# Patient Record
Sex: Female | Born: 1995 | Race: Black or African American | Hispanic: No | Marital: Single | Smoking: Never smoker
Health system: Southern US, Community
[De-identification: ages and names within clinical notes are randomized; demographics above are authoritative.]

## PROBLEM LIST (undated history)

## (undated) DIAGNOSIS — I10 Essential (primary) hypertension: Secondary | ICD-10-CM

## (undated) DIAGNOSIS — E669 Obesity, unspecified: Secondary | ICD-10-CM

## (undated) DIAGNOSIS — T7840XA Allergy, unspecified, initial encounter: Secondary | ICD-10-CM

## (undated) DIAGNOSIS — J45909 Unspecified asthma, uncomplicated: Secondary | ICD-10-CM

## (undated) DIAGNOSIS — L309 Dermatitis, unspecified: Secondary | ICD-10-CM

## (undated) HISTORY — PX: MOUTH SURGERY: SHX715

---
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
## 2015-12-26 ENCOUNTER — Emergency Department (HOSPITAL_COMMUNITY)
Admission: EM | Admit: 2015-12-26 | Discharge: 2015-12-26 | Disposition: A | Discharge status: Home / Self Care | Payer: Medicaid Other | Attending: Emergency Medicine | Admitting: Emergency Medicine

## 2015-12-26 ENCOUNTER — Encounter (HOSPITAL_COMMUNITY): Payer: Self-pay | Admitting: Nurse Practitioner

## 2015-12-26 ENCOUNTER — Emergency Department (HOSPITAL_BASED_OUTPATIENT_CLINIC_OR_DEPARTMENT_OTHER): Admit: 2015-12-26 | Discharge: 2015-12-26 | Disposition: A | Discharge status: Home / Self Care | Payer: Medicaid Other

## 2015-12-26 DIAGNOSIS — R21 Rash and other nonspecific skin eruption: Secondary | ICD-10-CM

## 2015-12-26 DIAGNOSIS — M7989 Other specified soft tissue disorders: Secondary | ICD-10-CM | POA: Diagnosis not present

## 2015-12-26 DIAGNOSIS — R000 Tachycardia, unspecified: Secondary | ICD-10-CM | POA: Insufficient documentation

## 2015-12-26 DIAGNOSIS — M79609 Pain in unspecified limb: Secondary | ICD-10-CM | POA: Diagnosis not present

## 2015-12-26 HISTORY — DX: Dermatitis, unspecified: L30.9

## 2015-12-26 MED ORDER — TRIAMCINOLONE ACETONIDE 0.1 % EX CREA: 1.0000 "application " | TOPICAL_CREAM | Freq: Two times a day (BID) | CUTANEOUS | Status: DC

## 2015-12-26 NOTE — ED Provider Notes (Signed)
CSN: [id number]     Arrival date & time 12/26/15  1527 History  By signing my name below, I, [facility], attest that this documentation has been prepared under the direction and in the presence of Amber MeresAshley Leigh Kaeding, PA-C. Electronically Signed: Randell PatientMarrissa Arroyo, ED Scribe. 12/26/2015. 6:08 PM.  Chief Complaint  Arroyo presents with  . Skin Problem    The history is provided by the Arroyo. No language interpreter was used.   HPI Comments: Amber Arroyo is a 20 y.o. female who presents to the Emergency Department complaining of constant, waxing and waning, pruritic, scaly rash ongoing for months, worse in the past week on her lower extremities - worse on right compared to left. She describes the rash as dry, scaly, and erythematous. Pt states that she has eczema with recurrent flare-ups in the summer months but has not been able to follow-up with a dermatologist due to her provider moving away. She reports small breaks in her skin; however, denies any purulent discharge. She notes that the rash worsens from pruritic to a burning sensation when she showers and applies Gold-Bond topical cream. She has taken Aleve. She notes similar symptoms in the past that improved with triamcinolone but has been unable to refill this prescription. Per pt, she has switched lotions multiple times over the past few weeks.  Of note, She reports associated painful, bilateral lower leg edema, worse on the right, She frequently stands for long periods of time while at work. Denies recent injuries, long-distance travel, hospitalizations, surgeries, hormone therapy, or tick bites. Denies hx of cancer, DVT, or PE. No OCPs. Denies cough, hemoptysis, SOB, CP, fevers, chills, and night diaphoresis.  Past Medical History  Diagnosis Date  . Obesity   . Allergy     eczema, seasonal allergies  . Eczema    Past Surgical History  Procedure Laterality Date  . Mouth surgery      Wisdom Teeth Extraction  . Incision and drainage  abscess Left 02/04/2013    Procedure: INCISION AND DRAINAGE POSTERIOR NECK ABSCESS;  Surgeon: Amber ShanksKarol Wolicki, MD;  [facility];  Service: ENT;  Laterality: Left;   Family History  Problem Relation Age of Onset  . Hypertension Mother   . Asthma Mother   . Eczema Mother   . Hypertension Father    Social History  Substance Use Topics  . Smoking status: Never Smoker   . Smokeless tobacco: Never Used  . Alcohol Use: No   OB History    No data available     Review of Systems  Constitutional: Negative for fever, chills and diaphoresis.  Respiratory: Negative for cough and shortness of breath.   Cardiovascular: Positive for leg swelling. Negative for chest pain.  Musculoskeletal: Positive for myalgias ( right lower extremity).  Skin: Positive for color change, rash and wound.      Allergies  Review of Arroyo's allergies indicates no known allergies.  Home Medications   Prior to Admission medications   Medication Sig Start Date End Date Taking? Authorizing Provider  acetaminophen (TYLENOL) 325 MG tablet Take 2 tablets (650 mg total) by mouth every 6 (six) hours as needed (mild pain, fever >100.4). 02/06/13   Tiana LoftMelissa Cabellon, MD  clindamycin (CLEOCIN) 300 MG capsule Take 2 capsules (600 mg total) by mouth every 8 (eight) hours. 02/06/13   Tiana LoftMelissa Cabellon, MD  fluticasone (FLONASE) 50 MCG/ACT nasal spray Place 2 sprays into the nose daily. 11/02/12   Sharene SkeansShad Baab, MD  loratadine (CLARITIN) 10 MG tablet Take 10  mg by mouth daily.    Historical Provider, MD  oxyCODONE (OXY IR/ROXICODONE) 5 MG immediate release tablet Take 1 tablet (5 mg total) by mouth every 6 (six) hours as needed. 02/06/13   Tiana LoftMelissa Cabellon, MD  triamcinolone cream (KENALOG) 0.1 % Apply 1 application topically 2 (two) times daily. 12/26/15   Lona KettleAshley Laurel Marshelle Bilger, PA-C   BP 135/79 mmHg  Pulse 105  Temp(Src) 98.6 F (37 C) (Oral)  Resp 20  SpO2 98% Physical Exam  Constitutional: She appears well-developed and  well-nourished. No distress.  HENT:  Head: Normocephalic and atraumatic.  Eyes: Conjunctivae are normal. No scleral icterus.  Neck: Normal range of motion.  Pulmonary/Chest: Effort normal. No respiratory distress.  Musculoskeletal: She exhibits edema and tenderness.  B/l leg swelling, right > left. Posterior right calf tenderness.   Neurological: She is alert.  Skin: Skin is warm and dry. She is not diaphoretic. There is erythema.  Bilateral dry, scaly plaques with lichenification from the feet to the ankles with some breaks in skin. Few plaques on the bilateral hands. No obvious signs of infection. Cap refill less than 2 seconds.  Psychiatric: She has a normal mood and affect. Her behavior is normal.    ED Course  Procedures   DIAGNOSTIC STUDIES: Oxygen Saturation is 99% on RA, normal by my interpretation.    COORDINATION OF CARE: 4:02 PM Will prescribe triamcinolone cream. Will order vascular US of right lower leg to r/o DVT. Discussed treatment plan with pt at bedside and pt agreed to plan.  6:07 PM Reviewed results of right lower leg imaging. Returned to discuss results with pt. Will discharge pt.   MDM   Final diagnoses:  Rash    Pt is afebrile and non-toxic appearing in NAD. She is tachycardic, vital signs otherwise stable. Physical exam remarkable for b/l lower extremity swelling, right > left with pain in posterior calf - lower extremity doppler negative for DVT. Physical exam remarkable for eczematous plaques on feet b/l. No purulent discharge. Neurovascularly intact. Suspect eczematous flare. Discussed symptomatic management to include topical skin barriers, benadryl for itch relief, and Rx triamcinolone cream. Encouraged pt to establish a PCP and provided resources. Return precautions discussed. Pt voiced understanding and is agreeable.    I personally performed the services described in this documentation, which was scribed in my presence. The recorded information has  been reviewed and is accurate.    Lona Kettleshley Laurel Jonan Seufert, PA-C 12/27/15 0234  Richardean Canalavid H Yao, MD 12/27/15 1140

## 2015-12-26 NOTE — Discharge Instructions (Signed)
Read the information below.   Your imaging study of your lower extremity with negative for a blood clot. However, it did show some enlarged lymph nodes. It is important that you have this further evaluated. I encourage you to follow up with a primary care provider. I have provided the contact information for [facility] as well as other resources for local providers. Please call to schedule a follow up appointment.  Be sure to avoid triggers that cause your eczema to flare up. You can take over the counter benadryl for relief of itching.  Keep your skin clean an dry and apply topical  I have prescribed triamcinolone cream. Apply to affected areas twice daily.  Use the prescribed medication as directed.  Please discuss all new medications with your pharmacist.   You may return to the Emergency Department at any time for worsening condition or any new symptoms that concern you. Return to ED if your symptoms worsen, you develop fever, you have purulent drainage, or develop open sores.    [employer] Resource Guide Financial Assistance The United Ways 211 is a great source of information about community services available.  Access by dialing 2-1-1 from anywhere in [place], or by website -  PooledIncome.plwww.nc211.org.   Other Local Resources (Updated 06/2015)  Financial Assistance   Services    Phone Number and Address  [facility]  Low-cost medical care - 1st and 3rd Saturday of every month  Must not qualify for public or private insurance and must have limited income [phone] [address] [place], [place]    [place] [facility] Social Services  Child care  Emergency assistance for housing and Kimberly-Clarkutilities  Food stamps  Medicaid [phone] [address] [place], [place] [address]   [facility]  Low-cost medical care for children, communicable diseases, sexually-transmitted diseases, immunizations, maternity  care, womens health and family planning [phone] [address] [place], [place] [address]  [facility]   Medication assistance for [place] [place] residents  Must meet income requirements [phone] [address] [place], [place].    [facility]  Child care  Emergency assistance for housing and Kimberly-Clarkutilities  Food stamps  Medicaid [phone] [address] [place], [place] [address]  [facility]   Low-cost medical care,   Monday through Friday, 9 am to 6 pm.   Accepts Medicare/Medicaid, and self-pay [phone] [place], [place] [address]  [facility]  Low-cost medical care - Monday through Friday, 8:30 am - 5:30 pm  Accepts Medicaid and self-pay [phone] [address] [place], [place] [address]   [facility]  Primary medical care, including for those with sickle cell disease  Accepts Medicare, Medicaid, insurance and self-pay [phone] [address] [place], [place]  [facility]   Primary medical care  Accepts Medicare, [place], insurance and self-pay [phone] [address] [place], [place] [address]   [facility] of Social Services  Child care  Emergency assistance for housing and Kimberly-Clarkutilities  Food stamps  Medicaid [phone] [address] [place], [place] [address]  [facility] of Health and [employer]Human Services  Child care  Emergency assistance for housing and Kimberly-Clarkutilities  Food stamps  Medicaid [phone] [address] [place], [place] [address]   [facility] Med[facility]  Medication assistance for [facility] residents with no insurance  only  Must have a primary care doctor [phone] [address], [address] [place], [place]  [facility]    Primary medical care  [place] Medicare, [place]icaid, insurance  [phone] [address] [place], [place]  [facility]ist   Medication assistance [phone]  [facility]   Primary medical care  Accepts Medicare, [place]icaid, insurance and self-pay [phone] [address] [place], [place] [address]  [facility]   Primary medical care  Accepts Medicare, [place]icaid, insurance and self-pay [phone] [address] [place], [place] [address]  [facility]  For [place] residents between the ages of 3518 and 5164 who do not have any form of health insurance, Medicare, [place]icaid, or [place] benefits.  Services are provided free of charge to uninsured patients who fall within federal poverty guidelines.    Hours: Tuesdays and Thursdays, 4:15 - 8 pm [phone] [address], [address] [place], [place] [address]  [facility]     Primary medical care  Dental care  Nutritional counseling  Pharmacy  Accepts Medicaid, Medicare, most insurance.  Fees are adjusted based on ability to pay.   [phone] [facility] [address] [place], [place]  [phone] [facility] [address] [place], [place]  [phone] [facility] [place], [place]  [phone] [facility], [address] [place], [place]  [phone] [facility] [address] [place], [place]  [facility]  Womens health and family planning [phone] [address] [place], [place]  [facility] Department of Social Services  Child care  Emergency assistance for housing and Kimberly-Clarkutilities  Food stamps  Medicaid [phone] [address], [place], [place] [address]   [facility]    Ages 2918 and older  Hours: Mondays and Thursdays, 7:00 am - 9:00 am Patients are seen on a first come,  first served basis. [phone], [phone] [place], [place]  [facility] Division of Social Services  Child care  Emergency assistance for housing and Kimberly-Clarkutilities  Food stamps  Medicaid [phone] [address] [place], [place] [address]  [facility] [employer]  Medication assistance  Rental assistance  Food pantry  Medication assistance  Housing assistance  Emergency food distribution  Utility assistance [phone] [address] [place], [place]  [phone]  [address] [place], [place] [address] Hours: Tuesdays and Thursdays from 9am - 12 noon by appointment only  [phone] [address] [place], [place] [address]  [facility] - Lanae Boastlara F. Gunn   Accepts private insurance, [place] IslandMedicare, and [place]icaid.  Payment is based on a sliding scale for those without insurance.  Hours: Mondays, Tuesdays and Thursdays, 8:30 am - 5:30 pm.   [phone] [address] [place], [place]  [facility] - Family Medicine at [facility]    Accepts private insurance, [place]icare, and [place]icaid.  Payment is based on a sliding scale for those without insurance. [phone] [address] [place], [place]  [facility] - Pediatrics at E. Scientist, research (physical sciences)[place]  Accepts private insurance, [employer] EntertainmentMedicare, and [place]icaid.  Payment is based on a sliding scale for those without insurance [phone] [address], [place], [place]  [facility] - Pediatrics at [place]  Accepts private insurance, [place]icare, and [place]icaid.  Payment is based on a sliding scale for those without insurance. [phone] [address] [place], [place]  [facility] - Pediatrics at [facility], [place], and [place].  Payment is based on a sliding scale for those without insurance. [phone], [phone] [place], [place].     [facility]  Maternity care.  Accepts Medicaid and self-pay. [phone] [address] [place], [place]

## 2015-12-26 NOTE — ED Notes (Signed)
Pt c/o 1 week history of skin irritation, swelling, dryness, redness. She has hx eczema but can not go to dermatologist due to insurance issues. She has been applying vaseline with no relief.

## 2015-12-26 NOTE — Progress Notes (Signed)
VASCULAR LAB PRELIMINARY  PRELIMINARY  PRELIMINARY  PRELIMINARY  Right lower extremity venous duplex completed.    Preliminary report:  There is no DVT or SVT noted in the right lower extremity.  There are a significant number of enlarged lymph nodes noted throughout the right groin and proximal thigh.    Carlena Ruybal, RVT 12/26/2015, 5:02 PM

## 2016-01-01 ENCOUNTER — Emergency Department (HOSPITAL_BASED_OUTPATIENT_CLINIC_OR_DEPARTMENT_OTHER): Admit: 2016-01-01 | Discharge: 2016-01-01 | Disposition: A | Discharge status: Home / Self Care | Payer: Medicaid Other

## 2016-01-01 ENCOUNTER — Emergency Department (HOSPITAL_COMMUNITY): Payer: Medicaid Other

## 2016-01-01 ENCOUNTER — Encounter (HOSPITAL_COMMUNITY): Payer: Self-pay

## 2016-01-01 ENCOUNTER — Emergency Department (HOSPITAL_COMMUNITY)
Admission: EM | Admit: 2016-01-01 | Discharge: 2016-01-01 | Disposition: A | Discharge status: Home / Self Care | Payer: Medicaid Other | Attending: Emergency Medicine | Admitting: Emergency Medicine

## 2016-01-01 DIAGNOSIS — M7989 Other specified soft tissue disorders: Secondary | ICD-10-CM

## 2016-01-01 LAB — CBC WITH DIFFERENTIAL/PLATELET
BASOS PCT: 0 %
Basophils Absolute: 0 10*3/uL (ref 0.0–0.1)
Eosinophils Absolute: 0.2 10*3/uL (ref 0.0–0.7)
Eosinophils Relative: 3 %
HEMATOCRIT: 32.1 % — AB (ref 36.0–46.0)
HEMOGLOBIN: 10.1 g/dL — AB (ref 12.0–15.0)
LYMPHS ABS: 1.4 10*3/uL (ref 0.7–4.0)
Lymphocytes Relative: 16 %
MCH: 25.6 pg — AB (ref 26.0–34.0)
MCHC: 31.5 g/dL (ref 30.0–36.0)
MCV: 81.5 fL (ref 78.0–100.0)
MONO ABS: 1.1 10*3/uL — AB (ref 0.1–1.0)
MONOS PCT: 13 %
NEUTROS ABS: 5.8 10*3/uL (ref 1.7–7.7)
Neutrophils Relative %: 68 %
Platelets: 350 10*3/uL (ref 150–400)
RBC: 3.94 MIL/uL (ref 3.87–5.11)
RDW: 15.2 % (ref 11.5–15.5)
WBC: 8.5 10*3/uL (ref 4.0–10.5)

## 2016-01-01 LAB — COMPREHENSIVE METABOLIC PANEL
ALBUMIN: 2.9 g/dL — AB (ref 3.5–5.0)
ALK PHOS: 47 U/L (ref 38–126)
ALT: 26 U/L (ref 14–54)
ANION GAP: 7 (ref 5–15)
AST: 26 U/L (ref 15–41)
BUN: 13 mg/dL (ref 6–20)
CALCIUM: 8.8 mg/dL — AB (ref 8.9–10.3)
CHLORIDE: 105 mmol/L (ref 101–111)
CO2: 26 mmol/L (ref 22–32)
Creatinine, Ser: 0.72 mg/dL (ref 0.44–1.00)
GFR calc Af Amer: >60 mL/min (ref >60)
GFR calc non Af Amer: >60 mL/min (ref >60)
GLUCOSE: 84 mg/dL (ref 65–99)
Potassium: 3.8 mmol/L (ref 3.5–5.1)
SODIUM: 138 mmol/L (ref 135–145)
Total Bilirubin: 0.6 mg/dL (ref 0.3–1.2)
Total Protein: 7.2 g/dL (ref 6.5–8.1)

## 2016-01-01 LAB — URINALYSIS, ROUTINE W REFLEX MICROSCOPIC
Bilirubin Urine: NEGATIVE
GLUCOSE, UA: NEGATIVE mg/dL
HGB URINE DIPSTICK: NEGATIVE
Ketones, ur: NEGATIVE mg/dL
LEUKOCYTES UA: NEGATIVE
Nitrite: NEGATIVE
PH: 6.5 (ref 5.0–8.0)
Protein, ur: NEGATIVE mg/dL
SPECIFIC GRAVITY, URINE: 1.028 (ref 1.005–1.030)

## 2016-01-01 LAB — POC URINE PREG, ED: PREG TEST UR: NEGATIVE

## 2016-01-01 LAB — I-STAT CG4 LACTIC ACID, ED: Lactic Acid, Venous: 1.34 mmol/L (ref 0.5–1.9)

## 2016-01-01 MED ORDER — CEPHALEXIN 250 MG PO CAPS
500.0000 mg | ORAL_CAPSULE | Freq: Once | ORAL | Status: AC
  Administered 2016-01-01: 500 mg via ORAL
  Filled 2016-01-01: qty 2

## 2016-01-01 MED ORDER — NAPROXEN 500 MG PO TABS: 500.0000 mg | ORAL_TABLET | Freq: Two times a day (BID) | ORAL | Status: DC

## 2016-01-01 MED ORDER — CEPHALEXIN 500 MG PO CAPS: 500.0000 mg | ORAL_CAPSULE | Freq: Four times a day (QID) | ORAL | Status: DC

## 2016-01-01 NOTE — Progress Notes (Signed)
*  Preliminary Results* Bilateral lower extremity venous duplex completed. Study was very technically difficult and limited due to poor patient cooperation and patient body habitus. Unable to perform compression maneuvers due to poor patient cooperation. There is no obvious evidence of deep vein thrombosis involving the visualized veins of bilateral lower extremities by color doppler.   Incidental finding: There are multiple heterogenous areas of bilateral groins, largest measuring 4.9cm, suggestive of possible enlarged lymph nodes.  Preliminary results discussed with Harolyn RutherfordShawn Joy, PA-C.  01/01/2016  Gertie FeyMichelle Peggyann Zwiefelhofer, RVT, RDCS, RDMS

## 2016-01-01 NOTE — ED Provider Notes (Signed)
CSN: [id number]     Arrival date & time 01/01/16  1350 History   First MD Initiated Contact with Patient 01/01/16 1528     Chief Complaint  Patient presents with  . Leg Swelling     (Consider location/radiation/quality/duration/timing/severity/associated sxs/prior Treatment) HPI    Amber Arroyo is a 20 y.o. female, with a history of Eczema and obesity, presenting to the ED with Leg swelling, increasing over the last week. Patient states that her right leg began to swell and turn red, worse than the left. Patient has a history of eczema and states that the redness originally started with increased itching on both her legs. Over the last week, patient states her leg stopped itching and her right one became painful. The swelling and redness continues to increase in the right leg. Patient was evaluated on July 6, had a negative duplex ultrasound, diagnosed with eczema flare, and prescribed triamcinolone. Patient was encouraged to seek out care from a PCP at that time, but did not do so. Patient denies neuro deficits, falls/trauma, fever/chills, or any other complaints.     Past Medical History  Diagnosis Date  . Obesity   . Allergy     eczema, seasonal allergies  . Eczema    Past Surgical History  Procedure Laterality Date  . Mouth surgery      Wisdom Teeth Extraction  . Incision and drainage abscess Left 02/04/2013    Procedure: INCISION AND DRAINAGE POSTERIOR NECK ABSCESS;  Surgeon: Flo ShanksKarol Wolicki, MD;  [facility];  Service: ENT;  Laterality: Left;   Family History  Problem Relation Age of Onset  . Hypertension Mother   . Asthma Mother   . Eczema Mother   . Hypertension Father    Social History  Substance Use Topics  . Smoking status: Never Smoker   . Smokeless tobacco: Never Used  . Alcohol Use: No   OB History    No data available     Review of Systems  Constitutional: Negative for fever, chills and diaphoresis.  Respiratory: Negative for shortness of breath.    Cardiovascular: Positive for leg swelling. Negative for chest pain.  Gastrointestinal: Negative for abdominal pain.  Genitourinary: Negative for flank pain and difficulty urinating.  Musculoskeletal: Positive for myalgias (right leg). Negative for back pain and neck pain.  Skin: Negative for color change and pallor.  Neurological: Negative for dizziness, syncope, weakness, light-headedness and headaches.  All other systems reviewed and are negative.     Allergies  Review of patient's allergies indicates no known allergies.  Home Medications   Prior to Admission medications   Medication Sig Start Date End Date Taking? Authorizing Provider  acetaminophen (TYLENOL) 325 MG tablet Take 2 tablets (650 mg total) by mouth every 6 (six) hours as needed (mild pain, fever >100.4). 02/06/13   Tiana LoftMelissa Cabellon, MD  cephALEXin (KEFLEX) 500 MG capsule Take 1 capsule (500 mg total) by mouth 4 (four) times daily. 01/01/16   Shawn C Joy, PA-C  clindamycin (CLEOCIN) 300 MG capsule Take 2 capsules (600 mg total) by mouth every 8 (eight) hours. 02/06/13   Tiana LoftMelissa Cabellon, MD  fluticasone (FLONASE) 50 MCG/ACT nasal spray Place 2 sprays into the nose daily. 11/02/12   Sharene SkeansShad Baab, MD  loratadine (CLARITIN) 10 MG tablet Take 10 mg by mouth daily.    Historical Provider, MD  naproxen (NAPROSYN) 500 MG tablet Take 1 tablet (500 mg total) by mouth 2 (two) times daily. 01/01/16   Anselm PancoastShawn C Joy, PA-C  oxyCODONE (  OXY IR/ROXICODONE) 5 MG immediate release tablet Take 1 tablet (5 mg total) by mouth every 6 (six) hours as needed. 02/06/13   Tiana Loft, MD  triamcinolone cream (KENALOG) 0.1 % Apply 1 application topically 2 (two) times daily. 12/26/15   Lona Kettle, PA-C   BP 141/85 mmHg  Pulse 60  Temp(Src) 97.8 F (36.6 C) (Oral)  Resp 18  Ht 5\' 3"  (1.6 m)  Wt 108.228 kg  BMI 42.28 kg/m2  SpO2 100% Physical Exam  Constitutional: She appears well-developed and well-nourished. No distress.  HENT:  Head:  Normocephalic and atraumatic.  Eyes: Conjunctivae are normal.  Neck: Neck supple.  Cardiovascular: Normal rate, regular rhythm, normal heart sounds and intact distal pulses.   Pulmonary/Chest: Effort normal and breath sounds normal. No respiratory distress.  Abdominal: Soft. There is no tenderness. There is no guarding.  Musculoskeletal: She exhibits edema and tenderness.  Severe, pitting edema in the right foot and lower leg. Erythema and tenderness extends from the right foot into the right thigh. Full active and passive range of motion bilaterally.  Lymphadenopathy:    She has no cervical adenopathy.  Neurological: She is alert. She has normal reflexes.  No sensory deficits. Strength 5/5 in all extremities.   Skin: Skin is warm and dry. She is not diaphoretic.  Psychiatric: She has a normal mood and affect. Her behavior is normal.  Nursing note and vitals reviewed.   ED Course  Procedures (including critical care time) Labs Review Labs Reviewed  COMPREHENSIVE METABOLIC PANEL - Abnormal; Notable for the following:    Calcium 8.8 (*)    Albumin 2.9 (*)    All other components within normal limits  CBC WITH DIFFERENTIAL/PLATELET - Abnormal; Notable for the following:    Hemoglobin 10.1 (*)    HCT 32.1 (*)    MCH 25.6 (*)    Monocytes Absolute 1.1 (*)    All other components within normal limits  URINALYSIS, ROUTINE W REFLEX MICROSCOPIC (NOT AT [facility])  I-STAT CG4 LACTIC ACID, ED  POC URINE PREG, ED    Imaging Review Dg Tibia/fibula Right  01/01/2016  CLINICAL DATA:  Increasing edema, redness and itching in RIGHT lower leg and foot for 2 weeks, history ax stoma EXAM: RIGHT TIBIA AND FIBULA - 2 VIEW COMPARISON:  None FINDINGS: Diffuse soft tissue swelling. Osseous mineralization normal. Knee and ankle joint alignments normal. No acute fracture, dislocation, or bone destruction. IMPRESSION: Diffuse soft tissue swelling. No acute osseous abnormalities. Electronically Signed   By: Ulyses Southward M.D.   On: 01/01/2016 17:09   Dg Foot Complete Right  01/01/2016  CLINICAL DATA:  20 year old female with right foot pain and swelling for 2 weeks. EXAM: RIGHT FOOT COMPLETE - 3+ VIEW COMPARISON:  None. FINDINGS: Diffuse soft tissue swelling is noted. There is no evidence of fracture, subluxation or dislocation. No focal bony lesions are identified. No radiopaque foreign bodies are noted. IMPRESSION: Soft tissue swelling without bony abnormality. Electronically Signed   By: Harmon Pier M.D.   On: 01/01/2016 17:05   I have personally reviewed and evaluated these images and lab results as part of my medical decision-making.   EKG Interpretation None      MDM   Final diagnoses:  Leg swelling    Amber Arroyo presents with leg swelling, right more than the left.  Findings and plan of care discussed with Raeford Razor, MD and then with Dr. Verdie Mosher after EDP shift change.  Pt has large inguinal lymph nodes  bilaterally noted on ultrasound. A trial of antibiotics for possible cellulitis and patient will need close PCP follow-up. Needs case management for assistance in securing a PCP. Appointment was set up for the patient for next week. The importance of keeping this appointment was stressed to the patient. Return precautions discussed. Patient voiced understanding of her instructions, agrees to the plan, and is comfortable with discharge.  Filed Vitals:   01/01/16 1545 01/01/16 1615 01/01/16 1737 01/01/16 1745  BP: 140/80 131/74 133/70 126/78  Pulse: 62 69 79   Temp:      TempSrc:      Resp:   18   Height:      Weight:      SpO2: 100% 100% 100%       Anselm Pancoast, PA-C 01/01/16 1900  Lavera Guise, MD 01/02/16 1145

## 2016-01-01 NOTE — ED Notes (Signed)
Patient here for further evaluation of right leg swelling. Seen on the 6th for same and had doppler and was negative for DVT. Denies trauma. Pain with ambulation

## 2016-01-01 NOTE — Discharge Instructions (Signed)
You have been seen today for leg swelling. There were no blood clots noted on your ultrasound, but abnormally large lymph nodes were found. You will need to follow up with a PCP for further evaluation and management. Keep the appointment set up for you on Tuesday 7/18 at 11:30 with Dr. Venetia NightAmao.  You are also being prescribed an antibiotic to treat possible cellulitis, or an infection in the skin. Please take all of your antibiotics until finished!   You may develop abdominal discomfort or diarrhea from the antibiotic.  You may help offset this with probiotics which you can buy or get in yogurt. Do not eat or take the probiotics until 2 hours after your antibiotic.  Return to ED should symptoms worsen.

## 2016-01-07 ENCOUNTER — Encounter: Payer: Self-pay | Admitting: Family Medicine

## 2016-01-07 ENCOUNTER — Ambulatory Visit: Payer: Medicaid Other | Attending: Family Medicine | Admitting: Family Medicine

## 2016-01-07 VITALS — BP 121/78 | HR 91 | Temp 97.6°F | Resp 16 | Ht 63.0 in | Wt 232.8 lb

## 2016-01-07 DIAGNOSIS — E669 Obesity, unspecified: Secondary | ICD-10-CM | POA: Diagnosis not present

## 2016-01-07 DIAGNOSIS — I890 Lymphedema, not elsewhere classified: Secondary | ICD-10-CM | POA: Insufficient documentation

## 2016-01-07 DIAGNOSIS — L309 Dermatitis, unspecified: Secondary | ICD-10-CM | POA: Insufficient documentation

## 2016-01-07 MED ORDER — TRIAMCINOLONE ACETONIDE 0.1 % EX CREA: 1.0000 "application " | TOPICAL_CREAM | Freq: Two times a day (BID) | CUTANEOUS | Status: DC

## 2016-01-07 MED ORDER — FUROSEMIDE 20 MG PO TABS: 20.0000 mg | ORAL_TABLET | Freq: Every day | ORAL | Status: DC

## 2016-01-07 NOTE — Progress Notes (Signed)
Subjective:  Patient ID: Amber Arroyo, female    DOB: 06-20-1996  Age: 20 y.o. MRN: [id number]  CC: Follow-up   HPI Amber Arroyo is a 20 year old female with a history of obesity, eczema who presented to the ED with right lower extremity swelling and lower extremity Doppler performed on 2 different occasions one week apart were negative for DVT but revealed findings suggestive of lymphadenopathy in the groin. She was placed on Keflex and prescribed analgesics which is still taking.  Complains of pain in the right leg with some edema which has improved. Pain is a 5/10. She also has severe eczema in her lower and upper extremity with severely pruritic lesions; triamcinolone does not help much.  Past Medical History  Diagnosis Date  . Obesity   . Allergy     eczema, seasonal allergies  . Eczema     Past Surgical History  Procedure Laterality Date  . Mouth surgery      Wisdom Teeth Extraction  . Incision and drainage abscess Left 02/04/2013    Procedure: INCISION AND DRAINAGE POSTERIOR NECK ABSCESS;  Surgeon: Flo Shanks, MD;  Location: [facility] OR;  Service: ENT;  Laterality: Left;      Outpatient Prescriptions Prior to Visit  Medication Sig Dispense Refill  . cephALEXin (KEFLEX) 500 MG capsule Take 1 capsule (500 mg total) by mouth 4 (four) times daily. 40 capsule 0  . naproxen (NAPROSYN) 500 MG tablet Take 1 tablet (500 mg total) by mouth 2 (two) times daily. 30 tablet 0  . triamcinolone cream (KENALOG) 0.1 % Apply 1 application topically 2 (two) times daily. 30 g 0  . fluticasone (FLONASE) 50 MCG/ACT nasal spray Place 2 sprays into the nose daily. (Patient not taking: Reported on 01/07/2016) 16 g 2  . loratadine (CLARITIN) 10 MG tablet Take 10 mg by mouth daily. Reported on 01/07/2016    . acetaminophen (TYLENOL) 325 MG tablet Take 2 tablets (650 mg total) by mouth every 6 (six) hours as needed (mild pain, fever >100.4). (Patient not taking: Reported on 01/07/2016)    . clindamycin  (CLEOCIN) 300 MG capsule Take 2 capsules (600 mg total) by mouth every 8 (eight) hours. (Patient not taking: Reported on 01/07/2016) 21 capsule 0  . oxyCODONE (OXY IR/ROXICODONE) 5 MG immediate release tablet Take 1 tablet (5 mg total) by mouth every 6 (six) hours as needed. (Patient not taking: Reported on 01/07/2016) 5 tablet 0   No facility-administered medications prior to visit.    ROS Review of Systems  Constitutional: Negative for activity change and appetite change.  HENT: Negative for sinus pressure and sore throat.   Respiratory: Negative for chest tightness, shortness of breath and wheezing.   Cardiovascular: Negative for chest pain and palpitations.  Gastrointestinal: Negative for abdominal pain, constipation and abdominal distention.  Genitourinary: Negative.   Musculoskeletal:       See hpi  Psychiatric/Behavioral: Negative for behavioral problems and dysphoric mood.    Objective:  BP 121/78 mmHg  Pulse 91  Temp(Src) 97.6 F (36.4 C) (Oral)  Resp 16  Ht  (1.6 m)  Wt 232 lb 12.8 oz (105.597 kg)  BMI 41.25 kg/m2  SpO2 100%  LMP 12/20/2015  BP/Weight 01/07/2016 01/01/2016 12/26/2015  Systolic BP 121 115 135  Diastolic BP 78 72 79  Wt. (Lbs) 232.8 238.6 -  BMI 41.25 42.28 -      Physical Exam  Constitutional: She is oriented to person, place, and time. She appears well-developed and well-nourished.  Cardiovascular:  Normal rate, normal heart sounds and intact distal pulses.   No murmur heard. Pulmonary/Chest: Effort normal and breath sounds normal. She has no wheezes. She has no rales. She exhibits no tenderness.  Abdominal: Soft. Bowel sounds are normal. She exhibits no distension and no mass. There is no tenderness.  Musculoskeletal: She exhibits edema (right leg lymphedema with no erythema). She exhibits no tenderness.  Neurological: She is alert and oriented to person, place, and time.  Skin:  Eczematous lesion in flexor aspects of the ankle, elbows, hands  and dorsum of feet     Assessment & Plan:   1. Eczema Advised against using scented products Skin care discussed We'll reassess for improvement at next visit - triamcinolone cream (KENALOG) 0.1 %; Apply 1 application topically 2 (two) times daily.  Dispense: 453.6 g; Refill: 1  2. Lymphedema Discussed weight loss, modalities to decrease pedal edema including use of compression socks and low-sodium diet. Advised to complete course of Keflex. - furosemide (LASIX) 20 MG tablet; Take 1 tablet (20 mg total) by mouth daily.  Dispense: 5 tablet; Refill: 0  3. Obesity Discussed weight loss, reducing portion sizes and exercise   Meds ordered this encounter  Medications  . triamcinolone cream (KENALOG) 0.1 %    Sig: Apply 1 application topically 2 (two) times daily.    Dispense:  453.6 g    Refill:  1  . furosemide (LASIX) 20 MG tablet    Sig: Take 1 tablet (20 mg total) by mouth daily.    Dispense:  5 tablet    Refill:  0    Follow-up: Return in about 1 month (around 02/07/2016) for Follow-up on eczema.   Jaclyn ShaggyEnobong Amao MD

## 2016-01-07 NOTE — Progress Notes (Signed)
Pt here for hospital F/U. Pt reports pain in right leg rated at a 5. Pt has taken medications today.

## 2016-01-07 NOTE — Patient Instructions (Signed)
Lymphedema  Lymphedema is swelling that is caused by the abnormal collection of lymph under the skin. Lymph is fluid from the tissues in your body that travels in the lymphatic system. This system is part of the immune system and includes lymph nodes and lymph vessels. The lymph vessels collect and carry the excess fluid, fats, proteins, and wastes from the tissues of the body to the bloodstream. This system also works to clean and remove bacteria and waste products from the body.  Lymphedema occurs when the lymphatic system is blocked. When the lymph vessels or lymph nodes are blocked or damaged, lymph does not drain properly, causing an abnormal buildup of lymph. This leads to swelling in the arms or legs. Lymphedema cannot be cured by medicines, but various methods can be used to help reduce the swelling.  CAUSES  There are two types of lymphedema. Primary lymphedema is caused by the absence or abnormality of the lymph vessel at birth. Secondary lymphedema is more common. It occurs when the lymph vessel is damaged or blocked. Common causes of lymph vessel blockage include:  · Skin infection, such as cellulitis.  · Infection by parasites (filariasis).  · Injury.  · Cancer.  · Radiation therapy.  · Formation of scar tissue.  · Surgery.  SYMPTOMS  Symptoms of this condition include:  · Swelling of the arm or leg.  · A heavy or tight feeling in the arm or leg.  · Swelling of the feet, toes, or fingers. Shoes or rings may fit more tightly than before.  · Redness of the skin over the affected area.  · Limited movement of the affected limb.  · Sensitivity to touch or discomfort in the affected limb.  DIAGNOSIS  This condition may be diagnosed with:  · A physical exam.  · Medical history.  · Imaging tests, such as:    Lymphoscintigraphy. In this test, a low dose of a radioactive substance is injected to trace the flow of lymph through the lymph vessels.    MRI.    CT scan.    Duplex ultrasound. This test uses sound waves  to produce images of the vessels and the blood flow on a screen.    Lymphangiography. In this test, a contrast dye is injected into the lymph vessel to help show blockages.  TREATMENT  Treatment for this condition may depend on the cause. Treatment may include:  · Exercise. Certain exercises can help fluid move out of the affected limb.  · Massage. Gentle massage of the affected limb can help move the fluid out of the area.  · Compression. Various methods may be used to apply pressure to the affected limb in order to reduce the swelling.    Wearing compression stockings or sleeves on the affected limb.    Bandaging the affected limb.    Using an external pump that is attached to a sleeve that alternates between applying pressure and releasing pressure.  · Surgery. This is usually only done for severe cases. For example, surgery may be done if you have trouble moving the limb or if the swelling does not get better with other treatments.  If an underlying condition is causing the lymphedema, treatment for that condition is needed. For example, antibiotic medicines may be used to treat an infection.  HOME CARE INSTRUCTIONS  Activities  · Exercise regularly as directed by your health care provider.  · Do not sit with your legs crossed.  · When possible, keep the affected limb   raised (elevated) above the level of your heart.  · Avoid carrying things with an arm that is affected by lymphedema.  · Remember that the affected area is more likely to become injured or infected.  · Take these steps to help prevent infection:    Keep the affected area clean and dry.    Protect your skin from cuts. For example, you should use gloves while cooking or gardening. Do not walk barefoot. If you shave the affected area, use an electric razor.  General Instructions  · Take medicines only as directed by your health care provider.  · Eat a healthy diet that includes a lot of fruits and vegetables.  · Do not wear tight clothes, shoes, or  jewelry.  · Do not use heating pads over the affected area.  · Avoid having blood pressure checked on the affected limb.  · Keep all follow-up visits as directed by your health care provider. This is important.  SEEK MEDICAL CARE IF:  · You continue to have swelling in your limb.  · You have a fever.  · You have a cut that does not heal.  · You have redness or pain in the affected area.  · You have new swelling in your limb that comes on suddenly.  · You develop purplish spots or sores (lesions) on your limb.  SEEK IMMEDIATE MEDICAL CARE IF:  · You have a skin rash.  · You have chills or sweats.  · You have shortness of breath.     This information is not intended to replace advice given to you by your health care provider. Make sure you discuss any questions you have with your health care provider.     Document Released: 04/05/2007 Document Revised: 10/23/2014 Document Reviewed: 05/16/2014  Elsevier Interactive Patient Education ©2016 [employer]

## 2016-02-10 ENCOUNTER — Encounter: Payer: Self-pay | Admitting: Family Medicine

## 2016-02-10 ENCOUNTER — Ambulatory Visit: Payer: Medicaid Other | Attending: Family Medicine | Admitting: Family Medicine

## 2016-02-10 VITALS — BP 124/77 | HR 74 | Temp 97.5°F | Ht 63.0 in | Wt 222.6 lb

## 2016-02-10 DIAGNOSIS — L309 Dermatitis, unspecified: Secondary | ICD-10-CM | POA: Diagnosis not present

## 2016-02-10 DIAGNOSIS — Z79899 Other long term (current) drug therapy: Secondary | ICD-10-CM | POA: Insufficient documentation

## 2016-02-10 DIAGNOSIS — J302 Other seasonal allergic rhinitis: Secondary | ICD-10-CM | POA: Diagnosis not present

## 2016-02-10 DIAGNOSIS — E669 Obesity, unspecified: Secondary | ICD-10-CM | POA: Diagnosis not present

## 2016-02-10 DIAGNOSIS — Z9889 Other specified postprocedural states: Secondary | ICD-10-CM | POA: Insufficient documentation

## 2016-02-10 MED ORDER — FLUTICASONE PROPIONATE 50 MCG/ACT NA SUSP: 2.0000 | Freq: Every day | NASAL | 3 refills | Status: DC

## 2016-02-10 MED ORDER — LORATADINE 10 MG PO TABS: 10.0000 mg | ORAL_TABLET | Freq: Every day | ORAL | 3 refills | Status: DC

## 2016-02-10 NOTE — Progress Notes (Signed)
Subjective:  Patient ID: Amber Arroyo, female    DOB: [birth year]  Age: 20 y.o. MRN: [id number]  CC: Eczema (f/u)   HPI Amber Arroyo is a [age] year old female with a history of eczema, seasonal allergies who comes in today for follow-up of right leg cellulitis and edema has completed a course of Keflex and Lasix resulting improvement in symptoms.  Continues to use triamcinolone cream for eczema. Request refill of Claritin and Flonase which she uses for seasonal allergies. No other complaints today  Past Medical History:  Diagnosis Date  . Allergy    eczema, seasonal allergies  . Eczema   . Obesity     Past Surgical History:  Procedure Laterality Date  . INCISION AND DRAINAGE ABSCESS Left 02/04/2013   Procedure: INCISION AND DRAINAGE POSTERIOR NECK ABSCESS;  Surgeon: Flo ShanksKarol Wolicki, MD;  [facility];  Service: ENT;  Laterality: Left;  . MOUTH SURGERY     Wisdom Teeth Extraction      Outpatient Medications Prior to Visit  Medication Sig Dispense Refill  . triamcinolone cream (KENALOG) 0.1 % Apply 1 application topically 2 (two) times daily. 453.6 g 1  . cephALEXin (KEFLEX) 500 MG capsule Take 1 capsule (500 mg total) by mouth 4 (four) times daily. (Patient not taking: Reported on 02/10/2016) 40 capsule 0  . fluticasone (FLONASE) 50 MCG/ACT nasal spray Place 2 sprays into the nose daily. (Patient not taking: Reported on 01/07/2016) 16 g 2  . furosemide (LASIX) 20 MG tablet Take 1 tablet (20 mg total) by mouth daily. (Patient not taking: Reported on 02/10/2016) 5 tablet 0  . loratadine (CLARITIN) 10 MG tablet Take 10 mg by mouth daily. Reported on 01/07/2016    . naproxen (NAPROSYN) 500 MG tablet Take 1 tablet (500 mg total) by mouth 2 (two) times daily. (Patient not taking: Reported on 02/10/2016) 30 tablet 0   No facility-administered medications prior to visit.     ROS Review of Systems  Constitutional: Negative for activity change and appetite change.  HENT: Negative for sinus  pressure and sore throat.   Respiratory: Negative for chest tightness, shortness of breath and wheezing.   Cardiovascular: Negative for chest pain and palpitations.  Gastrointestinal: Negative for abdominal distention, abdominal pain and constipation.  Genitourinary: Negative.   Musculoskeletal: Negative.   Skin: Positive for rash (see hpi).  Psychiatric/Behavioral: Negative for behavioral problems and dysphoric mood.    Objective:  BP 124/77 (BP Location: Right Arm, Patient Position: Sitting, Cuff Size: Large)   Pulse 74   Temp 97.5 F (36.4 C) (Oral)   Ht 5\' 3"  (1.6 m)   Wt 222 lb 9.6 oz (101 kg)   SpO2 100%   BMI 39.43 kg/m   BP/Weight 02/10/2016 01/07/2016 01/01/2016  Systolic BP 124 121 115  Diastolic BP 77 78 72  Wt. (Lbs) 222.6 232.8 238.6  BMI 39.43 41.25 42.28      Physical Exam  Constitutional: She is oriented to person, place, and time. She appears well-developed and well-nourished.  Cardiovascular: Normal rate, normal heart sounds and intact distal pulses.   No murmur heard. Pulmonary/Chest: Effort normal and breath sounds normal. She has no wheezes. She has no rales. She exhibits no tenderness.  Abdominal: Soft. Bowel sounds are normal. She exhibits no distension and no mass. There is no tenderness.  Musculoskeletal: Normal range of motion.  Neurological: She is alert and oriented to person, place, and time.  Skin:  Eczematous lesions on hands     Assessment & Plan:  1. Eczema Controlled on triamcinolone cream  2. Obesity Advised to increase physical activity and reduce portion sizes  3. Seasonal allergies Refilled Flonase and Claritin   Meds ordered this encounter  Medications  . fluticasone (FLONASE) 50 MCG/ACT nasal spray    Sig: Place 2 sprays into both nostrils daily.    Dispense:  16 g    Refill:  3  . loratadine (CLARITIN) 10 MG tablet    Sig: Take 1 tablet (10 mg total) by mouth daily. Reported on 01/07/2016    Dispense:  30 tablet     Refill:  3    Follow-up: Return in about 3 months (around 05/12/2016) for Follow-up on eczema.   This note has been created with Education officer, environmentalDragon speech recognition software and smart phrase technology. Any transcriptional errors are unintentional.    Jaclyn ShaggyEnobong Amao MD

## 2016-02-10 NOTE — Progress Notes (Signed)
Right leg infection "cleared up" Needs flonase and claritin

## 2016-06-08 ENCOUNTER — Other Ambulatory Visit: Payer: Self-pay | Admitting: Family Medicine

## 2016-06-08 DIAGNOSIS — L309 Dermatitis, unspecified: Secondary | ICD-10-CM

## 2016-08-03 ENCOUNTER — Ambulatory Visit: Payer: Medicaid Other

## 2016-08-14 ENCOUNTER — Encounter: Payer: Self-pay | Admitting: Family Medicine

## 2016-08-14 ENCOUNTER — Ambulatory Visit: Payer: Medicaid Other | Attending: Family Medicine | Admitting: Family Medicine

## 2016-08-14 VITALS — BP 117/76 | HR 68 | Temp 97.9°F | Ht 63.0 in | Wt 244.2 lb

## 2016-08-14 DIAGNOSIS — J45909 Unspecified asthma, uncomplicated: Secondary | ICD-10-CM | POA: Diagnosis not present

## 2016-08-14 DIAGNOSIS — L308 Other specified dermatitis: Secondary | ICD-10-CM

## 2016-08-14 DIAGNOSIS — K529 Noninfective gastroenteritis and colitis, unspecified: Secondary | ICD-10-CM | POA: Insufficient documentation

## 2016-08-14 DIAGNOSIS — L03115 Cellulitis of right lower limb: Secondary | ICD-10-CM

## 2016-08-14 DIAGNOSIS — L309 Dermatitis, unspecified: Secondary | ICD-10-CM | POA: Insufficient documentation

## 2016-08-14 DIAGNOSIS — R109 Unspecified abdominal pain: Secondary | ICD-10-CM | POA: Diagnosis present

## 2016-08-14 DIAGNOSIS — Z79899 Other long term (current) drug therapy: Secondary | ICD-10-CM | POA: Diagnosis not present

## 2016-08-14 MED ORDER — CEPHALEXIN 500 MG PO CAPS: 500.0000 mg | ORAL_CAPSULE | Freq: Two times a day (BID) | ORAL | 0 refills | Status: DC

## 2016-08-14 MED ORDER — PREDNISONE 20 MG PO TABS: 20.0000 mg | ORAL_TABLET | Freq: Two times a day (BID) | ORAL | 0 refills | Status: DC

## 2016-08-14 MED ORDER — PROMETHAZINE HCL 25 MG PO TABS: 25.0000 mg | ORAL_TABLET | Freq: Three times a day (TID) | ORAL | 0 refills | Status: DC | PRN

## 2016-08-14 NOTE — Progress Notes (Signed)
Subjective:  Patient ID: Amber Arroyo, female    DOB: [birth year]  Age: 21 y.o. MRN: [id number]  CC: Abdominal Pain; Emesis (not since yesterday); Leg Pain (right leg); and Asthma   HPI Amber Arroyo is a 21 year old female with a history of eczema, asthma (not currently on any bronchodilators assessment is controlled) who presents today complaining of right leg pain for the last 3 days with associated redness; she notes increased itching in the right leg and subsequently developed redness but has not had any fever.  She has also had abdominal pain and nausea over the last 2 weeks intermittently; she works at a daycare. Denies diarrhea or fever.   Past Medical History:  Diagnosis Date  . Allergy    eczema, seasonal allergies  . Eczema   . Obesity     Past Surgical History:  Procedure Laterality Date  . INCISION AND DRAINAGE ABSCESS Left 02/04/2013   Procedure: INCISION AND DRAINAGE POSTERIOR NECK ABSCESS;  Surgeon: Flo Shanks, MD;  Location: [facility] OR;  Service: ENT;  Laterality: Left;  . MOUTH SURGERY     Wisdom Teeth Extraction    No Known Allergies   Outpatient Medications Prior to Visit  Medication Sig Dispense Refill  . fluticasone (FLONASE) 50 MCG/ACT nasal spray Place 2 sprays into both nostrils daily. 16 g 3  . loratadine (CLARITIN) 10 MG tablet Take 1 tablet (10 mg total) by mouth daily. Reported on 01/07/2016 30 tablet 3  . triamcinolone cream (KENALOG) 0.1 % apply to affected area twice a day 454 g 0   No facility-administered medications prior to visit.     ROS Review of Systems  Constitutional: Negative for activity change, appetite change and fatigue.  HENT: Negative for congestion, sinus pressure and sore throat.   Eyes: Negative for visual disturbance.  Respiratory: Negative for cough, chest tightness, shortness of breath and wheezing.   Cardiovascular: Negative for chest pain and palpitations.  Gastrointestinal: Positive for abdominal pain and nausea. Negative  for abdominal distention and constipation.  Endocrine: Negative for polydipsia.  Genitourinary: Negative for dysuria and frequency.  Musculoskeletal:       See history of present illness  Skin: Positive for rash.  Neurological: Negative for tremors, light-headedness and numbness.  Hematological: Does not bruise/bleed easily.  Psychiatric/Behavioral: Negative for agitation and behavioral problems.    Objective:  BP 117/76 (BP Location: Right Arm, Patient Position: Sitting, Cuff Size: Large)   Pulse 68   Temp 97.9 F (36.6 C) (Oral)   Ht 5\' 3"  (1.6 m)   Wt 244 lb 3.2 oz (110.8 kg)   SpO2 98%   BMI 43.26 kg/m   BP/Weight 08/14/2016 02/10/2016 01/07/2016  Systolic BP 117 124 121  Diastolic BP 76 77 78  Wt. (Lbs) 244.2 222.6 232.8  BMI 43.26 39.43 41.25      Physical Exam  Constitutional: She is oriented to person, place, and time. She appears well-developed and well-nourished.  Cardiovascular: Normal rate, normal heart sounds and intact distal pulses.   No murmur heard. Pulmonary/Chest: Effort normal and breath sounds normal. She has no wheezes. She has no rales. She exhibits no tenderness.  Abdominal: Soft. Bowel sounds are normal. She exhibits no distension and no mass. There is no tenderness.  Musculoskeletal: Normal range of motion. She exhibits edema (1+ bilateral pedal edema; erythematous blotches and lower half of right leg). She exhibits no tenderness.  Neurological: She is alert and oriented to person, place, and time.  Skin: Rash (lichenified hyperpigmented rash on  bilateral ankles and lateral malleolus) noted.     Assessment & Plan:   1. Other eczema Uncontrolled Discussed skin care, avoiding scented products and avoiding hot baths Use moisturizers Continue using triamcinolone cream Will use short burst of oral steroid - predniSONE (DELTASONE) 20 MG tablet; Take 1 tablet (20 mg total) by mouth 2 (two) times daily with a meal.  Dispense: 10 tablet; Refill:  0  2. Cellulitis of right lower extremity - cephALEXin (KEFLEX) 500 MG capsule; Take 1 capsule (500 mg total) by mouth 2 (two) times daily.  Dispense: 20 capsule; Refill: 0  3. Gastroenteritis Resolving - promethazine (PHENERGAN) 25 MG tablet; Take 1 tablet (25 mg total) by mouth every 8 (eight) hours as needed for nausea or vomiting.  Dispense: 20 tablet; Refill: 0   Meds ordered this encounter  Medications  . promethazine (PHENERGAN) 25 MG tablet    Sig: Take 1 tablet (25 mg total) by mouth every 8 (eight) hours as needed for nausea or vomiting.    Dispense:  20 tablet    Refill:  0  . cephALEXin (KEFLEX) 500 MG capsule    Sig: Take 1 capsule (500 mg total) by mouth 2 (two) times daily.    Dispense:  20 capsule    Refill:  0  . predniSONE (DELTASONE) 20 MG tablet    Sig: Take 1 tablet (20 mg total) by mouth 2 (two) times daily with a meal.    Dispense:  10 tablet    Refill:  0    Follow-up: Return in about 2 weeks (around 08/28/2016) for Follow-up of right lower extremity cellulitis.   Jaclyn ShaggyEnobong Amao MD

## 2016-08-28 ENCOUNTER — Ambulatory Visit: Payer: Medicaid Other | Admitting: Family Medicine

## 2016-09-03 ENCOUNTER — Other Ambulatory Visit: Payer: Self-pay | Admitting: Family Medicine

## 2016-09-03 DIAGNOSIS — L309 Dermatitis, unspecified: Secondary | ICD-10-CM

## 2016-11-03 ENCOUNTER — Encounter: Payer: Self-pay | Admitting: Family Medicine

## 2016-11-03 ENCOUNTER — Ambulatory Visit: Payer: Self-pay | Attending: Family Medicine | Admitting: Family Medicine

## 2016-11-03 VITALS — BP 116/62 | HR 78 | Temp 98.3°F | Resp 18 | Ht 63.0 in | Wt 255.0 lb

## 2016-11-03 DIAGNOSIS — Z6841 Body Mass Index (BMI) 40.0 and over, adult: Secondary | ICD-10-CM | POA: Insufficient documentation

## 2016-11-03 DIAGNOSIS — J45909 Unspecified asthma, uncomplicated: Secondary | ICD-10-CM | POA: Insufficient documentation

## 2016-11-03 DIAGNOSIS — R21 Rash and other nonspecific skin eruption: Secondary | ICD-10-CM | POA: Insufficient documentation

## 2016-11-03 DIAGNOSIS — L309 Dermatitis, unspecified: Secondary | ICD-10-CM | POA: Insufficient documentation

## 2016-11-03 DIAGNOSIS — L308 Other specified dermatitis: Secondary | ICD-10-CM

## 2016-11-03 DIAGNOSIS — E669 Obesity, unspecified: Secondary | ICD-10-CM | POA: Insufficient documentation

## 2016-11-03 DIAGNOSIS — Z79899 Other long term (current) drug therapy: Secondary | ICD-10-CM | POA: Insufficient documentation

## 2016-11-03 MED ORDER — TRIAMCINOLONE ACETONIDE 0.1 % EX CREA: TOPICAL_CREAM | CUTANEOUS | 1 refills | Status: DC

## 2016-11-03 MED ORDER — CEPHALEXIN 500 MG PO CAPS: 500.0000 mg | ORAL_CAPSULE | Freq: Two times a day (BID) | ORAL | 0 refills | Status: DC

## 2016-11-03 NOTE — Progress Notes (Signed)
Patient is here for FU cellulitis.  Patient denies pain at this time.  Patient has not taken medication today. Patient has eaten today.  Patient request a refill on Kenalog cream.  Patient complains of red bumps presenting on the right arm. Patient states sights began to itch last night.  Patient has used Kenalog cream on the area with no relief.

## 2016-11-03 NOTE — Progress Notes (Signed)
Subjective:  Patient ID: Amber Arroyo, female    DOB: 1995/12/04  Age: 21 y.o. MRN: [id number]  CC: Cellulitis   HPI Amber Arroyo is a 21 year old female with a history of eczema, asthma (not currently on any bronchodilators as asthma is controlled) who presents today with a right forearm rash which has been present for the last 2-3 weeks. Rash is erythematous and only became pruritic last night. She denies worsening of rash or presence of rash in other body parts. Her eczema is worse on her hands and her not being controlled with triamcinolone cream however eczematous lesions on the lower extremities have improved. She denies allergies to foods, soaps or creams.  Past Medical History:  Diagnosis Date  . Allergy    eczema, seasonal allergies  . Eczema   . Obesity     Past Surgical History:  Procedure Laterality Date  . INCISION AND DRAINAGE ABSCESS Left 02/04/2013   Procedure: INCISION AND DRAINAGE POSTERIOR NECK ABSCESS;  Surgeon: Jodi Marble, MD;  Location: [place];  Service: ENT;  Laterality: Left;  . MOUTH SURGERY     Wisdom Teeth Extraction    No Known Allergies   Outpatient Medications Prior to Visit  Medication Sig Dispense Refill  . fluticasone (FLONASE) 50 MCG/ACT nasal spray Place 2 sprays into both nostrils daily. 16 g 3  . loratadine (CLARITIN) 10 MG tablet Take 1 tablet (10 mg total) by mouth daily. Reported on 01/07/2016 30 tablet 3  . promethazine (PHENERGAN) 25 MG tablet Take 1 tablet (25 mg total) by mouth every 8 (eight) hours as needed for nausea or vomiting. 20 tablet 0  . triamcinolone cream (KENALOG) 0.1 % apply to affected area twice a day 454 g 0  . cephALEXin (KEFLEX) 500 MG capsule Take 1 capsule (500 mg total) by mouth 2 (two) times daily. 20 capsule 0  . predniSONE (DELTASONE) 20 MG tablet Take 1 tablet (20 mg total) by mouth 2 (two) times daily with a meal. 10 tablet 0   No facility-administered medications prior to visit.     ROS Review of Systems    Constitutional: Negative for activity change, appetite change and fatigue.  HENT: Negative for congestion, sinus pressure and sore throat.   Eyes: Negative for visual disturbance.  Respiratory: Negative for cough, chest tightness, shortness of breath and wheezing.   Cardiovascular: Negative for chest pain and palpitations.  Gastrointestinal: Negative for abdominal distention, abdominal pain and constipation.  Endocrine: Negative for polydipsia.  Genitourinary: Negative for dysuria and frequency.  Musculoskeletal: Negative for arthralgias and back pain.  Skin: Positive for rash.  Neurological: Negative for tremors, light-headedness and numbness.  Hematological: Does not bruise/bleed easily.  Psychiatric/Behavioral: Negative for agitation and behavioral problems.    Objective:  BP 116/62 (BP Location: Left Arm, Patient Position: Sitting, Cuff Size: Large)   Pulse 78   Temp 98.3 F (36.8 C) (Oral)   Resp 18   Ht _0  (1.6 m)   Wt 255 lb (115.7 kg)   LMP 10/09/2016   SpO2 99%   BMI 45.17 kg/m   BP/Weight 11/03/2016 08/14/2016 3/53/6144  Systolic BP 315 400 867  Diastolic BP 62 76 77  Wt. (Lbs) 255 244.2 222.6  BMI 45.17 43.26 39.43      Physical Exam Constitutional: She is oriented to person, place, and time. She appears well-developed and well-nourished.  Cardiovascular: Normal rate, normal heart sounds and intact distal pulses.   No murmur heard. Pulmonary/Chest: Effort normal and breath sounds normal.  She has no wheezes. She has no rales. She exhibits no tenderness.  Abdominal: Soft. Bowel sounds are normal. She exhibits no distension and no mass. There is no tenderness.  Musculoskeletal: Normal range of motion.  Neurological: She is alert and oriented to person, place, and time.  Skin: Rash (lichenified hyperpigmented rash on dorsum of both hands) noted.  flexor aspect of right forearm with erythematous patches and single area of arthropod bite  Assessment & Plan:    1. Other eczema Not improving Discussed skin care and the need to avoid scented products - triamcinolone cream (KENALOG) 0.1 %; apply to affected area twice a day  Dispense: 454 g; Refill: 1  2. Rash Unknown etiology Will treat presumptively for cellulitis We'll send off labs Patient to notify the clinic or present to the ED if symptoms persist. - cephALEXin (KEFLEX) 500 MG capsule; Take 1 capsule (500 mg total) by mouth 2 (two) times daily.  Dispense: 20 capsule; Refill: 0 - CMP14+EGFR - CBC with Differential/Platelet   Meds ordered this encounter  Medications  . triamcinolone cream (KENALOG) 0.1 %    Sig: apply to affected area twice a day    Dispense:  454 g    Refill:  1  . cephALEXin (KEFLEX) 500 MG capsule    Sig: Take 1 capsule (500 mg total) by mouth 2 (two) times daily.    Dispense:  20 capsule    Refill:  0    Follow-up: Return in about 2 weeks (around 11/17/2016) for Follow-up on rash.   Arnoldo Morale MD

## 2016-11-03 NOTE — Patient Instructions (Signed)

## 2016-11-04 LAB — CMP14+EGFR
A/G RATIO: 1.4 (ref 1.2–2.2)
ALT: 16 IU/L (ref 0–32)
AST: 15 IU/L (ref 0–40)
Albumin: 4.1 g/dL (ref 3.5–5.5)
Alkaline Phosphatase: 40 IU/L (ref 39–117)
BUN/Creatinine Ratio: 24 — ABNORMAL HIGH (ref 9–23)
BUN: 14 mg/dL (ref 6–20)
Bilirubin Total: <0.2 mg/dL (ref 0.0–1.2)
CALCIUM: 9.4 mg/dL (ref 8.7–10.2)
CHLORIDE: 103 mmol/L (ref 96–106)
CO2: 25 mmol/L (ref 18–29)
Creatinine, Ser: 0.58 mg/dL (ref 0.57–1.00)
GFR calc Af Amer: 152 mL/min/{1.73_m2} (ref >59)
GFR, EST NON AFRICAN AMERICAN: 132 mL/min/{1.73_m2} (ref >59)
Globulin, Total: 2.9 g/dL (ref 1.5–4.5)
Glucose: 80 mg/dL (ref 65–99)
Potassium: 4.1 mmol/L (ref 3.5–5.2)
Sodium: 142 mmol/L (ref 134–144)
Total Protein: 7 g/dL (ref 6.0–8.5)

## 2016-11-04 LAB — CBC WITH DIFFERENTIAL/PLATELET
BASOS ABS: 0 10*3/uL (ref 0.0–0.2)
Basos: 1 %
EOS (ABSOLUTE): 0.4 10*3/uL (ref 0.0–0.4)
Eos: 8 %
Hematocrit: 39.7 % (ref 34.0–46.6)
Hemoglobin: 12.7 g/dL (ref 11.1–15.9)
IMMATURE GRANULOCYTES: 0 %
Immature Grans (Abs): 0 10*3/uL (ref 0.0–0.1)
Lymphocytes Absolute: 1.6 10*3/uL (ref 0.7–3.1)
Lymphs: 27 %
MCH: 26.3 pg — ABNORMAL LOW (ref 26.6–33.0)
MCHC: 32 g/dL (ref 31.5–35.7)
MCV: 82 fL (ref 79–97)
MONOCYTES: 9 %
MONOS ABS: 0.5 10*3/uL (ref 0.1–0.9)
NEUTROS PCT: 55 %
Neutrophils Absolute: 3.2 10*3/uL (ref 1.4–7.0)
Platelets: 264 10*3/uL (ref 150–379)
RBC: 4.83 x10E6/uL (ref 3.77–5.28)
RDW: 15.6 % — AB (ref 12.3–15.4)
WBC: 5.8 10*3/uL (ref 3.4–10.8)

## 2016-11-09 ENCOUNTER — Telehealth: Payer: Self-pay | Admitting: *Deleted

## 2016-11-09 NOTE — Telephone Encounter (Signed)
-----   Message from Jaclyn ShaggyEnobong Amao, MD sent at 11/04/2016 11:54 AM EDT ----- Blood work does not show evidence of infection.

## 2016-11-09 NOTE — Telephone Encounter (Signed)
MA informed patient of no evidence of an infection being noted in the blood work. Medical Assistant left message on patient's home and cell voicemail. Voicemail states to give a call back to [place] [facility] at [phone].

## 2016-11-20 ENCOUNTER — Ambulatory Visit: Payer: Self-pay | Admitting: Family Medicine

## 2016-12-15 ENCOUNTER — Encounter: Payer: Self-pay | Admitting: Family Medicine

## 2016-12-15 ENCOUNTER — Ambulatory Visit: Payer: Self-pay | Attending: Family Medicine | Admitting: Family Medicine

## 2016-12-15 DIAGNOSIS — Z6841 Body Mass Index (BMI) 40.0 and over, adult: Secondary | ICD-10-CM | POA: Insufficient documentation

## 2016-12-15 DIAGNOSIS — E669 Obesity, unspecified: Secondary | ICD-10-CM | POA: Insufficient documentation

## 2016-12-15 DIAGNOSIS — Z79899 Other long term (current) drug therapy: Secondary | ICD-10-CM | POA: Insufficient documentation

## 2016-12-15 DIAGNOSIS — J45909 Unspecified asthma, uncomplicated: Secondary | ICD-10-CM | POA: Insufficient documentation

## 2016-12-15 DIAGNOSIS — K529 Noninfective gastroenteritis and colitis, unspecified: Secondary | ICD-10-CM | POA: Insufficient documentation

## 2016-12-15 MED ORDER — DIPHENOXYLATE-ATROPINE 2.5-0.025 MG PO TABS: 1.0000 | ORAL_TABLET | Freq: Four times a day (QID) | ORAL | 0 refills | Status: DC | PRN

## 2016-12-15 MED ORDER — PROMETHAZINE HCL 25 MG PO TABS: 25.0000 mg | ORAL_TABLET | Freq: Three times a day (TID) | ORAL | 0 refills | Status: DC | PRN

## 2016-12-15 NOTE — Progress Notes (Signed)
Subjective:  Patient ID: Amber Arroyo, female    DOB: Sep 10, 1995  Age: 21 y.o. MRN: [id number]  CC: Diarrhea   HPI Amber Arroyo is a 21 year old female with a history of eczema, asthma (not currently on any bronchodilators as asthma is controlled) who presents today complaining of a 2 day history of diarrhea after she had a hamburger from [facility]. She does endorse associated nausea but no vomiting and has had no fever. Diarrhea has been watery and occurs several times a day necessitating her calling out of work. She also has abdominal cramping whenever she eats.  Past Medical History:  Diagnosis Date  . Allergy    eczema, seasonal allergies  . Eczema   . Obesity     Past Surgical History:  Procedure Laterality Date  . INCISION AND DRAINAGE ABSCESS Left 02/04/2013   Procedure: INCISION AND DRAINAGE POSTERIOR NECK ABSCESS;  Surgeon: Flo Shanks, MD;  Location: [facility] OR;  Service: ENT;  Laterality: Left;  . MOUTH SURGERY     Wisdom Teeth Extraction    No Known Allergies    Outpatient Medications Prior to Visit  Medication Sig Dispense Refill  . fluticasone (FLONASE) 50 MCG/ACT nasal spray Place 2 sprays into both nostrils daily. 16 g 3  . loratadine (CLARITIN) 10 MG tablet Take 1 tablet (10 mg total) by mouth daily. Reported on 01/07/2016 30 tablet 3  . triamcinolone cream (KENALOG) 0.1 % apply to affected area twice a day 454 g 1  . promethazine (PHENERGAN) 25 MG tablet Take 1 tablet (25 mg total) by mouth every 8 (eight) hours as needed for nausea or vomiting. 20 tablet 0  . cephALEXin (KEFLEX) 500 MG capsule Take 1 capsule (500 mg total) by mouth 2 (two) times daily. (Patient not taking: Reported on 12/15/2016) 20 capsule 0   No facility-administered medications prior to visit.     ROS Review of Systems  Constitutional: Negative for activity change, appetite change and fatigue.  HENT: Negative for congestion, sinus pressure and sore throat.   Eyes: Negative for visual  disturbance.  Respiratory: Negative for cough, chest tightness, shortness of breath and wheezing.   Cardiovascular: Negative for chest pain and palpitations.  Gastrointestinal:       See hpi  Endocrine: Negative for polydipsia.  Genitourinary: Negative for dysuria and frequency.  Musculoskeletal: Negative for arthralgias and back pain.  Skin: Negative for rash.  Neurological: Negative for tremors, light-headedness and numbness.  Hematological: Does not bruise/bleed easily.  Psychiatric/Behavioral: Negative for agitation and behavioral problems.    Objective:  BP 108/74   Pulse 74   Temp 97.5 F (36.4 C) (Oral)   Wt 245 lb 3.2 oz (111.2 kg)   SpO2 98%   BMI 43.44 kg/m   BP/Weight 12/15/2016 11/03/2016 08/14/2016  Systolic BP 108 116 117  Diastolic BP 74 62 76  Wt. (Lbs) 245.2 255 244.2  BMI 43.44 45.17 43.26      Physical Exam  Constitutional: She is oriented to person, place, and time. She appears well-developed and well-nourished.  Cardiovascular: Normal rate, normal heart sounds and intact distal pulses.   No murmur heard. Pulmonary/Chest: Effort normal and breath sounds normal. She has no wheezes. She has no rales. She exhibits no tenderness.  Abdominal: Soft. Bowel sounds are normal. She exhibits no distension and no mass. There is no tenderness.  Musculoskeletal: Normal range of motion.  Neurological: She is alert and oriented to person, place, and time.     Assessment & Plan:  1. Gastroenteritis BRAT diet Increase hydration - promethazine (PHENERGAN) 25 MG tablet; Take 1 tablet (25 mg total) by mouth every 8 (eight) hours as needed for nausea or vomiting.  Dispense: 20 tablet; Refill: 0 - diphenoxylate-atropine (LOMOTIL) 2.5-0.025 MG tablet; Take 1 tablet by mouth 4 (four) times daily as needed for diarrhea or loose stools.  Dispense: 30 tablet; Refill: 0   Meds ordered this encounter  Medications  . promethazine (PHENERGAN) 25 MG tablet    Sig: Take 1  tablet (25 mg total) by mouth every 8 (eight) hours as needed for nausea or vomiting.    Dispense:  20 tablet    Refill:  0  . diphenoxylate-atropine (LOMOTIL) 2.5-0.025 MG tablet    Sig: Take 1 tablet by mouth 4 (four) times daily as needed for diarrhea or loose stools.    Dispense:  30 tablet    Refill:  0    Follow-up: Return if symptoms worsen or fail to improve.   Jaclyn ShaggyEnobong Amao MD

## 2017-02-24 ENCOUNTER — Encounter: Payer: Self-pay | Admitting: Family Medicine

## 2017-02-24 ENCOUNTER — Other Ambulatory Visit (HOSPITAL_COMMUNITY)
Admission: RE | Admit: 2017-02-24 | Discharge: 2017-02-24 | Disposition: A | Discharge status: Home / Self Care | Payer: Medicaid Other | Source: Ambulatory Visit | Attending: Family Medicine | Admitting: Family Medicine

## 2017-02-24 ENCOUNTER — Ambulatory Visit: Payer: Self-pay | Attending: Family Medicine | Admitting: Family Medicine

## 2017-02-24 VITALS — BP 118/78 | HR 102 | Temp 97.8°F | Ht 63.0 in | Wt 255.6 lb

## 2017-02-24 DIAGNOSIS — Z0000 Encounter for general adult medical examination without abnormal findings: Secondary | ICD-10-CM | POA: Insufficient documentation

## 2017-02-24 DIAGNOSIS — E669 Obesity, unspecified: Secondary | ICD-10-CM | POA: Insufficient documentation

## 2017-02-24 DIAGNOSIS — Z124 Encounter for screening for malignant neoplasm of cervix: Secondary | ICD-10-CM | POA: Insufficient documentation

## 2017-02-24 DIAGNOSIS — Z79899 Other long term (current) drug therapy: Secondary | ICD-10-CM | POA: Insufficient documentation

## 2017-02-24 DIAGNOSIS — Z113 Encounter for screening for infections with a predominantly sexual mode of transmission: Secondary | ICD-10-CM | POA: Insufficient documentation

## 2017-02-24 DIAGNOSIS — N760 Acute vaginitis: Secondary | ICD-10-CM | POA: Insufficient documentation

## 2017-02-24 DIAGNOSIS — Z6841 Body Mass Index (BMI) 40.0 and over, adult: Secondary | ICD-10-CM | POA: Insufficient documentation

## 2017-02-24 DIAGNOSIS — B9689 Other specified bacterial agents as the cause of diseases classified elsewhere: Secondary | ICD-10-CM | POA: Insufficient documentation

## 2017-02-24 MED ORDER — METRONIDAZOLE 0.75 % VA GEL: 1.0000 | Freq: Every day | VAGINAL | 0 refills | Status: DC

## 2017-02-24 NOTE — Patient Instructions (Signed)

## 2017-02-25 LAB — HIV ANTIBODY (ROUTINE TESTING W REFLEX): HIV Screen 4th Generation wRfx: NONREACTIVE

## 2017-02-25 NOTE — Progress Notes (Signed)
Subjective:  Patient ID: Amber Arroyo, female    DOB: 26-Dec-1995  Age: 21 y.o. MRN: [id number]  CC: Annual Exam and Gynecologic Exam   HPI Amber Lewandowskyyra Whinery presents for A complete physical exam.  Past Medical History:  Diagnosis Date  . Allergy    eczema, seasonal allergies  . Eczema   . Obesity     Past Surgical History:  Procedure Laterality Date  . INCISION AND DRAINAGE ABSCESS Left 02/04/2013   Procedure: INCISION AND DRAINAGE POSTERIOR NECK ABSCESS;  Surgeon: Flo ShanksKarol Wolicki, MD;  [facility];  Service: ENT;  Laterality: Left;  . MOUTH SURGERY     Wisdom Teeth Extraction    No Known Allergies    Outpatient Medications Prior to Visit  Medication Sig Dispense Refill  . fluticasone (FLONASE) 50 MCG/ACT nasal spray Place 2 sprays into both nostrils daily. 16 g 3  . loratadine (CLARITIN) 10 MG tablet Take 1 tablet (10 mg total) by mouth daily. Reported on 01/07/2016 30 tablet 3  . triamcinolone cream (KENALOG) 0.1 % apply to affected area twice a day 454 g 1  . promethazine (PHENERGAN) 25 MG tablet Take 1 tablet (25 mg total) by mouth every 8 (eight) hours as needed for nausea or vomiting. (Patient not taking: Reported on 02/24/2017) 20 tablet 0  . cephALEXin (KEFLEX) 500 MG capsule Take 1 capsule (500 mg total) by mouth 2 (two) times daily. (Patient not taking: Reported on 12/15/2016) 20 capsule 0  . diphenoxylate-atropine (LOMOTIL) 2.5-0.025 MG tablet Take 1 tablet by mouth 4 (four) times daily as needed for diarrhea or loose stools. (Patient not taking: Reported on 02/24/2017) 30 tablet 0   No facility-administered medications prior to visit.     ROS Review of Systems  Constitutional: Negative for activity change, appetite change and fatigue.  HENT: Negative for congestion, sinus pressure and sore throat.   Eyes: Negative for visual disturbance.  Respiratory: Negative for cough, chest tightness, shortness of breath and wheezing.   Cardiovascular: Negative for chest pain and  palpitations.  Gastrointestinal: Negative for abdominal distention, abdominal pain and constipation.  Endocrine: Negative for polydipsia.  Genitourinary: Negative for dysuria and frequency.  Musculoskeletal: Negative for arthralgias and back pain.  Skin: Negative for rash.  Neurological: Negative for tremors, light-headedness and numbness.  Hematological: Does not bruise/bleed easily.  Psychiatric/Behavioral: Negative for agitation and behavioral problems.    Objective:  BP 118/78   Pulse (!) 102   Temp 97.8 F (36.6 C) (Oral)   Ht 5\' 3"  (1.6 m)   Wt 255 lb 9.6 oz (115.9 kg)   LMP 01/27/2017   SpO2 97%   BMI 45.28 kg/m   BP/Weight 02/24/2017 12/15/2016 11/03/2016  Systolic BP 118 108 116  Diastolic BP 78 74 62  Wt. (Lbs) 255.6 245.2 255  BMI 45.28 43.44 45.17      Physical Exam  Constitutional: She is oriented to person, place, and time. She appears well-developed and well-nourished. No distress.  HENT:  Head: Normocephalic.  Right Ear: External ear normal.  Left Ear: External ear normal.  Nose: Nose normal.  Mouth/Throat: Oropharynx is clear and moist.  Eyes: Pupils are equal, round, and reactive to light. Conjunctivae and EOM are normal.  Neck: Normal range of motion. No JVD present.  Cardiovascular: Normal rate, regular rhythm, normal heart sounds and intact distal pulses.  Exam reveals no gallop.   No murmur heard. Pulmonary/Chest: Effort normal and breath sounds normal. No respiratory distress. She has no wheezes. She has no  rales. She exhibits no tenderness. Right breast exhibits no mass and no tenderness. Left breast exhibits no mass and no tenderness.  Abdominal: Soft. Bowel sounds are normal. She exhibits no distension and no mass. There is no tenderness.  Genitourinary:  Genitourinary Comments: Normal external genitalia. Vaginal discharge with fishy odor Normal cervix, normal adnexa  Musculoskeletal: Normal range of motion. She exhibits no edema or tenderness.    Neurological: She is alert and oriented to person, place, and time. She has normal reflexes.  Skin: Skin is warm and dry. She is not diaphoretic.  Psychiatric: She has a normal mood and affect.     Assessment & Plan:   1. Annual physical exam   2. Screening for cervical cancer - Cytology - PAP [place]  3. Screening for STD (sexually transmitted disease) - HIV antibody (with reflex)  4. Bacterial vaginosis - metroNIDAZOLE (METROGEL VAGINAL) 0.75 % vaginal gel; Place 1 Applicatorful vaginally at bedtime.  Dispense: 70 g; Refill: 0   Meds ordered this encounter  Medications  . metroNIDAZOLE (METROGEL VAGINAL) 0.75 % vaginal gel    Sig: Place 1 Applicatorful vaginally at bedtime.    Dispense:  70 g    Refill:  0    Follow-up: Return in about 3 months (around 05/26/2017) for Follow-up of asthma and eczema.   Jaclyn Shaggy MD

## 2017-02-26 LAB — CYTOLOGY - PAP
BACTERIAL VAGINITIS: POSITIVE — AB
CHLAMYDIA, DNA PROBE: NEGATIVE
Candida vaginitis: NEGATIVE
DIAGNOSIS: NEGATIVE
Neisseria Gonorrhea: NEGATIVE
Trichomonas: NEGATIVE

## 2017-03-01 ENCOUNTER — Telehealth: Payer: Self-pay

## 2017-03-01 NOTE — Telephone Encounter (Signed)
Pt was called and informed of lab results. 

## 2017-03-02 ENCOUNTER — Telehealth: Payer: Self-pay

## 2017-03-02 LAB — CERVICOVAGINAL ANCILLARY ONLY: Herpes: NEGATIVE

## 2017-03-02 NOTE — Telephone Encounter (Signed)
Pt was called and informed of lab results. 

## 2017-03-05 ENCOUNTER — Other Ambulatory Visit: Payer: Self-pay | Admitting: Family Medicine

## 2017-03-05 DIAGNOSIS — L308 Other specified dermatitis: Secondary | ICD-10-CM

## 2017-03-23 ENCOUNTER — Other Ambulatory Visit: Payer: Self-pay | Admitting: Family Medicine

## 2017-05-04 ENCOUNTER — Ambulatory Visit: Payer: Medicaid Other | Admitting: Family Medicine

## 2017-05-18 ENCOUNTER — Ambulatory Visit: Payer: Self-pay | Attending: Family Medicine | Admitting: Family Medicine

## 2017-05-18 ENCOUNTER — Encounter: Payer: Self-pay | Admitting: Family Medicine

## 2017-05-18 VITALS — BP 91/60 | HR 131 | Temp 98.1°F | Resp 18 | Ht 62.0 in | Wt 259.6 lb

## 2017-05-18 DIAGNOSIS — R0689 Other abnormalities of breathing: Secondary | ICD-10-CM

## 2017-05-18 DIAGNOSIS — Z87898 Personal history of other specified conditions: Secondary | ICD-10-CM

## 2017-05-18 DIAGNOSIS — Z79899 Other long term (current) drug therapy: Secondary | ICD-10-CM | POA: Insufficient documentation

## 2017-05-18 DIAGNOSIS — J069 Acute upper respiratory infection, unspecified: Secondary | ICD-10-CM

## 2017-05-18 DIAGNOSIS — R0602 Shortness of breath: Secondary | ICD-10-CM

## 2017-05-18 DIAGNOSIS — H66002 Acute suppurative otitis media without spontaneous rupture of ear drum, left ear: Secondary | ICD-10-CM

## 2017-05-18 MED ORDER — PHENYLEPHRINE-CHLORPHEN-DM 5-2-15 MG/5ML PO SYRP: 5.0000 mL | ORAL_SOLUTION | Freq: Four times a day (QID) | ORAL | 0 refills | Status: DC | PRN

## 2017-05-18 MED ORDER — FLUTICASONE PROPIONATE 50 MCG/ACT NA SUSP: 2.0000 | Freq: Every day | NASAL | 3 refills | Status: DC

## 2017-05-18 MED ORDER — IPRATROPIUM-ALBUTEROL 0.5-2.5 (3) MG/3ML IN SOLN: 3.0000 mL | RESPIRATORY_TRACT | Status: DC | PRN

## 2017-05-18 MED ORDER — AMOXICILLIN-POT CLAVULANATE 875-125 MG PO TABS: 1.0000 | ORAL_TABLET | Freq: Two times a day (BID) | ORAL | 0 refills | Status: DC

## 2017-05-18 MED ORDER — ALBUTEROL SULFATE HFA 108 (90 BASE) MCG/ACT IN AERS: 2.0000 | INHALATION_SPRAY | Freq: Four times a day (QID) | RESPIRATORY_TRACT | 0 refills | Status: DC | PRN

## 2017-05-18 NOTE — Progress Notes (Signed)
Subjective:  Patient ID: Amber Arroyo, female    DOB: [birth year]  Age: 21 y.o. MRN: [id number]  CC: URI   HPI Amber Lewandowskyyra Grindstaff presents for follow up for complains of symptoms of a URI. Symptoms include congestion, cough, plugged sensation in the left ear and sore throat. Onset of symptoms was 3 weeks ago, gradually worsening since that time. She also c/o cough described as mostly dry with some episodes of productive cough with scant yellow to clear sputum, nasal congestion, shortness of breath and c/o of wheezing first 3 days of URI symptoms onset . She denies any history of asthma. She is drinking moderate amounts of fluids. She reports taking benzoate capsules for symptoms with some relief and DayQuil with minimal relief of symptoms. Evaluation to date: none. Treatment to date: none.    Outpatient Medications Prior to Visit  Medication Sig Dispense Refill  . benzonatate (TESSALON) 100 MG capsule Take by mouth 3 (three) times daily as needed.    . loratadine (CLARITIN) 10 MG tablet take 1 tablet by mouth once daily 30 tablet 2  . metroNIDAZOLE (METROGEL VAGINAL) 0.75 % vaginal gel Place 1 Applicatorful vaginally at bedtime. 70 g 0  . triamcinolone cream (KENALOG) 0.1 % apply to affected area twice a day 454 g 0  . fluticasone (FLONASE) 50 MCG/ACT nasal spray Place 2 sprays into both nostrils daily. 16 g 3  . promethazine (PHENERGAN) 25 MG tablet Take 1 tablet (25 mg total) by mouth every 8 (eight) hours as needed for nausea or vomiting. (Patient not taking: Reported on 02/24/2017) 20 tablet 0   No facility-administered medications prior to visit.     ROS Review of Systems  Constitutional: Positive for fatigue.  HENT: Positive for congestion, rhinorrhea and sore throat.        Ear pressure  Respiratory: Positive for shortness of breath and wheezing.   Cardiovascular: Negative.     Objective:  BP 91/60 (BP Location: Left Arm, Patient Position: Sitting, Cuff Size: Normal)   Pulse (!) 131    Temp 98.1 F (36.7 C) (Oral)   Resp 18   Ht 5\' 2"  (1.575 m)   Wt 259 lb 9.6 oz (117.8 kg)   SpO2 98%   BMI 47.48 kg/m   BP/Weight 05/18/2017 02/24/2017 12/15/2016  Systolic BP 91 118 108  Diastolic BP 60 78 74  Wt. (Lbs) 259.6 255.6 245.2  BMI 47.48 45.28 43.44     Physical Exam  Constitutional: She appears well-developed and well-nourished. She has a sickly appearance.  HENT:  Right Ear: Hearing, tympanic membrane, external ear and ear canal normal. No drainage. No decreased hearing is noted.  Left Ear: Ear canal normal. There is swelling. No drainage. Tympanic membrane is erythematous. Decreased hearing (Muffled) is noted.  Nose: Mucosal edema and rhinorrhea present.  Mouth/Throat: Posterior oropharyngeal erythema (Mild) present. No oropharyngeal exudate or posterior oropharyngeal edema.  Eyes: Conjunctivae are normal. Pupils are equal, round, and reactive to light.  Neck: No JVD present.  Cardiovascular: Normal rate, regular rhythm, normal heart sounds and intact distal pulses.  Pulmonary/Chest: Effort normal. Tachypnea noted. She has decreased breath sounds (Bilateral lower and upper lobes.). She has no wheezes. She has no rhonchi.  Abdominal: Soft. Bowel sounds are normal.  Skin: Skin is warm and dry.  Nursing note and vitals reviewed.   Assessment & Plan:   1. Upper respiratory tract infection, unspecified type Patient appeared to have dyspnea with tachycardia and breathing treatment was given. Lung fields initially diminished  prior to breathing treatment after breathing treatment lung sounds improved with clearing. - ipratropium-albuterol (DUONEB) 0.5-2.5 (3) MG/3ML nebulizer solution 3 mL - CBC - Respiratory virus panel - amoxicillin-clavulanate (AUGMENTIN) 875-125 MG tablet; Take 1 tablet by mouth 2 (two) times daily.  Dispense: 14 tablet; Refill: 0 - DG Chest 2 View; Future - fluticasone (FLONASE) 50 MCG/ACT nasal spray; Place 2 sprays into both nostrils daily.   Dispense: 16 g; Refill: 3 - Phenylephrine-Chlorphen-DM 10-21-13 MG/5ML SYRP; Take 5 mLs by mouth every 6 (six) hours as needed.  Dispense: 473 mL; Refill: 0  2. Acute suppurative otitis media of left ear without spontaneous rupture of tympanic membrane, recurrence not specified  - amoxicillin-clavulanate (AUGMENTIN) 875-125 MG tablet; Take 1 tablet by mouth 2 (two) times daily.  Dispense: 14 tablet; Refill: 0  3. Decreased lung sounds  - ipratropium-albuterol (DUONEB) 0.5-2.5 (3) MG/3ML nebulizer solution 3 mL - DG Chest 2 View; Future  4. History of wheezing  - albuterol (PROVENTIL HFA;VENTOLIN HFA) 108 (90 Base) MCG/ACT inhaler; Inhale 2 puffs into the lungs every 6 (six) hours as needed for wheezing or shortness of breath.  Dispense: 1 Inhaler; Refill: 0  5. SOB (shortness of breath)  - albuterol (PROVENTIL HFA;VENTOLIN HFA) 108 (90 Base) MCG/ACT inhaler; Inhale 2 puffs into the lungs every 6 (six) hours as needed for wheezing or shortness of breath.  Dispense: 1 Inhaler; Refill: 0     Follow-up: Return if symptoms worsen or fail to improve.   Lizbeth BarkMandesia R Shawonda Kerce FNP

## 2017-05-18 NOTE — Progress Notes (Signed)
Patient is here for cold SX

## 2017-05-18 NOTE — Patient Instructions (Signed)
Upper Respiratory Infection, Adult Most upper respiratory infections (URIs) are caused by a virus. A URI affects the nose, throat, and upper air passages. The most common type of URI is often called "the common cold." Follow these instructions at home:  Take medicines only as told by your doctor.  Gargle warm saltwater or take cough drops to comfort your throat as told by your doctor.  Use a warm mist humidifier or inhale steam from a shower to increase air moisture. This may make it easier to breathe.  Drink enough fluid to keep your pee (urine) clear or pale yellow.  Eat soups and other clear broths.  Have a healthy diet.  Rest as needed.  Go back to work when your fever is gone or your doctor says it is okay. ? You may need to stay home longer to avoid giving your URI to others. ? You can also wear a face mask and wash your hands often to prevent spread of the virus.  Use your inhaler more if you have asthma.  Do not use any tobacco products, including cigarettes, chewing tobacco, or electronic cigarettes. If you need help quitting, ask your doctor. Contact a doctor if:  You are getting worse, not better.  Your symptoms are not helped by medicine.  You have chills.  You are getting more short of breath.  You have brown or red mucus.  You have yellow or brown discharge from your nose.  You have pain in your face, especially when you bend forward.  You have a fever.  You have puffy (swollen) neck glands.  You have pain while swallowing.  You have white areas in the back of your throat. Get help right away if:  You have very bad or constant: ? Headache. ? Ear pain. ? Pain in your forehead, behind your eyes, and over your cheekbones (sinus pain). ? Chest pain.  You have long-lasting (chronic) lung disease and any of the following: ? Wheezing. ? Long-lasting cough. ? Coughing up blood. ? A change in your usual mucus.  You have a stiff neck.  You have  changes in your: ? Vision. ? Hearing. ? Thinking. ? Mood. This information is not intended to replace advice given to you by your health care provider. Make sure you discuss any questions you have with your health care provider. Document Released: 11/25/2007 Document Revised: 02/09/2016 Document Reviewed: 09/13/2013 Elsevier Interactive Patient Education  2018 Elsevier Inc.  

## 2017-05-19 LAB — CBC
Hematocrit: 39.9 % (ref 34.0–46.6)
Hemoglobin: 12.9 g/dL (ref 11.1–15.9)
MCH: 25.7 pg — ABNORMAL LOW (ref 26.6–33.0)
MCHC: 32.3 g/dL (ref 31.5–35.7)
MCV: 80 fL (ref 79–97)
PLATELETS: 275 10*3/uL (ref 150–379)
RBC: 5.02 x10E6/uL (ref 3.77–5.28)
RDW: 15 % (ref 12.3–15.4)
WBC: 13.3 10*3/uL — ABNORMAL HIGH (ref 3.4–10.8)

## 2017-05-20 ENCOUNTER — Telehealth: Payer: Self-pay | Admitting: Family Medicine

## 2017-05-20 ENCOUNTER — Other Ambulatory Visit: Payer: Self-pay | Admitting: Family Medicine

## 2017-05-20 LAB — RESPIRATORY VIRUS PANEL
Adenovirus: POSITIVE — AB
Influenza A: NEGATIVE
Influenza B: NEGATIVE
Metapneumovirus: NEGATIVE
PARAINFLUENZA 2 A: NEGATIVE
PARAINFLUENZA 3 A: NEGATIVE
Parainfluenza 1: NEGATIVE
RESPIRATORY SYNCYTIAL VIRUS A: NEGATIVE
RESPIRATORY SYNCYTIAL VIRUS B: NEGATIVE
RHINOVIRUS: POSITIVE — AB

## 2017-05-20 NOTE — Telephone Encounter (Signed)
Called and left message for call back.

## 2017-05-24 NOTE — Telephone Encounter (Signed)
-----   Message from Lizbeth BarkMandesia R Hairston, FNP sent at 05/21/2017  9:42 AM EST ----- Positive for rhinovirus and adenoviruses. Rhinovirus is the cause of the common cold. Adenoviruses can cause pneumonia. Complete your course of antibiotics. Continue to take medications as prescribed, increase fluid intake, rest, and practice good handwashing to prevent spread.  White blood cell count is elevated this can happen with infection. Follow up if symptoms worsen or fail to improve.

## 2017-05-24 NOTE — Telephone Encounter (Signed)
CMA call regarding lab results   Patient Verify DOB   Patient was aware and understood  

## 2017-05-28 ENCOUNTER — Telehealth: Payer: Self-pay | Admitting: *Deleted

## 2017-05-28 NOTE — Telephone Encounter (Signed)
Notes recorded by Lizbeth BarkHairston, Mandesia R, FNP on 05/21/2017 at 9:42 AM EST Positive for rhinovirus and adenoviruses. Rhinovirus is the cause of the common cold. Adenoviruses can cause pneumonia. Complete your course of antibiotics. Continue to take medications as prescribed, increase fluid intake, rest, and practice good handwashing to prevent spread.  White blood cell count is elevated this can happen with infection. Follow up if symptoms worsen or fail to improve.   Unable to disclose results. Left message on voicemail to return call.

## 2017-06-02 ENCOUNTER — Ambulatory Visit: Payer: Medicaid Other | Admitting: Family Medicine

## 2017-06-02 ENCOUNTER — Other Ambulatory Visit: Payer: Self-pay | Admitting: Pharmacist

## 2017-06-02 DIAGNOSIS — L308 Other specified dermatitis: Secondary | ICD-10-CM

## 2017-06-02 MED ORDER — TRIAMCINOLONE ACETONIDE 0.1 % EX CREA: TOPICAL_CREAM | CUTANEOUS | 0 refills | Status: DC

## 2017-06-02 NOTE — Telephone Encounter (Signed)
Unable to reach patient. Left message on voicemail to return call. Needs results from recent labs.

## 2017-06-29 ENCOUNTER — Other Ambulatory Visit: Payer: Self-pay | Admitting: Family Medicine

## 2017-07-03 ENCOUNTER — Other Ambulatory Visit: Payer: Self-pay

## 2017-07-03 ENCOUNTER — Emergency Department (HOSPITAL_BASED_OUTPATIENT_CLINIC_OR_DEPARTMENT_OTHER)
Admit: 2017-07-03 | Discharge: 2017-07-03 | Disposition: A | Discharge status: Home / Self Care | Payer: BLUE CROSS/BLUE SHIELD

## 2017-07-03 ENCOUNTER — Observation Stay (HOSPITAL_COMMUNITY)
Admission: EM | Admit: 2017-07-03 | Discharge: 2017-07-04 | Disposition: A | Discharge status: Home / Self Care | Payer: BLUE CROSS/BLUE SHIELD | Attending: Internal Medicine | Admitting: Internal Medicine

## 2017-07-03 ENCOUNTER — Encounter (HOSPITAL_COMMUNITY): Payer: Self-pay | Admitting: Emergency Medicine

## 2017-07-03 DIAGNOSIS — R000 Tachycardia, unspecified: Secondary | ICD-10-CM

## 2017-07-03 DIAGNOSIS — R238 Other skin changes: Secondary | ICD-10-CM | POA: Diagnosis not present

## 2017-07-03 DIAGNOSIS — M79609 Pain in unspecified limb: Secondary | ICD-10-CM

## 2017-07-03 DIAGNOSIS — L814 Other melanin hyperpigmentation: Secondary | ICD-10-CM | POA: Diagnosis not present

## 2017-07-03 DIAGNOSIS — J45909 Unspecified asthma, uncomplicated: Secondary | ICD-10-CM | POA: Diagnosis not present

## 2017-07-03 DIAGNOSIS — Z872 Personal history of diseases of the skin and subcutaneous tissue: Secondary | ICD-10-CM

## 2017-07-03 DIAGNOSIS — L03115 Cellulitis of right lower limb: Secondary | ICD-10-CM | POA: Diagnosis not present

## 2017-07-03 DIAGNOSIS — R112 Nausea with vomiting, unspecified: Secondary | ICD-10-CM | POA: Diagnosis not present

## 2017-07-03 DIAGNOSIS — Z840 Family history of diseases of the skin and subcutaneous tissue: Secondary | ICD-10-CM

## 2017-07-03 DIAGNOSIS — L0390 Cellulitis, unspecified: Secondary | ICD-10-CM | POA: Diagnosis present

## 2017-07-03 DIAGNOSIS — M7989 Other specified soft tissue disorders: Secondary | ICD-10-CM

## 2017-07-03 DIAGNOSIS — L309 Dermatitis, unspecified: Secondary | ICD-10-CM | POA: Diagnosis present

## 2017-07-03 DIAGNOSIS — Z825 Family history of asthma and other chronic lower respiratory diseases: Secondary | ICD-10-CM

## 2017-07-03 DIAGNOSIS — E669 Obesity, unspecified: Secondary | ICD-10-CM | POA: Diagnosis not present

## 2017-07-03 DIAGNOSIS — Z6841 Body Mass Index (BMI) 40.0 and over, adult: Secondary | ICD-10-CM | POA: Diagnosis not present

## 2017-07-03 DIAGNOSIS — L209 Atopic dermatitis, unspecified: Secondary | ICD-10-CM | POA: Insufficient documentation

## 2017-07-03 LAB — CBC WITH DIFFERENTIAL/PLATELET
BASOS ABS: 0 10*3/uL (ref 0.0–0.1)
BASOS PCT: 0 %
Eosinophils Absolute: 0.1 10*3/uL (ref 0.0–0.7)
Eosinophils Relative: 1 %
HEMATOCRIT: 37 % (ref 36.0–46.0)
HEMOGLOBIN: 12.1 g/dL (ref 12.0–15.0)
Lymphocytes Relative: 15 %
Lymphs Abs: 1.9 10*3/uL (ref 0.7–4.0)
MCH: 26.3 pg (ref 26.0–34.0)
MCHC: 32.7 g/dL (ref 30.0–36.0)
MCV: 80.4 fL (ref 78.0–100.0)
Monocytes Absolute: 0.7 10*3/uL (ref 0.1–1.0)
Monocytes Relative: 5 %
NEUTROS ABS: 10.2 10*3/uL — AB (ref 1.7–7.7)
NEUTROS PCT: 79 %
Platelets: 236 10*3/uL (ref 150–400)
RBC: 4.6 MIL/uL (ref 3.87–5.11)
RDW: 15.6 % — ABNORMAL HIGH (ref 11.5–15.5)
WBC: 12.8 10*3/uL — ABNORMAL HIGH (ref 4.0–10.5)

## 2017-07-03 LAB — I-STAT BETA HCG BLOOD, ED (MC, WL, AP ONLY): I-stat hCG, quantitative: <5 m[IU]/mL (ref <5)

## 2017-07-03 LAB — BASIC METABOLIC PANEL
ANION GAP: 9 (ref 5–15)
BUN: 16 mg/dL (ref 6–20)
CALCIUM: 8.8 mg/dL — AB (ref 8.9–10.3)
CO2: 27 mmol/L (ref 22–32)
CREATININE: 0.75 mg/dL (ref 0.44–1.00)
Chloride: 103 mmol/L (ref 101–111)
Glucose, Bld: 86 mg/dL (ref 65–99)
Potassium: 3.5 mmol/L (ref 3.5–5.1)
Sodium: 139 mmol/L (ref 135–145)

## 2017-07-03 MED ORDER — VANCOMYCIN HCL IN DEXTROSE 1-5 GM/200ML-% IV SOLN
1000.0000 mg | Freq: Three times a day (TID) | INTRAVENOUS | Status: DC
Start: 2017-07-04 — End: 2017-07-03

## 2017-07-03 MED ORDER — DEXTROSE 5 % IV SOLN
2.0000 g | INTRAVENOUS | Status: DC
  Administered 2017-07-04: 2 g via INTRAVENOUS
  Filled 2017-07-03 (×2): qty 2

## 2017-07-03 MED ORDER — ACETAMINOPHEN 650 MG RE SUPP: 650.0000 mg | Freq: Four times a day (QID) | RECTAL | Status: DC | PRN

## 2017-07-03 MED ORDER — ENOXAPARIN SODIUM 40 MG/0.4ML ~~LOC~~ SOLN
40.0000 mg | SUBCUTANEOUS | Status: DC
  Administered 2017-07-04: 40 mg via SUBCUTANEOUS
  Filled 2017-07-03: qty 0.4

## 2017-07-03 MED ORDER — SENNOSIDES-DOCUSATE SODIUM 8.6-50 MG PO TABS: 1.0000 | ORAL_TABLET | Freq: Every evening | ORAL | Status: DC | PRN

## 2017-07-03 MED ORDER — OXYCODONE HCL 5 MG PO TABS: 5.0000 mg | ORAL_TABLET | Freq: Four times a day (QID) | ORAL | Status: DC | PRN

## 2017-07-03 MED ORDER — ACETAMINOPHEN 325 MG PO TABS
650.0000 mg | ORAL_TABLET | Freq: Four times a day (QID) | ORAL | Status: DC | PRN
Start: 2017-07-03 — End: 2017-07-04
  Administered 2017-07-03: 650 mg via ORAL
  Filled 2017-07-03: qty 2

## 2017-07-03 MED ORDER — SODIUM CHLORIDE 0.9 % IV SOLN
2500.0000 mg | Freq: Once | INTRAVENOUS | Status: AC
  Administered 2017-07-03: 2500 mg via INTRAVENOUS
  Filled 2017-07-03: qty 2500
  Filled 2017-07-03: qty 2000

## 2017-07-03 MED ORDER — OXYCODONE HCL 5 MG PO TABS: 5.0000 mg | ORAL_TABLET | ORAL | Status: DC | PRN

## 2017-07-03 NOTE — Progress Notes (Signed)
Amber Arroyo is a 22 y.o. female patient admitted from ED awake, alert - oriented  X 4 - no acute distress noted.  VSS - Blood pressure 123/74, pulse (!) 107, temperature 98.9 F (37.2 C), temperature source Oral, resp. rate 18, height 5\' 3"  (1.6 m), weight 117.8 kg (259 lb 11.2 oz), SpO2 99 %.    IV in place, occlusive dsg intact without redness.    Will cont to eval and treat per MD orders.  Rolland PorterJosephine M Jomaira Darr, RN 07/03/2017 11:14 PM

## 2017-07-03 NOTE — ED Notes (Signed)
Waiting on vanc from pharmacy. Will administer once available to give.

## 2017-07-03 NOTE — Progress Notes (Signed)
VASCULAR LAB PRELIMINARY  PRELIMINARY  PRELIMINARY  PRELIMINARY  Right lower extremity venous duplex completed.    Preliminary report:  There is no DVT or SVT noted in the right lower extremity.  There are multiple enlarged inguinal lymph nodes noted.   Gave report to Dr. Johann CapersPickering  Paarth Cropper, [facility]AND[facility], RVT 07/03/2017, 7:29 PM

## 2017-07-03 NOTE — ED Provider Notes (Signed)
[facility]  SURGICAL Provider Note   CSN: [id number] Arrival date & time: 07/03/17  1438     History   Chief Complaint Chief Complaint  Patient presents with  . Leg Swelling    HPI Amber Arroyo is a 22 y.o. female with a past medical history of eczema and obesity who presents the emergency department with chief complaint of right leg pain and swelling.  Patient states that 2 days days ago she had fever and chills.  She woke up yesterday with redness to her right leg.  Has progressively worsened.  She complains of pain when she ambulates on the leg.  She denies a history of blood clots.  She has a history of previous episodes of cellulitis that has required admission.  HPI  Past Medical History:  Diagnosis Date  . Allergy    eczema, seasonal allergies  . Eczema   . Obesity     Patient Active Problem List   Diagnosis Date Noted  . Cellulitis 07/03/2017  . Seasonal allergies 02/10/2016  . Eczema 01/07/2016  . Obesity 01/07/2016  . Abscess of neck 02/04/2013    Past Surgical History:  Procedure Laterality Date  . INCISION AND DRAINAGE ABSCESS Left 02/04/2013   Procedure: INCISION AND DRAINAGE POSTERIOR NECK ABSCESS;  Surgeon: Flo ShanksKarol Wolicki, MD;  [facility] OR;  Service: ENT;  Laterality: Left;  . MOUTH SURGERY     Wisdom Teeth Extraction    OB History    No data available       Home Medications    Prior to Admission medications   Not on File    Family History Family History  Problem Relation Age of Onset  . Hypertension Mother   . Asthma Mother   . Eczema Mother   . Hypertension Father     Social History Social History   Tobacco Use  . Smoking status: Never Smoker  . Smokeless tobacco: Never Used  Substance Use Topics  . Alcohol use: No  . Drug use: No     Allergies   Patient has no known allergies.   Review of Systems Review of Systems Ten systems reviewed and are negative for acute change, except as noted in  the HPI.    Physical Exam Updated Vital Signs BP 123/74 (BP Location: Left Arm)   Pulse (!) 107   Temp 98.9 F (37.2 C) (Oral)   Resp 18   Ht 5\' 3"  (1.6 m)   Wt 117.8 kg (259 lb 11.2 oz)   SpO2 99%   BMI 46.00 kg/m   Physical Exam  Constitutional: She is oriented to person, place, and time. She appears well-developed and well-nourished. No distress.  HENT:  Head: Normocephalic and atraumatic.  Eyes: Conjunctivae are normal. No scleral icterus.  Neck: Normal range of motion.  Cardiovascular: Normal rate, regular rhythm and normal heart sounds. Exam reveals no gallop and no friction rub.  No murmur heard. Pulmonary/Chest: Effort normal and breath sounds normal. No respiratory distress.  Abdominal: Soft. Bowel sounds are normal. She exhibits no distension and no mass. There is no tenderness. There is no guarding.  Musculoskeletal:  Erythema and swelling of the right lower extremity with lymphangitis of the medial thigh.  Tender to palpation.  Normal DP and PT pulses bilaterally.  Neurological: She is alert and oriented to person, place, and time.  Skin: Skin is warm and dry. She is not diaphoretic.  Psychiatric: Her behavior is normal.  Nursing note and vitals  reviewed.        ED Treatments / Results  Labs (all labs ordered are listed, but only abnormal results are displayed) Labs Reviewed  CBC WITH DIFFERENTIAL/PLATELET - Abnormal; Notable for the following components:      Result Value   WBC 12.8 (*)    RDW 15.6 (*)    Neutro Abs 10.2 (*)    All other components within normal limits  BASIC METABOLIC PANEL - Abnormal; Notable for the following components:   Calcium 8.8 (*)    All other components within normal limits  CBC  I-STAT BETA HCG BLOOD, ED ([facility], [facility], [facility] ONLY)  CBG MONITORING, ED    EKG  EKG Interpretation None       Radiology No results found.  Procedures Procedures (including critical care time)  Medications Ordered in ED Medications    enoxaparin (LOVENOX) injection 40 mg (not administered)  acetaminophen (TYLENOL) tablet 650 mg (650 mg Oral Given 07/03/17 2326)    Or  acetaminophen (TYLENOL) suppository 650 mg ( Rectal See Alternative 07/03/17 2326)  senna-docusate (Senokot-S) tablet 1 tablet (not administered)  cefTRIAXone (ROCEPHIN) 2 g in dextrose 5 % 50 mL IVPB (not administered)  oxyCODONE (Oxy IR/ROXICODONE) immediate release tablet 5 mg (not administered)  vancomycin (VANCOCIN) 2,500 mg in sodium chloride 0.9 % 500 mL IVPB (0 mg Intravenous Stopped 07/03/17 2222)     Initial Impression / Assessment and Plan / ED Course  I have reviewed the triage vital signs and the nursing notes.  Pertinent labs & imaging results that were available during my care of the patient were reviewed by me and considered in my medical decision making (see chart for details).     Patient with cellulitis.  Awaiting US of the leg, although I doubt DVT.   Patient with negative ultrasound.  She will be admitted for cellulitis.  Staples are ED visit.  Final Clinical Impressions(s) / ED Diagnoses   Final diagnoses:  Cellulitis of right lower extremity    ED Discharge Orders    None       Arthor Captain, PA-C 07/03/17 2329    Benjiman Core, MD 07/04/17 0028

## 2017-07-03 NOTE — Progress Notes (Addendum)
Pharmacy Antibiotic Note  Phillis Haggisyra Jennette Kettleeal is a 22 y.o. female admitted on 07/03/2017 with right leg swelling. Pharmacy has been consulted for dosing for Cellulitis.  BMET pending. No h/o renal insufficiency noted.   Plan: Vancomycin 2500 mg IV x1 loading dose. Then vancomycin 1000 mg IV q8h.  F/u on BMET tonight and adjust maintenance dose if needed.  Monitor daily clinical status, renal function,  Steady state Vanc trough per protocol.    Height: 5\' 3"  (160 cm) Weight: 250 lb (113.4 kg) IBW/kg (Calculated) : 52.4  Temp (24hrs), Avg:97.8 F (36.6 C), Min:97.5 F (36.4 C), Max:98 F (36.7 C)  No results for input(s): WBC, CREATININE, LATICACIDVEN, VANCOTROUGH, VANCOPEAK, VANCORANDOM, GENTTROUGH, GENTPEAK, GENTRANDOM, TOBRATROUGH, TOBRAPEAK, TOBRARND, AMIKACINPEAK, AMIKACINTROU, AMIKACIN in the last 168 hours.  CrCl cannot be calculated (Patient's most recent lab result is older than the maximum 21 days allowed.).    No Known Allergies  Antimicrobials this admission: Vancomycin 1/12>>  Dose adjustments this admission:   Microbiology results: Thank you for allowing pharmacy to be a part of this patient's care.  Noah Delaineuth Clark, RPh Clinical Pharmacist 07/03/2017 7:07 PM   ADDENDUM Consult changed from vancomycin to ceftriaxone. Given body weight>90 kg, will order ceftriaxone 2 g every 24 hours for cellulitis. Monitor clinical picture, cx results, and LOT.  Girard CooterKimberly Perkins, PharmD Clinical Pharmacist  Pager: [phone] Phone: [phone]

## 2017-07-03 NOTE — ED Triage Notes (Signed)
Pt presents to ED for assessment of right leg swelling, pitting in nature.  States "I have eczema and this happens from time to time".  States she's also had on and off again cold symptoms with nasal congestion and cough.

## 2017-07-03 NOTE — H&P (Signed)
Date: 07/03/2017               Patient Name:  Amber Arroyo MRN: [id number]  DOB: September 11, 1995 Age / Sex: 22 y.o., female   PCP: Jaclyn ShaggyAmao, Enobong, MD         Medical Service: Internal Medicine Teaching Service         Attending Physician: Dr. Benjiman CorePickering, Nathan, MD    First Contact: Dr. Saunders RevelNedrud Pager: [phone]  Second Contact: Dr. Obie DredgeBlum Pager: [phone]       After Hours (After 5p/  First Contact Pager: [phone]  weekends / holidays): Second Contact Pager: [phone]   Chief Complaint: Leg Swelling  History of Present Illness: Amber Arroyo is a 22 yo F with a past medical history of eczema, obesity, asthma who presents to the ED with complaints of R leg pain and swelling.   She reports a one day history of pain, redness, and swelling to her R lower leg. She denies drainage, no cuts or injuries prior to onset. She noticed only a small red area yesterday morning and her sx quickly progressed to their present state. She had subjective fevers and chills, and generalized body aches. She also reports nausea with multiple episodes of vomiting which have since resolved- she denies abdominal pain, diarrhea, changes in bowel movements. Able to tolerate PO intake and had a chicken dinner prior to coming to ED.   She has a history of eczema which she reports using moisturizers and triamcinolone for, though she does not feel the triamcinolone is effective. She has noticed her feet have been more itchy recently which is a common area for her eczema, also notes her sx often worsen with cold weather. She has a history of similar presentations with cellulitis, though she states her current sx were more sudden in onset. She has been treated with outpatient courses of antibiotics in February and May of 2018, July 2017 on chart review.   In the ED, T 98 F, HR 106, BP 154/99, 99% on RA. Labs notable for mild leukocytosis of 12.8, otherwise unremarkable. LE doppler negative for DVT, noted multiple enlarged lymph nodes. She was  started on IV Vancomycin and admitted for further management.   Meds:  Current Facility-Administered Medications for the 07/03/17 encounter [facility](Hospital Encounter)  Medication  . ipratropium-albuterol (DUONEB) 0.5-2.5 (3) MG/3ML nebulizer solution 3 mL   No outpatient medications have been marked as taking for the 07/03/17 encounter [facility](Hospital Encounter).     Allergies: Allergies as of 07/03/2017  . (No Known Allergies)   Past Medical History:  Diagnosis Date  . Allergy    eczema, seasonal allergies  . Eczema   . Obesity     Family History:  Family History  Problem Relation Age of Onset  . Hypertension Mother   . Asthma Mother   . Eczema Mother   . Hypertension Father      Social History:  Social History   Tobacco Use  . Smoking status: Never Smoker  . Smokeless tobacco: Never Used  Substance Use Topics  . Alcohol use: No  . Drug use: No     Review of Systems: A complete ROS was negative except as per HPI.   Physical Exam: Blood pressure 114/66, pulse (!) 105, temperature (!) 97.5 F (36.4 C), temperature source Oral, resp. rate 20, height 5\' 3"  (1.6 m), weight 250 lb (113.4 kg), SpO2 97 %. General: Young female resting in bed comfortably, no acute distress Head: Normocephalic, atraumatic Eyes: PERRL, normal  conjuctiva  ENT: Moist mucus membranes, no exudate  CV: Slightly tachycardic, s1, s2, no murmur appreciated Resp: Clear breath sounds bilaterally, normal work of breathing, no distress  Abd: Soft, obese, +BS, non-tender to palpation in all quadrants Extr: R LE edema  Neuro: Alert and oriented x3  Skin: R LE edema and erythema from ankle to just distal to knee with extension along medial thigh. Various areas of hyperpigmented, dry, scaly skin incl bilateral ankles, toes, wrists. Good distal pulses and sensation. See media tab for clinical photo    Assessment & Plan by Problem:  Right LE Cellulitis, H/o Atopic Dermatitis   Pt presenting with R LE  cellulitis in the setting of poorly controlled atopic dermatitis putting her at greater risk for skin infection. She has a mild leukocytosis and is slightly tachycardic, currently afebrile. No evidence of purulence on exam. She has received IV Vancomycin, but we will transition to Ceftriaxone given the absence of purulence or pustules. She may benefit from dermatology referral as an outpatient for consideration of an escalation in management of her eczema --Monitor vital signs, fever curve --Transition to Ceftriaxone --CBC --Pain control: Tylenol 650 mg q6hr prn, oxycodone 5 mg q4hr prn      Nausea, Vomiting- resolved Pt notes a history of nausea and vomiting prior to presenting to the hospital which has no resolved, no abdominal pain or other bowel sx, tolerating normal PO intake. May have been food related or viral in nature. Will monitor for recurrent sx.   Dispo: Admit patient to Observation with expected length of stay less than 2 midnights.  Signed: Ginger Carne, MD 07/03/2017, 8:45 PM  Pager: [phone]

## 2017-07-03 NOTE — ED Notes (Signed)
Attempted to call report

## 2017-07-03 NOTE — ED Notes (Signed)
Patient transported to Ultrasound 

## 2017-07-04 ENCOUNTER — Other Ambulatory Visit: Payer: Self-pay

## 2017-07-04 DIAGNOSIS — L03115 Cellulitis of right lower limb: Principal | ICD-10-CM

## 2017-07-04 DIAGNOSIS — E669 Obesity, unspecified: Secondary | ICD-10-CM

## 2017-07-04 DIAGNOSIS — L209 Atopic dermatitis, unspecified: Secondary | ICD-10-CM | POA: Diagnosis not present

## 2017-07-04 DIAGNOSIS — Z6841 Body Mass Index (BMI) 40.0 and over, adult: Secondary | ICD-10-CM

## 2017-07-04 DIAGNOSIS — J45909 Unspecified asthma, uncomplicated: Secondary | ICD-10-CM | POA: Diagnosis not present

## 2017-07-04 LAB — CBC
HEMATOCRIT: 37.9 % (ref 36.0–46.0)
HEMOGLOBIN: 12.1 g/dL (ref 12.0–15.0)
MCH: 25.9 pg — AB (ref 26.0–34.0)
MCHC: 31.9 g/dL (ref 30.0–36.0)
MCV: 81 fL (ref 78.0–100.0)
Platelets: 213 10*3/uL (ref 150–400)
RBC: 4.68 MIL/uL (ref 3.87–5.11)
RDW: 15.9 % — ABNORMAL HIGH (ref 11.5–15.5)
WBC: 10 10*3/uL (ref 4.0–10.5)

## 2017-07-04 MED ORDER — CEPHALEXIN 500 MG PO CAPS: 500.0000 mg | ORAL_CAPSULE | Freq: Four times a day (QID) | ORAL | 0 refills | Status: AC

## 2017-07-04 MED ORDER — BENZONATATE 100 MG PO CAPS
100.0000 mg | ORAL_CAPSULE | Freq: Two times a day (BID) | ORAL | Status: DC | PRN
  Administered 2017-07-04: 100 mg via ORAL
  Filled 2017-07-04: qty 1

## 2017-07-04 MED ORDER — EUCERIN EX CREA: TOPICAL_CREAM | CUTANEOUS | 3 refills | Status: DC | PRN

## 2017-07-04 NOTE — Discharge Summary (Signed)
Name: Amber Arroyo MRN: [id number] DOB: 1995/08/22 21 y.o. PCP: Jaclyn ShaggyAmao, Enobong, MD  Date of Admission: 07/03/2017  5:36 PM Date of Discharge:  Attending Physician: Anne Shutteraines, Alexander N, MD  Discharge Diagnosis:  Active Problems:   Cellulitis  Discharge Medications: Allergies as of 07/04/2017   No Known Allergies     Medication List    TAKE these medications   cephALEXin 500 MG capsule Commonly known as:  KEFLEX Take 1 capsule (500 mg total) by mouth 4 (four) times daily for 6 days.   eucerin cream Apply topically as needed for dry skin.       Disposition and follow-up:   Amber Arroyo was [facility] in Stable condition.  At the hospital follow up visit please address:  1.  The patient presented with acute onset R lower extremity non-purulent cellulitis which was improved with IV antibiotic therapy. Patient given a 6 day course of Keflex upon discharge (total of 7 days of antibiotic treatment). She already had follow scheduled within 1 week of discharge with PCP. Please assess for clinical resolution of RLE erythema and RLE swelling on this antibiotic regimen.   Patient also given ambulatory referral to dermatology at follow up given her reports of recurrent skin infections 2/2 poorly controlled atopic dermatitis. Please assess her ability to establish with dermatology for improved control of baseline eczema. She was given prescription for Eucerin cream on discharge to assist with eczema prior to follow up with dermatologist. Please assess how she is doing on this medication.   2.  Labs / imaging needed at time of follow-up: None  3.  Pending labs/ test needing follow-up: None  Follow-up Appointments: Follow-up Information    Jaclyn ShaggyAmao, Enobong, MD. Go on 07/08/2017.   Specialty:  Family Medicine Why:  Please follow up with your primary care provider regarding your lower extremity cellulitis. You already have an appointment scheduled with them, so  please keep this appointment so she can check on resolution of your infection on oral antibiotics. Contact information: [address]  [place] [id number] [phone]           Hospital Course by problem list: Active Problems:   Cellulitis   Amber Arroyo is a 22 yo F with a past medical history of eczema, obesity, asthma who presented to the ED with complaints of R leg pain and swelling and was found to have non-purulent cellulitis on physical exam. She was admitted to the internal medicine teaching service for management. The specific problems addressed during admission are as follows:  Non-purulent RLECellulitis, complicated by history of atopic dermatitis: Patient reported acute onset of her RLE swelling, erythema and pain upon presentation to ED. Her clinical status improved with IV antibiotics which first included a dose of vancomycin in the ED that was later narrowed to an IV cephalosporin. She had mild leukocytosis on admission (WBC = 12.8) that resolved with IV antibiotics. Patient remained afebrile throughout hospitalization and hemodynamically stable. Her symptoms of RLE pain and swelling had improved overnight with IV antibiotic therapy. She was transitioned to oral Keflex to take q6 hours for 7 days. She was discharged to home with close PCP follow up, which patient stated was already scheduled within 1 week of discharge. At the time of PCP follow up if patient's infection has resolved antibiotics can be discontinued. At discharge, patient was given ambulatory referral to dermatology as she reported multiple, recurrent skin infections 2/2 skin breakdown with recurrent eczema flares.   Discharge  Vitals:   BP (!) 122/56 (BP Location: Left Arm)   Pulse 86   Temp 98.4 F (36.9 C) (Oral)   Resp 20   Ht 5\' 3"  (1.6 m)   Wt 259 lb 11.2 oz (117.8 kg)   SpO2 99%   BMI 46.00 kg/m   Pertinent Labs, Studies, and Procedures:  BMP Latest Ref Rng & Units 07/03/2017 11/03/2016  01/01/2016  Glucose 65 - 99 mg/dL 86 80 84  BUN 6 - 20 mg/dL 16 14 13   Creatinine 0.44 - 1.00 mg/dL 9.14 7.82 9.56  BUN/Creat Ratio 9 - 23 - 24(H) -  Sodium 135 - 145 mmol/L 139 142 138  Potassium 3.5 - 5.1 mmol/L 3.5 4.1 3.8  Chloride 101 - 111 mmol/L 103 103 105  CO2 22 - 32 mmol/L 27 25 26   Calcium 8.9 - 10.3 mg/dL 2.1(H) 9.4 0.8(M)   CBC Latest Ref Rng & Units 07/04/2017 07/03/2017 05/18/2017  WBC 4.0 - 10.5 K/uL 10.0 12.8(H) 13.3(H)  Hemoglobin 12.0 - 15.0 g/dL 57.8 46.9 62.9  Hematocrit 36.0 - 46.0 % 37.9 37.0 39.9  Platelets 150 - 400 K/uL 213 236 275   Discharge Instructions: Discharge Instructions    Ambulatory referral to Dermatology   Complete by:  As directed    Call MD for:  redness, tenderness, or signs of infection (pain, swelling, redness, odor or green/yellow discharge around incision site)   Complete by:  As directed    Call MD for:  severe uncontrolled pain   Complete by:  As directed    Call MD for:  temperature >100.4   Complete by:  As directed    Diet - low sodium heart healthy   Complete by:  As directed    Discharge instructions   Complete by:  As directed    You were evaluated for right lower extremity pain, swelling, and redness. Your workup and clinical exam are consistent with infection in the skin of your right lower extremity, most likely as a result of baseline uncontrolled eczema. You were given IV antibiotics with good clinical response. You will need to take the oral antibiotic, cephalexin (Keflex), 4 times a day for the next 6 days for a total of 1 week of antibiotic therapy for this infection. Your pain and swelling should continue to improve on oral antibiotics.  Please follow up with your primary care provider as previously scheduled to evaluate how your infection responds to antibiotic therapy.   For pain control you can safely take over the counter medications, including Tylenol and/or Ibuprofen. I would suggest ibuprofen (600 mg up to 3 times a  day) for pain control. This usually helps with the type of pain that results from skin infections.   Increase activity slowly   Complete by:  As directed       Signed: Rozann Lesches, MD 07/04/2017, 2:15 PM   Pager: [phone]

## 2017-07-04 NOTE — Progress Notes (Signed)
Pt for discharge to home.  Letter for work printed and sent home with pt.  DC instructions given and reviewed with pt.  Rx reviewed for Keflex for home.  No questions verbalized about home self care.

## 2017-07-04 NOTE — Discharge Instructions (Signed)

## 2017-07-04 NOTE — Progress Notes (Signed)
   Subjective: Patient seen laying comfortably in bed this AM in no acute distress. She states that the pain in her RLE has improved significantly since yesterday. She denies fevers/chills overnight. She is unsure if the color of her lower extremity has changed since receiving IV antibiotics and she is not sure if the area of erythema has changed at all since starting antibiotics.  Objective:  Vital signs in last 24 hours: Vitals:   07/03/17 2045 07/03/17 2200 07/03/17 2239 07/04/17 0448  BP: 116/84 109/65 123/74 (!) 122/56  Pulse: 97 (!) 107 (!) 107 86  Resp:   18 20  Temp:   98.9 F (37.2 C) 98.4 F (36.9 C)  TempSrc:   Oral Oral  SpO2: 95% 97% 99% 99%  Weight:   259 lb 11.2 oz (117.8 kg)   Height:   5\' 3"  (1.6 m)    Physical Exam  Constitutional: She appears well-developed and well-nourished. No distress.  HENT:  Mouth/Throat: Oropharynx is clear and moist.  Cardiovascular: Normal rate, regular rhythm and intact distal pulses. Exam reveals no friction rub.  No murmur heard. Respiratory: Effort normal. No respiratory distress. She has no wheezes.  No crackles  GI: Soft. She exhibits no distension. There is no tenderness. There is no rebound.  Musculoskeletal: She exhibits no edema (trace nonpitting edema of LLE compared to RLE from knee to ankle (corresponding to area of erythema)) or tenderness (with palpation of lower extremity).  Skin:  Erythema throughout RLE, from ankle to knee joint and extending partially up medial aspect of R thigh. Some swelling and warmth compared to yesterday, with patient reporting subjective improvement from yesterday's exam.   Assessment/Plan:  Active Problems:   Cellulitis  [name] is a 22 yo F with a past medical history of eczema, obesity, asthma who presents to the ED with complaints of R leg pain and swelling. On physical exam she was found to have non-purulent cellulitis. She was admitted to the internal medicine teaching service for  management. The specific problems addressed during admission are as follows:  Non-purulent RLE Cellulitis, complicated by history of atopic dermatitis: Patient reports interval improvement in her symptoms, including pain and swelling of RLE. Patient continues to report erythema in R lower extremity which has not significantly changed since hospitalization. Patient reports history of these type of skin infections that typically occur as a result of skin breakdown 2/2 exczema. Her clinical status is improving with IV antibiotics, as mild leukocytosis observed on admission (12.8) has resolved. Will continue to monitor for sings/symptoms of infection throughout day, but patient appears stable for transition to PO antibiotics. If patient remains afebrile throughout day, will be discharged with close PCP follow up (already scheduled for this upcoming week) and ambulatory referral to dermatology.  -Transition to PO Keflex q6 hours for 10 total days of antibiotic therapy -Continue to monitor clinically for improvement in LE cellulitis today, monitor fever curve -Pain control: Tylenol 650 mg q6hr prn    FEN/GI: -Regular diet  -No IVF, replace electrolytes as needed  VTE Prophylaxis: Lovenox daily Code Status: Full  Dispo: Anticipated discharge in approximately 0-1 day(s).   Rozann LeschesNedrud, Ashelyn Mccravy, MD 07/04/2017, 11:21 AM Pager: [phone]

## 2017-07-07 NOTE — Progress Notes (Deleted)
Patient ID: Nestor Lewandowskyyra Gradilla, female   DOB: [birth year], 22 y.o.   MRN: [id number] After being hospitalized    1.  The patient presented with acute onset R lower extremity non-purulent cellulitis which was improved with IV antibiotic therapy. Patient given a 6 day course of Keflex upon discharge (total of 7 days of antibiotic treatment). She already had follow scheduled within 1 week of discharge with PCP. Please assess for clinical resolution of RLE erythema and RLE swelling on this antibiotic regimen.   Patient also given ambulatory referral to dermatology at follow up given her reports of recurrent skin infections 2/2 poorly controlled atopic dermatitis. Please assess her ability to establish with dermatology for improved control of baseline eczema. She was given prescription for Eucerin cream on discharge to assist with eczema prior to follow up with dermatologist. Please assess how she is doing on this medication.   2.  Labs / imaging needed at time of follow-up: None  3.  Pending labs/ test needing follow-up: None    Hospital Course by problem list: Active Problems:   Cellulitis   [name] is a 22 yo F with a past medical history of eczema, obesity, asthma who presented to the ED with complaints of R leg pain and swelling and was found to have non-purulent cellulitis on physical exam. She was admitted to the internal medicine teaching service for management. The specific problems addressed during admission are as follows:  Non-purulent RLECellulitis,complicated by history of atopicdermatitis: Patient reported acute onset of her RLE swelling, erythema and pain upon presentation to ED. Her clinical status improved with IV antibiotics which first included a dose of vancomycin in the ED that was later narrowed to an IV cephalosporin. She had mild leukocytosis on admission (WBC = 12.8) that resolved with IV antibiotics. Patient remained afebrile throughout hospitalization and hemodynamically  stable. Her symptoms of RLE pain and swelling had improved overnight with IV antibiotic therapy. She was transitioned to oral Keflex to take q6 hours for 7 days. She was discharged to home with close PCP follow up, which patient stated was already scheduled within 1 week of discharge. At the time of PCP follow up if patient's infection has resolved antibiotics can be discontinued. At discharge, patient was given ambulatory referral to dermatology as she reported multiple, recurrent skin infections 2/2 skin breakdown with recurrent eczema flares.

## 2017-07-08 ENCOUNTER — Ambulatory Visit: Payer: Medicaid Other

## 2017-08-26 ENCOUNTER — Ambulatory Visit: Payer: BLUE CROSS/BLUE SHIELD | Attending: Internal Medicine | Admitting: Internal Medicine

## 2017-08-26 ENCOUNTER — Encounter: Payer: Self-pay | Admitting: Internal Medicine

## 2017-08-26 VITALS — BP 131/89 | HR 76 | Temp 98.3°F | Resp 16 | Wt 259.0 lb

## 2017-08-26 DIAGNOSIS — M542 Cervicalgia: Secondary | ICD-10-CM | POA: Diagnosis not present

## 2017-08-26 MED ORDER — METHOCARBAMOL 500 MG PO TABS: 500.0000 mg | ORAL_TABLET | Freq: Two times a day (BID) | ORAL | 0 refills | Status: DC | PRN

## 2017-08-26 MED ORDER — IBUPROFEN 600 MG PO TABS: 600.0000 mg | ORAL_TABLET | Freq: Three times a day (TID) | ORAL | 0 refills | Status: DC | PRN

## 2017-08-26 NOTE — Progress Notes (Signed)
Patient ID: Amber Arroyo [place], female    DOB: [birth year]  MRN: [id number]  CC: Neck Pain   Subjective: Amber Arroyo Cammar[place] is a 22 y.o. female who presents for uc Her concerns today include:   C/o NECK PAIN in the midline and at the base of the neck on both sides.  started last evening when she got off work.  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  Does some lifting.  He thinks it may have contributed.  Patient Active Problem List   Diagnosis Date Noted  . Cellulitis 07/03/2017  . Seasonal allergies 02/10/2016  . Eczema 01/07/2016  . Obesity 01/07/2016  . Abscess of neck 02/04/2013     Current Outpatient Medications on File Prior to Visit  Medication Sig Dispense Refill  . Skin Protectants, Misc. (EUCERIN) cream Apply topically as needed for dry skin. 454 g 3   No current facility-administered medications on file prior to visit.     No Known Allergies  Social History   Socioeconomic History  . Marital status: Single    Spouse name: Not on file  . Number of children: Not on file  . Years of education: Not on file  . Highest education level: Not on file  Social Needs  . Financial resource strain: Not on file  . Food insecurity - worry: Not on file  . Food insecurity - inability: Not on file  . Transportation needs - medical: Not on file  . Transportation needs - non-medical: Not on file  Occupational History  . Not on file  Tobacco Use  . Smoking status: Never Smoker  . Smokeless tobacco: Never Used  Substance and Sexual Activity  . Alcohol use: No  . Drug use: No  . Sexual activity: Yes    Birth control/protection: Condom    Comment: LMP was last week  Other Topics Concern  . Not on file  Social History Narrative   Lives at home with mother.  No pets.    Family History  Problem Relation Age of Onset  . Hypertension Mother   . Asthma Mother   . Eczema Mother   . Hypertension Father     Past Surgical History:   Procedure Laterality Date  . INCISION AND DRAINAGE ABSCESS Left 02/04/2013   Procedure: INCISION AND DRAINAGE POSTERIOR NECK ABSCESS;  Surgeon: Flo ShanksKarol Wolicki, MD;  [facility];  Service: ENT;  Laterality: Left;  . MOUTH SURGERY     Wisdom Teeth Extraction    ROS: Review of Systems Negative except as above PHYSICAL EXAM: BP 131/89   Pulse 76   Temp 98.3 F (36.8 C) (Oral)   Resp 16   Wt 259 lb (117.5 kg)   SpO2 97%   BMI 45.88 kg/m   Physical Exam  General appearance - alert, well appearing, young African-American female and in no distress Mental status - alert, oriented to person, place, and time, normal mood, behavior, speech, dress, motor activity, and thought processes Musculoskeletal -neck: Mild tenderness on palpation of the trapezius on both sides of the neck.  No tenderness on palpation of cervical vertebrae.  Mild discomfort with flexion and extension.  No discomfort with rotation.   ASSESSMENT AND PLAN: 1. Neck pain, acute -Likely more muscle strain.  Recommend ibuprofen PRN.  Robaxin as needed.  Warned that Robaxin can cause some drowsiness and not to take it when she has to drive or operate any machinery. - methocarbamol (ROBAXIN) 500  MG tablet; Take 1 tablet (500 mg total) by mouth 2 (two) times daily as needed for muscle spasms.  Dispense: 15 tablet; Refill: 0 - ibuprofen (ADVIL,MOTRIN) 600 MG tablet; Take 1 tablet (600 mg total) by mouth every 8 (eight) hours as needed.  Dispense: 30 tablet; Refill: 0   Patient was given the opportunity to ask questions.  Patient verbalized understanding of the plan and was able to repeat key elements of the plan.   No orders of the defined types were placed in this encounter.    Requested Prescriptions   Signed Prescriptions Disp Refills  . methocarbamol (ROBAXIN) 500 MG tablet 15 tablet 0    Sig: Take 1 tablet (500 mg total) by mouth 2 (two) times daily as needed for muscle spasms.  Marland Kitchen ibuprofen (ADVIL,MOTRIN) 600 MG  tablet 30 tablet 0    Sig: Take 1 tablet (600 mg total) by mouth every 8 (eight) hours as needed.    No Follow-up on file.  Jonah Blue, MD, FACP

## 2017-09-16 ENCOUNTER — Encounter: Payer: Self-pay | Admitting: Family Medicine

## 2017-09-16 ENCOUNTER — Ambulatory Visit: Payer: BLUE CROSS/BLUE SHIELD | Attending: Family Medicine | Admitting: Family Medicine

## 2017-09-16 VITALS — BP 119/79 | HR 87 | Temp 97.6°F | Ht 62.0 in | Wt 258.0 lb

## 2017-09-16 DIAGNOSIS — J302 Other seasonal allergic rhinitis: Secondary | ICD-10-CM | POA: Insufficient documentation

## 2017-09-16 DIAGNOSIS — L309 Dermatitis, unspecified: Secondary | ICD-10-CM | POA: Diagnosis not present

## 2017-09-16 MED ORDER — TRIAMCINOLONE ACETONIDE 0.1 % EX CREA: 1.0000 "application " | TOPICAL_CREAM | Freq: Two times a day (BID) | CUTANEOUS | 1 refills | Status: DC

## 2017-09-16 MED ORDER — ALBUTEROL SULFATE HFA 108 (90 BASE) MCG/ACT IN AERS: 2.0000 | INHALATION_SPRAY | Freq: Four times a day (QID) | RESPIRATORY_TRACT | 0 refills | Status: DC | PRN

## 2017-09-16 MED ORDER — CETIRIZINE HCL 10 MG PO TABS: 10.0000 mg | ORAL_TABLET | Freq: Every day | ORAL | 1 refills | Status: DC

## 2017-09-16 NOTE — Progress Notes (Signed)
Subjective:  Patient ID: Amber Arroyo, female    DOB: [birth year]  Age: 22 y.o. MRN: [id number]  CC: Eczema and Allergies   HPI Amber Arroyo is a 22 year old female with a history of eczema, seasonal allergies who presents today complaining of worsening of her eczema symptoms.  She previously used triamcinolone but received only a little tube which did not suffice and her eczema has worsened with breaking out in rash in the dorsum of both hands and the back of her feet bilaterally; rashes itchy. She endorses using dove products. She complains of seasonal allergy symptoms and wakes up coughing in the mornings with associated postnasal drip and sputum in the back of her throat.  In 06/22/17 she was hospitalized for right lower extremity cellulitis for which she was placed on Keflex and reports complete resolution of symptoms.  Past Medical History:  Diagnosis Date  . Allergy    eczema, seasonal allergies  . Eczema   . Obesity     Past Surgical History:  Procedure Laterality Date  . INCISION AND DRAINAGE ABSCESS Left 02/04/2013   Procedure: INCISION AND DRAINAGE POSTERIOR NECK ABSCESS;  Surgeon: Flo Shanks, MD;  Location: [facility] OR;  Service: ENT;  Laterality: Left;  . MOUTH SURGERY     Wisdom Teeth Extraction    No Known Allergies   Outpatient Medications Prior to Visit  Medication Sig Dispense Refill  . ibuprofen (ADVIL,MOTRIN) 600 MG tablet Take 1 tablet (600 mg total) by mouth every 8 (eight) hours as needed. (Patient not taking: Reported on 09/16/2017) 30 tablet 0  . methocarbamol (ROBAXIN) 500 MG tablet Take 1 tablet (500 mg total) by mouth 2 (two) times daily as needed for muscle spasms. (Patient not taking: Reported on 09/16/2017) 15 tablet 0  . Skin Protectants, Misc. (EUCERIN) cream Apply topically as needed for dry skin. (Patient not taking: Reported on 09/16/2017) 454 g 3   No facility-administered medications prior to visit.     ROS Review of Systems  Constitutional: Negative  for activity change, appetite change and fatigue.  HENT: Positive for postnasal drip. Negative for congestion, sinus pressure and sore throat.   Eyes: Negative for visual disturbance.  Respiratory: Negative for cough, chest tightness, shortness of breath and wheezing.   Cardiovascular: Negative for chest pain and palpitations.  Gastrointestinal: Negative for abdominal distention, abdominal pain and constipation.  Endocrine: Negative for polydipsia.  Genitourinary: Negative for dysuria and frequency.  Musculoskeletal: Negative for arthralgias and back pain.  Skin: Positive for rash.  Neurological: Negative for tremors, light-headedness and numbness.  Hematological: Does not bruise/bleed easily.  Psychiatric/Behavioral: Negative for agitation and behavioral problems.    Objective:  BP 119/79   Pulse 87   Temp 97.6 F (36.4 C) (Oral)   Ht 5\' 2"  (1.575 m)   Wt 258 lb (117 kg)   SpO2 98%   BMI 47.19 kg/m   BP/Weight 09/16/2017 08/26/2017 07/04/2017  Systolic BP 119 131 122  Diastolic BP 79 89 56  Wt. (Lbs) 258 259 -  BMI 47.19 45.88 -      Physical Exam  Constitutional: She is oriented to person, place, and time. She appears well-developed and well-nourished.  Cardiovascular: Normal rate, normal heart sounds and intact distal pulses.  No murmur heard. Pulmonary/Chest: Effort normal and breath sounds normal. She has no wheezes. She has no rales. She exhibits no tenderness.  Abdominal: Soft. Bowel sounds are normal. She exhibits no distension and no mass. There is no tenderness.  Musculoskeletal: Normal range  of motion.  Neurological: She is alert and oriented to person, place, and time.  Skin:  Lichenified skin on dorsum of both palms and posterior aspects of both feet  Psychiatric: She has a normal mood and affect.     Assessment & Plan:   1. Seasonal allergies - albuterol (PROVENTIL HFA;VENTOLIN HFA) 108 (90 Base) MCG/ACT inhaler; Inhale 2 puffs into the lungs every 6  (six) hours as needed for wheezing or shortness of breath.  Dispense: 1 Inhaler; Refill: 0 - cetirizine (ZYRTEC) 10 MG tablet; Take 1 tablet (10 mg total) by mouth daily.  Dispense: 30 tablet; Refill: 1  2. Eczema, unspecified type Controlled Discussed skin care-avoid scented products, use lukewarm rather than the hot water - triamcinolone cream (KENALOG) 0.1 %; Apply 1 application topically 2 (two) times daily.  Dispense: 80 g; Refill: 1 - Ambulatory referral to Dermatology   Meds ordered this encounter  Medications  . albuterol (PROVENTIL HFA;VENTOLIN HFA) 108 (90 Base) MCG/ACT inhaler    Sig: Inhale 2 puffs into the lungs every 6 (six) hours as needed for wheezing or shortness of breath.    Dispense:  1 Inhaler    Refill:  0  . cetirizine (ZYRTEC) 10 MG tablet    Sig: Take 1 tablet (10 mg total) by mouth daily.    Dispense:  30 tablet    Refill:  1  . triamcinolone cream (KENALOG) 0.1 %    Sig: Apply 1 application topically 2 (two) times daily.    Dispense:  80 g    Refill:  1    Follow-up: Return in about 3 months (around 12/17/2017) for Follow-up of eczema and seasonal allergies.   Hoy RegisterEnobong Ison Wichmann MD

## 2017-09-16 NOTE — Patient Instructions (Signed)

## 2017-11-19 ENCOUNTER — Other Ambulatory Visit: Payer: Self-pay | Admitting: Family Medicine

## 2017-11-19 DIAGNOSIS — L309 Dermatitis, unspecified: Secondary | ICD-10-CM

## 2017-11-25 ENCOUNTER — Encounter (HOSPITAL_COMMUNITY): Payer: Self-pay | Admitting: Emergency Medicine

## 2017-11-25 ENCOUNTER — Ambulatory Visit (HOSPITAL_COMMUNITY)
Admission: EM | Admit: 2017-11-25 | Discharge: 2017-11-25 | Disposition: A | Discharge status: Home / Self Care | Payer: BLUE CROSS/BLUE SHIELD | Attending: Family Medicine | Admitting: Family Medicine

## 2017-11-25 DIAGNOSIS — L309 Dermatitis, unspecified: Secondary | ICD-10-CM | POA: Diagnosis not present

## 2017-11-25 DIAGNOSIS — L568 Other specified acute skin changes due to ultraviolet radiation: Secondary | ICD-10-CM | POA: Diagnosis not present

## 2017-11-25 MED ORDER — PREDNISONE 20 MG PO TABS: 20.0000 mg | ORAL_TABLET | Freq: Every day | ORAL | 0 refills | Status: AC

## 2017-11-25 MED ORDER — EUCERIN EX CREA: TOPICAL_CREAM | CUTANEOUS | 3 refills | Status: DC | PRN

## 2017-11-25 MED ORDER — TRIAMCINOLONE ACETONIDE 0.1 % EX CREA: 1.0000 "application " | TOPICAL_CREAM | Freq: Two times a day (BID) | CUTANEOUS | 0 refills | Status: DC

## 2017-11-25 NOTE — Discharge Instructions (Addendum)
Please use Aquaphor to eyelids and face to provide as an emollient, may want to apply at night. Complete 5 days of prednisone. Use of Kenalog cream to large affected areas, but limited use on the face. On the face use thin small amounts.  Try to keep covered from the sun, use of hats, umbrella etc.

## 2017-11-25 NOTE — ED Provider Notes (Signed)
[facility]    CSN: [id number] Arrival date & time: 11/25/17  1130     History   Chief Complaint Chief Complaint  Patient presents with  . Rash    HPI Amber Arroyo is a 22 y.o. female.   Amber Arroyo presents with complaints of rash to face after being in the sun approximately 2-3 days ago. She has had some skin peeling as well as redness from this. Redness has improved. Was burning. Now itchy. History of eczema, has not been using any specific treatment for this. Tried cocoa butter which did not help. Has eczema to hands as well. History of allergies as well. No fevers, no URI symptoms.    ROS per HPI.      Past Medical History:  Diagnosis Date  . Allergy    eczema, seasonal allergies  . Eczema   . Obesity     Patient Active Problem List   Diagnosis Date Noted  . Cellulitis 07/03/2017  . Seasonal allergies 02/10/2016  . Eczema 01/07/2016  . Obesity 01/07/2016  . Abscess of neck 02/04/2013    Past Surgical History:  Procedure Laterality Date  . INCISION AND DRAINAGE ABSCESS Left 02/04/2013   Procedure: INCISION AND DRAINAGE POSTERIOR NECK ABSCESS;  Surgeon: Flo Shanks, MD;  Location: [facility] OR;  Service: ENT;  Laterality: Left;  . MOUTH SURGERY     Wisdom Teeth Extraction    OB History   None      Home Medications    Prior to Admission medications   Medication Sig Start Date End Date Taking? Authorizing Provider  albuterol (PROVENTIL HFA;VENTOLIN HFA) 108 (90 Base) MCG/ACT inhaler Inhale 2 puffs into the lungs every 6 (six) hours as needed for wheezing or shortness of breath. 09/16/17   Hoy Register, MD  cetirizine (ZYRTEC) 10 MG tablet Take 1 tablet (10 mg total) by mouth daily. 09/16/17   Hoy Register, MD  ibuprofen (ADVIL,MOTRIN) 600 MG tablet Take 1 tablet (600 mg total) by mouth every 8 (eight) hours as needed. Patient not taking: Reported on 09/16/2017 08/26/17   Marcine Matar, MD  methocarbamol (ROBAXIN) 500 MG tablet Take 1 tablet (500 mg  total) by mouth 2 (two) times daily as needed for muscle spasms. Patient not taking: Reported on 09/16/2017 08/26/17   Marcine Matar, MD  predniSONE (DELTASONE) 20 MG tablet Take 1 tablet (20 mg total) by mouth daily with breakfast for 5 days. 11/25/17 11/30/17  Georgetta Haber, NP  Skin Protectants, Misc. (EUCERIN) cream Apply topically as needed for dry skin. 11/25/17   Linus Mako B, NP  triamcinolone cream (KENALOG) 0.1 % Apply 1 application topically 2 (two) times daily. 11/25/17   Georgetta Haber, NP    Family History Family History  Problem Relation Age of Onset  . Hypertension Mother   . Asthma Mother   . Eczema Mother   . Hypertension Father     Social History Social History   Tobacco Use  . Smoking status: Never Smoker  . Smokeless tobacco: Never Used  Substance Use Topics  . Alcohol use: No  . Drug use: No     Allergies   Patient has no known allergies.   Review of Systems Review of Systems   Physical Exam Triage Vital Signs ED Triage Vitals [11/25/17 1216]  Enc Vitals Group     BP 118/68     Pulse Rate 81     Resp 16     Temp 97.8 F (36.6 C)  Temp src      SpO2 97 %     Weight      Height      Head Circumference      Peak Flow      Pain Score      Pain Loc      Pain Edu?      Excl. in GC?    No data found.  Updated Vital Signs BP 118/68   Pulse 81   Temp 97.8 F (36.6 C)   Resp 16   SpO2 97%   Visual Acuity Right Eye Distance:   Left Eye Distance:   Bilateral Distance:    Right Eye Near:   Left Eye Near:    Bilateral Near:     Physical Exam  Constitutional: She is oriented to person, place, and time. She appears well-developed and well-nourished. No distress.  Cardiovascular: Normal rate, regular rhythm and normal heart sounds.  Pulmonary/Chest: Effort normal and breath sounds normal.  Neurological: She is alert and oriented to person, place, and time.  Skin: Skin is warm and dry. Rash noted.  Extensive dermatitis noted  to hands, bilateral neck, forehead, eyelids, upper lip and right cheek. It appears that likely had some sun burn with peeling of some of the skin to right face which was sun exposed more; scaly hyperpigmented lesions     UC Treatments / Results  Labs (all labs ordered are listed, but only abnormal results are displayed) Labs Reviewed - No data to display  EKG None  Radiology No results found.  Procedures Procedures (including critical care time)  Medications Ordered in UC Medications - No data to display  Initial Impression / Assessment and Plan / UC Course  I have reviewed the triage vital signs and the nursing notes.  Pertinent labs & imaging results that were available during my care of the patient were reviewed by me and considered in my medical decision making (see chart for details).     Sun exposure as well as eczema flair. 5 days of oral prednisone as well as topical cream and kenalog provided. Encouraged use of hat or umbrella while in the sun as well as good skin hydration. Patient verbalized understanding and agreeable to plan.    Final Clinical Impressions(s) / UC Diagnoses   Final diagnoses:  Eczema, unspecified type     Discharge Instructions     Please use Aquaphor to eyelids and face to provide as an emollient, may want to apply at night. Complete 5 days of prednisone. Use of Kenalog cream to large affected areas, but limited use on the face. On the face use thin small amounts.  Try to keep covered from the sun, use of hats, umbrella etc.    ED Prescriptions    Medication Sig Dispense Auth. Provider   predniSONE (DELTASONE) 20 MG tablet Take 1 tablet (20 mg total) by mouth daily with breakfast for 5 days. 5 tablet Linus MakoBurky, Vega Stare B, NP   triamcinolone cream (KENALOG) 0.1 % Apply 1 application topically 2 (two) times daily. 30 g Georgetta HaberBurky, Britten Parady B, NP   Skin Protectants, Misc. (EUCERIN) cream Apply topically as needed for dry skin. 454 g Georgetta HaberBurky, Hae Ahlers B,  NP     Controlled Substance Prescriptions [place] Controlled Substance Registry consulted? Not Applicable   Georgetta HaberBurky, Shanice Poznanski B, NP 11/25/17 1314

## 2017-11-25 NOTE — ED Triage Notes (Signed)
Pt c/o rash all over her face and hands and arms, hx of eczema.

## 2017-12-17 ENCOUNTER — Ambulatory Visit: Payer: BLUE CROSS/BLUE SHIELD | Admitting: Family Medicine

## 2018-01-06 ENCOUNTER — Encounter (HOSPITAL_COMMUNITY): Payer: Self-pay | Admitting: Emergency Medicine

## 2018-01-06 ENCOUNTER — Ambulatory Visit (HOSPITAL_COMMUNITY)
Admission: EM | Admit: 2018-01-06 | Discharge: 2018-01-06 | Disposition: A | Discharge status: Home / Self Care | Payer: BLUE CROSS/BLUE SHIELD | Attending: Family Medicine | Admitting: Family Medicine

## 2018-01-06 ENCOUNTER — Other Ambulatory Visit: Payer: Self-pay

## 2018-01-06 DIAGNOSIS — Z8249 Family history of ischemic heart disease and other diseases of the circulatory system: Secondary | ICD-10-CM | POA: Insufficient documentation

## 2018-01-06 DIAGNOSIS — J45909 Unspecified asthma, uncomplicated: Secondary | ICD-10-CM | POA: Diagnosis not present

## 2018-01-06 DIAGNOSIS — R109 Unspecified abdominal pain: Secondary | ICD-10-CM

## 2018-01-06 DIAGNOSIS — Z791 Long term (current) use of non-steroidal anti-inflammatories (NSAID): Secondary | ICD-10-CM | POA: Insufficient documentation

## 2018-01-06 DIAGNOSIS — N938 Other specified abnormal uterine and vaginal bleeding: Secondary | ICD-10-CM | POA: Diagnosis not present

## 2018-01-06 DIAGNOSIS — Z9889 Other specified postprocedural states: Secondary | ICD-10-CM | POA: Insufficient documentation

## 2018-01-06 DIAGNOSIS — Z840 Family history of diseases of the skin and subcutaneous tissue: Secondary | ICD-10-CM | POA: Insufficient documentation

## 2018-01-06 DIAGNOSIS — Z3202 Encounter for pregnancy test, result negative: Secondary | ICD-10-CM | POA: Diagnosis not present

## 2018-01-06 DIAGNOSIS — Z79899 Other long term (current) drug therapy: Secondary | ICD-10-CM | POA: Diagnosis not present

## 2018-01-06 DIAGNOSIS — N939 Abnormal uterine and vaginal bleeding, unspecified: Secondary | ICD-10-CM | POA: Diagnosis present

## 2018-01-06 DIAGNOSIS — Z825 Family history of asthma and other chronic lower respiratory diseases: Secondary | ICD-10-CM | POA: Insufficient documentation

## 2018-01-06 DIAGNOSIS — Z7952 Long term (current) use of systemic steroids: Secondary | ICD-10-CM | POA: Insufficient documentation

## 2018-01-06 HISTORY — DX: Unspecified asthma, uncomplicated: J45.909

## 2018-01-06 LAB — POCT URINALYSIS DIP (DEVICE)
Bilirubin Urine: NEGATIVE
GLUCOSE, UA: NEGATIVE mg/dL
KETONES UR: NEGATIVE mg/dL
LEUKOCYTES UA: NEGATIVE
Nitrite: NEGATIVE
Protein, ur: NEGATIVE mg/dL
Specific Gravity, Urine: >=1.03 (ref 1.005–1.030)
Urobilinogen, UA: 0.2 mg/dL (ref 0.0–1.0)
pH: 5.5 (ref 5.0–8.0)

## 2018-01-06 LAB — POCT PREGNANCY, URINE: Preg Test, Ur: NEGATIVE

## 2018-01-06 MED ORDER — NAPROXEN 500 MG PO TABS: 500.0000 mg | ORAL_TABLET | Freq: Two times a day (BID) | ORAL | 0 refills | Status: DC

## 2018-01-06 NOTE — ED Triage Notes (Signed)
Reports bleeding for a month.  Patient is not on any birth control.

## 2018-01-06 NOTE — ED Provider Notes (Signed)
[facility]    CSN: [id number] Arrival date & time: 01/06/18  [id number]     History   Chief Complaint Chief Complaint  Patient presents with  . Vaginal Bleeding    HPI Amber Arroyo is a 22 y.o. female.   HPI  Patient is here for abnormal vaginal bleeding.  She states she normally has regular menstrual periods.  She states that her last menstrual period was 12/19/2017.  She has been spotting ever since.  No heavy bleeding.  Mild abdominal discomfort, cramping.  No nausea or vomiting.  No vaginal discharge or infection.  No fever or chills.  She did have unprotected sexual relations last weekend.  She desires STD testing.  She is not using birth control.  Condoms and safe sex are recommended for patient.  No urinary complaints, dysuria, frequency.  Past Medical History:  Diagnosis Date  . Allergy    eczema, seasonal allergies  . Asthma   . Eczema   . Obesity     Patient Active Problem List   Diagnosis Date Noted  . Cellulitis 07/03/2017  . Seasonal allergies 02/10/2016  . Eczema 01/07/2016  . Obesity 01/07/2016  . Abscess of neck 02/04/2013    Past Surgical History:  Procedure Laterality Date  . INCISION AND DRAINAGE ABSCESS Left 02/04/2013   Procedure: INCISION AND DRAINAGE POSTERIOR NECK ABSCESS;  Surgeon: Flo ShanksKarol Wolicki, MD;  [facility];  Service: ENT;  Laterality: Left;  . MOUTH SURGERY     Wisdom Teeth Extraction    OB History   None      Home Medications    Prior to Admission medications   Medication Sig Start Date End Date Taking? Authorizing Provider  albuterol (PROVENTIL HFA;VENTOLIN HFA) 108 (90 Base) MCG/ACT inhaler Inhale 2 puffs into the lungs every 6 (six) hours as needed for wheezing or shortness of breath. 09/16/17   Hoy RegisterNewlin, Enobong, MD  naproxen (NAPROSYN) 500 MG tablet Take 1 tablet (500 mg total) by mouth 2 (two) times daily. 01/06/18   Eustace MooreNelson, Taegen Delker Sue, MD  Skin Protectants, Misc. (EUCERIN) cream Apply topically as needed for dry  skin. 11/25/17   Linus MakoBurky, Natalie B, NP  triamcinolone cream (KENALOG) 0.1 % Apply 1 application topically 2 (two) times daily. 11/25/17   Georgetta HaberBurky, Natalie B, NP    Family History Family History  Problem Relation Age of Onset  . Hypertension Mother   . Asthma Mother   . Eczema Mother   . Hypertension Father     Social History Social History   Tobacco Use  . Smoking status: Never Smoker  . Smokeless tobacco: Never Used  Substance Use Topics  . Alcohol use: No  . Drug use: No     Allergies   Patient has no known allergies.   Review of Systems Review of Systems  Constitutional: Negative for chills and fever.  HENT: Negative for ear pain and sore throat.   Eyes: Negative for pain and visual disturbance.  Respiratory: Negative for cough and shortness of breath.   Cardiovascular: Negative for chest pain and palpitations.  Gastrointestinal: Negative for abdominal pain and vomiting.  Genitourinary: Positive for vaginal bleeding. Negative for dysuria and hematuria.  Musculoskeletal: Negative for arthralgias and back pain.  Skin: Negative for color change and rash.  Neurological: Negative for seizures and syncope.  All other systems reviewed and are negative.    Physical Exam Triage Vital Signs ED Triage Vitals  Enc Vitals Group     BP 01/06/18 0850 127/80  Pulse Rate 01/06/18 0850 75     Resp 01/06/18 0850 (!) 22     Temp 01/06/18 0850 98.1 F (36.7 C)     Temp Source 01/06/18 0850 Oral     SpO2 01/06/18 0850 100 %     Weight --      Height --      Head Circumference --      Peak Flow --      Pain Score 01/06/18 0847 7     Pain Loc --      Pain Edu? --      Excl. in GC? --    No data found.  Updated Vital Signs BP 127/80 (BP Location: Left Arm)   Pulse 75   Temp 98.1 F (36.7 C) (Oral)   Resp (!) 22   LMP 12/16/2017   SpO2 100%   Visual Acuity Right Eye Distance:   Left Eye Distance:   Bilateral Distance:    Right Eye Near:   Left Eye Near:      Bilateral Near:     Physical Exam  Constitutional: She appears well-developed and well-nourished. No distress.  HENT:  Head: Normocephalic and atraumatic.  Mouth/Throat: Oropharynx is clear and moist.  Eyes: Pupils are equal, round, and reactive to light. Conjunctivae are normal.  Neck: Normal range of motion.  Cardiovascular: Normal rate, regular rhythm and normal heart sounds.  Pulmonary/Chest: Effort normal and breath sounds normal. No respiratory distress. She has no wheezes.  Abdominal: Soft. Bowel sounds are normal. She exhibits no distension. There is no tenderness.  Musculoskeletal: Normal range of motion. She exhibits no edema.  Neurological: She is alert.  Skin: Skin is warm and dry.  Psychiatric: She has a normal mood and affect. Her behavior is normal.     UC Treatments / Results  Labs (all labs ordered are listed, but only abnormal results are displayed) Labs Reviewed  POCT URINALYSIS DIP (DEVICE) - Abnormal; Notable for the following components:      Result Value   Hgb urine dipstick LARGE (*)    All other components within normal limits  POCT PREGNANCY, URINE  URINE CYTOLOGY ANCILLARY ONLY    EKG None  Radiology No results found.  Procedures Procedures (including critical care time)  Medications Ordered in UC Medications - No data to display  Initial Impression / Assessment and Plan / UC Course  I have reviewed the triage vital signs and the nursing notes.  Pertinent labs & imaging results that were available during my care of the patient were reviewed by me and considered in my medical decision making (see chart for details).      Final Clinical Impressions(s) / UC Diagnoses   Final diagnoses:  Dysfunctional uterine bleeding     Discharge Instructions     Take the Naprosyn twice a day with food This should reduce the discomfort in the bleeding Follow-up with your primary care doctor We did lab testing during this visit.  If there are  any abnormal findings that require change in medicine or indicate a positive result, you will be notified.  If all of your tests are normal, you will not be called.      ED Prescriptions    Medication Sig Dispense Auth. Provider   naproxen (NAPROSYN) 500 MG tablet Take 1 tablet (500 mg total) by mouth 2 (two) times daily. 30 tablet Eustace Moore, MD     Controlled Substance Prescriptions [place] Controlled Substance Registry consulted? Not Applicable  Eustace Moore, MD 01/06/18 [phone]

## 2018-01-06 NOTE — Discharge Instructions (Addendum)
Take the Naprosyn twice a day with food This should reduce the discomfort in the bleeding Follow-up with your primary care doctor We did lab testing during this visit.  If there are any abnormal findings that require change in medicine or indicate a positive result, you will be notified.  If all of your tests are normal, you will not be called.

## 2018-01-07 LAB — URINE CYTOLOGY ANCILLARY ONLY
Chlamydia: NEGATIVE
NEISSERIA GONORRHEA: NEGATIVE
TRICH (WINDOWPATH): NEGATIVE

## 2018-01-24 ENCOUNTER — Ambulatory Visit: Payer: BLUE CROSS/BLUE SHIELD | Admitting: Family Medicine

## 2018-02-25 ENCOUNTER — Other Ambulatory Visit: Payer: Self-pay | Admitting: Family Medicine

## 2018-02-25 DIAGNOSIS — L309 Dermatitis, unspecified: Secondary | ICD-10-CM

## 2018-06-17 ENCOUNTER — Encounter: Payer: Self-pay | Admitting: Podiatry

## 2018-06-17 ENCOUNTER — Ambulatory Visit (INDEPENDENT_AMBULATORY_CARE_PROVIDER_SITE_OTHER): Payer: BLUE CROSS/BLUE SHIELD | Admitting: Podiatry

## 2018-06-17 VITALS — BP 109/91

## 2018-06-17 DIAGNOSIS — Q828 Other specified congenital malformations of skin: Secondary | ICD-10-CM | POA: Diagnosis not present

## 2018-06-17 NOTE — Patient Instructions (Signed)

## 2018-07-16 NOTE — Progress Notes (Signed)
Subjective: Amber Arroyo presents today with chief complaint of foot pain bilaterally for the past 6 months.  She describes pain as stabbing in nature.  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   Patient works 2 jobs at [employer] and at [employer] and she is on her feet many hours/day.  Past Medical History:  Diagnosis Date  . Allergy    eczema, seasonal allergies  . Asthma   . Eczema   . Obesity      Patient Active Problem List   Diagnosis Date Noted  . Cellulitis 07/03/2017  . Seasonal allergies 02/10/2016  . Eczema 01/07/2016  . Obesity 01/07/2016  . Benign essential hypertension 07/17/2013  . Abscess of neck 02/04/2013     Past Surgical History:  Procedure Laterality Date  . INCISION AND DRAINAGE ABSCESS Left 02/04/2013   Procedure: INCISION AND DRAINAGE POSTERIOR NECK ABSCESS;  Surgeon: Flo Shanks, MD;  Location: [facility] OR;  Service: ENT;  Laterality: Left;  . MOUTH SURGERY     Wisdom Teeth Extraction      Current Outpatient Medications:  .  albuterol (PROVENTIL HFA;VENTOLIN HFA) 108 (90 Base) MCG/ACT inhaler, Inhale 2 puffs into the lungs every 6 (six) hours as needed for wheezing or shortness of breath., Disp: 1 Inhaler, Rfl: 0 .  Dupilumab (DUPIXENT) 200 MG/1. SOSY, Inject into the skin., Disp: , Rfl:  .  naproxen (NAPROSYN) 500 MG tablet, Take 1 tablet (500 mg total) by mouth 2 (two) times daily., Disp: 30 tablet, Rfl: 0 .  Skin Protectants, Misc. (EUCERIN) cream, Apply topically as needed for dry skin., Disp: 454 g, Rfl: 3 .  triamcinolone cream (KENALOG) 0.1 %, APPLY EXTERNALLY TO THE AFFECTED AREA TWICE DAILY, Disp: 80 g, Rfl: 0   No Known Allergies   Social History   Occupational History  . Not on file  Tobacco Use  . Smoking status: Never Smoker  . Smokeless tobacco: Never Used  Substance and Sexual Activity  . Alcohol use: No  . Drug use: No  . Sexual activity: Yes    Birth control/protection: Condom   Comment: LMP was last week     Family History  Problem Relation Age of Onset  . Hypertension Mother   . Asthma Mother   . Eczema Mother   . Hypertension Father       There is no immunization history on file for this patient.   Review of systems: Positive Findings in bold print.  Constitutional:  chills, fatigue, fever, sweats, weight change Communication: Nurse, learning disability, sign Presenter, broadcasting, hand writing, iPad/Android device Head: headaches, head injury Eyes: changes in vision, eye pain, glaucoma, cataracts, macular degeneration, diplopia, glare,  light sensitivity, eyeglasses or contacts, blindness Ears nose mouth throat: Hard of hearing, ringing in ears, deaf, sign language,  vertigo,   nosebleeds,  rhinitis,  cold sores, snoring, swollen glands Cardiovascular: HTN, edema, arrhythmia, pacemaker in place, defibrillator in place,  chest pain/tightness, chronic anticoagulation, blood clot, heart failure Peripheral Vascular: leg cramps, varicose veins, blood clots, lymphedema Respiratory:  difficulty breathing, denies congestion, SOB, wheezing, cough, emphysema Gastrointestinal: change in appetite or weight, abdominal pain, constipation, diarrhea, nausea, vomiting, vomiting blood, change in bowel habits, abdominal pain, jaundice, rectal bleeding, hemorrhoids, Genitourinary:  nocturia,  pain on urination,  blood in urine, Foley catheter, urinary urgency Musculoskeletal: uses mobility aid,  cramping, stiff joints, painful joints, decreased joint motion, fractures, OA, gout Skin: +changes in toenails, color change, dryness, itching, mole changes,  rash  Neurological:  headaches, numbness in feet, paresthesias in feet, burning in feet, fainting,  seizures, change in speech. denies headaches, memory problems/poor historian, cerebral palsy, weakness, paralysis Endocrine: diabetes, hypothyroidism, hyperthyroidism,  goiter, dry mouth, flushing, heat intolerance,  cold intolerance,  excessive  thirst, denies polyuria,  nocturia Hematological:  easy bleeding, excessive bleeding, easy bruising, enlarged lymph nodes, on long term blood thinner, history of past transusions Allergy/immunological:  hives, eczema, frequent infections, multiple drug allergies, seasonal allergies, transplant recipient Psychiatric:  anxiety, depression, mood disorder, suicidal ideations, hallucinations   Objective: Vitals:   06/17/18 0945  BP: (!) 109/91   Vascular Examination: Capillary refill time <3 seconds x 10 digits Dorsalis pedis and posterior tibial pulses present b/l No digital hair x 10 digits Skin temperature warm to warm b/l  Dermatological Examination: Skin with normal turgor, texture and tone  Toenails adequate length and well-maintained bilaterally  She is noted to have porokeratotic lesions on the plantar lateral aspect of both of her feet which are tender to palpation.  There is no edema, no erythema, no drainage noted bilaterally.  Musculoskeletal: Muscle strength 5/5 to all LE muscle groups  Neurological: Sensation intact with 10 gram monofilament. Vibratory sensation intact.  Assessment: Painful porokeratotic lesions bilaterally  Plan: 1. Discussed diagnosis and treatment plan with patient.  Patient agreed to have lesions pared on today. 2. Porokeratotic lesions pared and enucleated with patient relating comfort posttreatment today.  We did discuss her changing shoes about every 6 months due to the amount of time she is on her feet working 2 jobs. 3. Patient to continue soft, supportive shoe gear 4. Patient to report any pedal injuries to medical professional immediately. 5. Follow up 3 months. Patient/POA to call should there be a concern in the interim.

## 2018-07-22 ENCOUNTER — Other Ambulatory Visit: Payer: Self-pay | Admitting: Family Medicine

## 2018-07-22 DIAGNOSIS — J302 Other seasonal allergic rhinitis: Secondary | ICD-10-CM

## 2018-09-16 ENCOUNTER — Ambulatory Visit: Payer: BLUE CROSS/BLUE SHIELD | Admitting: Podiatry

## 2018-11-05 ENCOUNTER — Other Ambulatory Visit: Payer: Self-pay | Admitting: Family Medicine

## 2018-11-05 DIAGNOSIS — L309 Dermatitis, unspecified: Secondary | ICD-10-CM

## 2018-11-06 ENCOUNTER — Other Ambulatory Visit: Payer: Self-pay | Admitting: Family Medicine

## 2018-11-06 DIAGNOSIS — L309 Dermatitis, unspecified: Secondary | ICD-10-CM

## 2018-11-23 ENCOUNTER — Ambulatory Visit
Admission: EM | Admit: 2018-11-23 | Discharge: 2018-11-23 | Disposition: A | Discharge status: Home / Self Care | Payer: BC Managed Care – PPO | Attending: Physician Assistant | Admitting: Physician Assistant

## 2018-11-23 DIAGNOSIS — N939 Abnormal uterine and vaginal bleeding, unspecified: Secondary | ICD-10-CM

## 2018-11-23 LAB — POCT URINALYSIS DIP (MANUAL ENTRY)
Bilirubin, UA: NEGATIVE
Glucose, UA: NEGATIVE mg/dL
Ketones, POC UA: NEGATIVE mg/dL
Nitrite, UA: NEGATIVE
Spec Grav, UA: 1.025 (ref 1.010–1.025)
Urobilinogen, UA: 0.2 E.U./dL
pH, UA: 6.5 (ref 5.0–8.0)

## 2018-11-23 LAB — POCT URINE PREGNANCY: Preg Test, Ur: NEGATIVE

## 2018-11-23 NOTE — ED Provider Notes (Signed)
[name]    CSN: [id number] Arrival date & time: 11/23/18  1528     History   Chief Complaint Chief Complaint  Patient presents with  . Vaginal Bleeding    HPI Amber Arroyo is a 23 y.o. female.   23 year old female comes in for abnormal uterine bleeding.  Patient's normal cycles are usually 5 days long, using 2-3 long pads per day, and having 1 cycle every month.  States in February, she had 2 days of her cycle.  Had no cycle March or April.  She started spotting May 24 to May 31, not requiring pads, and only using panty liners.  She restarted on spotting today and came in for evaluation.  She has no history of irregular cycles.  She has urinary frequency without dysuria.  Denies nausea, vomiting.  Had intermittent bloating without obvious abdominal pain.  Denies vaginal discharge, itching.  She is sexually active with one female partner, no condom use.     Past Medical History:  Diagnosis Date  . Allergy    eczema, seasonal allergies  . Asthma   . Eczema   . Obesity     Patient Active Problem List   Diagnosis Date Noted  . Cellulitis 07/03/2017  . Seasonal allergies 02/10/2016  . Eczema 01/07/2016  . Obesity 01/07/2016  . Benign essential hypertension 07/17/2013  . Abscess of neck 02/04/2013    Past Surgical History:  Procedure Laterality Date  . INCISION AND DRAINAGE ABSCESS Left 02/04/2013   Procedure: INCISION AND DRAINAGE POSTERIOR NECK ABSCESS;  Surgeon: Flo Shanks, MD;  Location: [facility] OR;  Service: ENT;  Laterality: Left;  . MOUTH SURGERY     Wisdom Teeth Extraction    OB History   No obstetric history on file.      Home Medications    Prior to Admission medications   Medication Sig Start Date End Date Taking? Authorizing Provider  Dupilumab (DUPIXENT) 200 MG/1. SOSY Inject into the skin.    [provider]  naproxen (NAPROSYN) 500 MG tablet Take 1 tablet (500 mg total) by mouth 2 (two) times daily. 01/06/18   Eustace Moore, MD  Skin Protectants, Misc. (EUCERIN) cream Apply topically as needed for dry skin. 11/25/17   Linus Mako B, NP  triamcinolone cream (KENALOG) 0.1 % APPLY EXTERNALLY TO THE AFFECTED AREA TWICE DAILY 02/25/18   Hoy Register, MD  VENTOLIN HFA 108 (90 Base) MCG/ACT inhaler Inhale 2 puffs into the lungs every 6 (six) hours as needed for wheezing or shortness of breath. MUST MAKE APPT FOR FURTHER REFILLS 07/22/18   Hoy Register, MD    Family History Family History  Problem Relation Age of Onset  . Hypertension Mother   . Asthma Mother   . Eczema Mother   . Hypertension Father     Social History Social History   Tobacco Use  . Smoking status: Never Smoker  . Smokeless tobacco: Never Used  Substance Use Topics  . Alcohol use: No  . Drug use: No     Allergies   Patient has no known allergies.   Review of Systems Review of Systems  Reason unable to perform ROS: See HPI as above.     Physical Exam Triage Vital Signs ED Triage Vitals [11/23/18 1537]  Enc Vitals Group     BP (!) 153/87     Pulse Rate 89     Resp 18     Temp 98 F (36.7 C)     Temp  Source Oral     SpO2 96 %     Weight      Height      Head Circumference      Peak Flow      Pain Score 0     Pain Loc      Pain Edu?      Excl. in GC?    No data found.  Updated Vital Signs BP (!) 153/87 (BP Location: Left Arm)   Pulse 89   Temp 98 F (36.7 C) (Oral)   Resp 18   SpO2 96%   Physical Exam Constitutional:      General: She is not in acute distress.    Appearance: She is well-developed. She is not ill-appearing, toxic-appearing or diaphoretic.  HENT:     Head: Normocephalic and atraumatic.  Eyes:     Conjunctiva/sclera: Conjunctivae normal.     Pupils: Pupils are equal, round, and reactive to light.  Cardiovascular:     Rate and Rhythm: Normal rate and regular rhythm.     Heart sounds: Normal heart sounds. No murmur. No friction rub. No gallop.   Pulmonary:     Effort: Pulmonary  effort is normal.     Breath sounds: Normal breath sounds. No wheezing or rales.  Abdominal:     General: Bowel sounds are normal.     Palpations: Abdomen is soft.     Tenderness: There is no abdominal tenderness. There is no right CVA tenderness, left CVA tenderness, guarding or rebound.  Skin:    General: Skin is warm and dry.  Neurological:     Mental Status: She is alert and oriented to person, place, and time.  Psychiatric:        Behavior: Behavior normal.        Judgment: Judgment normal.      UC Treatments / Results  Labs (all labs ordered are listed, but only abnormal results are displayed) Labs Reviewed  POCT URINALYSIS DIP (MANUAL ENTRY) - Abnormal; Notable for the following components:      Result Value   Blood, UA large (*)    Protein Ur, POC trace (*)    Leukocytes, UA Large (3+) (*)    All other components within normal limits  URINE CULTURE  POCT URINE PREGNANCY  CERVICOVAGINAL ANCILLARY ONLY    EKG None  Radiology No results found.  Procedures Procedures (including critical care time)  Medications Ordered in UC Medications - No data to display  Initial Impression / Assessment and Plan / UC Course  I have reviewed the triage vital signs and the nursing notes.  Pertinent labs & imaging results that were available during my care of the patient were reviewed by me and considered in my medical decision making (see chart for details).    Urine pregnancy negative.  Does have large leuks, will send for urine culture.  Cytology sent.  Given patient without significant vaginal bleeding, will defer Megace at this time.  Will have patient follow-up with GYN for further evaluation of abnormal uterine bleeding.  Return precautions given.  Patient expresses understanding and agrees to plan.  Final Clinical Impressions(s) / UC Diagnoses   Final diagnoses:  Abnormal uterine bleeding (AUB)   ED Prescriptions    None        Belinda FisherYu, Hyman Crossan V, PA-C 11/23/18 1731

## 2018-11-23 NOTE — Discharge Instructions (Addendum)
Pregnancy test negative. Please follow up with GYN for further evaluation of vaginal bleeding. If experiencing significant increase in vaginal bleeding, dizziness, passing out, go to the ED for further evaluation needed.   Come back tomorrow as discussed for more urine for testing.

## 2018-11-23 NOTE — ED Triage Notes (Signed)
Pt states went 2 months with no menstral cycle, started on 5/24 and stopped on Friday then came back today. States the bleeding has been very light the whole time

## 2018-11-24 NOTE — ED Notes (Signed)
Pt was unable to obtain a sufficient urine sample to send off culture at time of visit.  Patient asked at visit to return the next day to provide.  Did not show.  Megan, RT(R) called to follow up with patient and patient states she will not be coming in provide an additional sample.  Unable to discontinue Urine Culture in computer to represent this.

## 2018-11-25 LAB — CERVICOVAGINAL ANCILLARY ONLY
Bacterial vaginitis: POSITIVE — AB
Candida vaginitis: NEGATIVE
Chlamydia: NEGATIVE
Neisseria Gonorrhea: NEGATIVE
Trichomonas: POSITIVE — AB

## 2018-11-28 ENCOUNTER — Telehealth (HOSPITAL_COMMUNITY): Payer: Self-pay | Admitting: Emergency Medicine

## 2018-11-28 MED ORDER — METRONIDAZOLE 500 MG PO TABS: 500.0000 mg | ORAL_TABLET | Freq: Two times a day (BID) | ORAL | 0 refills | Status: AC

## 2018-11-28 NOTE — Telephone Encounter (Signed)
Patient contacted and made aware of all results, all questions answered.   

## 2018-11-28 NOTE — Telephone Encounter (Signed)
Bacterial vaginosis is positive. This was not treated at the urgent care visit.  Flagyl 500 mg BID x 7 days #14 no refills sent to patients pharmacy of choice.    Trichomonas is positive. Rx  for Flagyl was sent to the pharmacy of record. Pt needs education to refrain from sexual intercourse for 7 days to give the medicine time to work. Sexual partners need to be notified and tested/treated. Condoms may reduce risk of reinfection. Recheck for further evaluation if symptoms are not improving.   Attempted to reach patient. No answer at this time. Voicemail left.    

## 2018-12-06 ENCOUNTER — Ambulatory Visit: Payer: BLUE CROSS/BLUE SHIELD | Admitting: Nurse Practitioner

## 2019-01-25 DIAGNOSIS — L2084 Intrinsic (allergic) eczema: Secondary | ICD-10-CM | POA: Diagnosis not present

## 2019-04-12 ENCOUNTER — Other Ambulatory Visit: Payer: Self-pay

## 2019-04-12 ENCOUNTER — Encounter: Payer: Self-pay | Admitting: Podiatry

## 2019-04-12 ENCOUNTER — Ambulatory Visit (INDEPENDENT_AMBULATORY_CARE_PROVIDER_SITE_OTHER): Payer: BC Managed Care – PPO | Admitting: Podiatry

## 2019-04-12 DIAGNOSIS — M79672 Pain in left foot: Secondary | ICD-10-CM

## 2019-04-12 DIAGNOSIS — M79671 Pain in right foot: Secondary | ICD-10-CM

## 2019-04-12 DIAGNOSIS — Q828 Other specified congenital malformations of skin: Secondary | ICD-10-CM

## 2019-04-12 NOTE — Patient Instructions (Signed)

## 2019-04-16 NOTE — Progress Notes (Signed)
Subjective: Amber Arroyo presents to clinic with cc of painful mycotic toenails and porokeratotic lesions b/l which are aggravated when weightbearing with and without shoe gear.  This pain limits her daily activities. Pain symptoms resolve with periodic professional debridement.  She continues to work 2 jobs which require long periods of standing on her feet.  Pt is not diabetic as erroneously documented on ROS last visit.  Charlott Rakes, MD is her PCP.  Current Outpatient Medications on File Prior to Visit  Medication Sig Dispense Refill  . Dupilumab (DUPIXENT) 200 MG/1.14ML SOSY Inject into the skin.    . fluticasone (CUTIVATE) 0.05 % cream APPLY A THIN LAYER TO FACE TWICE A DAY USE FOR 1 WEEK ON AND 1 WEEK OFF AS NEEDED    . hydrocortisone butyrate (LUCOID) 0.1 % CREA cream APP EXT AA BID    . naproxen (NAPROSYN) 500 MG tablet Take 1 tablet (500 mg total) by mouth 2 (two) times daily. 30 tablet 0  . Skin Protectants, Misc. (EUCERIN) cream Apply topically as needed for dry skin. 454 g 3  . triamcinolone cream (KENALOG) 0.1 % APPLY EXTERNALLY TO THE AFFECTED AREA TWICE DAILY 80 g 0  . VENTOLIN HFA 108 (90 Base) MCG/ACT inhaler Inhale 2 puffs into the lungs every 6 (six) hours as needed for wheezing or shortness of breath. MUST MAKE APPT FOR FURTHER REFILLS 18 g 0   No current facility-administered medications on file prior to visit.      No Known Allergies   Objective: 23 yo AAF, morbidly obsese in NAD. AAO x 3.   Physical Examination:  Vascular  Examination: Capillary refill time <3 seconds b/l.  Palpable DP/PT pulses b/l.  Digital hair sparse b/l.  No edema noted b/l.  Skin temperature gradient WNL b/l.  Dermatological Examination: Skin with normal turgor, texture and tone b/l.  No open wounds b/l.  No interdigital macerations noted b/l.  Toenails adequate length and well manicured b/l.  Porokeratotic lesions submet head plantarlateral aspect both feet: right foot x  3 and left foot x 1 with tenderness to palpation. No erythema, no edema, no drainage, no flocculence.   Musculoskeletal Examination: Muscle strength 5/5 to all muscle groups b/l.  Pes planus foot type b/l.  No pain, crepitus or joint discomfort with active/passive ROM.  Neurological Examination: Sensation intact 5/5 b/l with 10 gram monofilament.  Vibratory sensation intact b/l.  Proprioceptive sensation intact b/l.  Assessment: Painful porokeratotic lesions b/l feet right foot x 3 and left foot x 1 Pain in feet Pes planus b/l  Plan: 1. Porokeratoses b/l feet pared and enucleated right foot x 3 and left foot x 1 with sterile scalpel blade. Iatrogenic laceration right foot lesion sustained during paring. Treated with Lumicain Hemostatic Solution and alcohol. Light dressing applied. No further treatment required by patient.  2. Will check benefits for orthotics for pes planus foot deformity. Orthotics/Prosthetics Dept will contact her. 3. Report any pedal injuries to medical professional. 4. Continue soft, supportive shoe gear daily. 5. Follow up 6 months. 6. Patient/POA to call should there be a question/concern in there interim.

## 2019-05-01 ENCOUNTER — Other Ambulatory Visit: Payer: Self-pay

## 2019-05-01 ENCOUNTER — Ambulatory Visit (INDEPENDENT_AMBULATORY_CARE_PROVIDER_SITE_OTHER): Payer: BC Managed Care – PPO | Admitting: Orthotics

## 2019-05-01 ENCOUNTER — Encounter: Payer: Self-pay | Admitting: Podiatry

## 2019-05-01 DIAGNOSIS — M2142 Flat foot [pes planus] (acquired), left foot: Secondary | ICD-10-CM

## 2019-05-01 DIAGNOSIS — Q828 Other specified congenital malformations of skin: Secondary | ICD-10-CM

## 2019-05-01 DIAGNOSIS — M2141 Flat foot [pes planus] (acquired), right foot: Secondary | ICD-10-CM

## 2019-05-01 NOTE — Progress Notes (Signed)
Patient came into today to be cast for [employer]. Upon recommendation of Dr. Elisha Ponder Patient presents with porokeratosis Goals are arch support, rf stability,  Plan vendor

## 2019-05-10 ENCOUNTER — Ambulatory Visit: Payer: BC Managed Care – PPO | Attending: Family Medicine | Admitting: Family Medicine

## 2019-05-10 ENCOUNTER — Other Ambulatory Visit: Payer: Self-pay

## 2019-05-10 ENCOUNTER — Encounter: Payer: Self-pay | Admitting: Family Medicine

## 2019-05-10 VITALS — BP 142/91 | HR 82 | Temp 98.0°F | Ht 62.0 in | Wt 286.0 lb

## 2019-05-10 DIAGNOSIS — L309 Dermatitis, unspecified: Secondary | ICD-10-CM

## 2019-05-10 DIAGNOSIS — J4520 Mild intermittent asthma, uncomplicated: Secondary | ICD-10-CM

## 2019-05-10 DIAGNOSIS — J3489 Other specified disorders of nose and nasal sinuses: Secondary | ICD-10-CM | POA: Diagnosis not present

## 2019-05-10 DIAGNOSIS — J302 Other seasonal allergic rhinitis: Secondary | ICD-10-CM

## 2019-05-10 DIAGNOSIS — R519 Headache, unspecified: Secondary | ICD-10-CM

## 2019-05-10 DIAGNOSIS — J45909 Unspecified asthma, uncomplicated: Secondary | ICD-10-CM | POA: Insufficient documentation

## 2019-05-10 DIAGNOSIS — L308 Other specified dermatitis: Secondary | ICD-10-CM | POA: Diagnosis not present

## 2019-05-10 MED ORDER — CETIRIZINE HCL 10 MG PO TABS
10.0000 mg | ORAL_TABLET | Freq: Every day | ORAL | 1 refills | Status: DC
Start: 2019-05-10 — End: 2020-04-22

## 2019-05-10 NOTE — Progress Notes (Signed)
Subjective:  Patient ID: Amber Arroyo, female    DOB: [birth year]  Age: 23 y.o. MRN: [id number]  CC: Headache   HPI Amber Arroyo is a 23 year old female with a history of eczema, asthma, seasonal allergies who presents today for follow-up visit. She complains of frontal headaches which have been present for the last couple weeks and has been compliant with Flonase which she obtains OTC but is not on an oral antihistamine.  Denies blurry vision, nausea or vomiting. Her eczema has been controlled ever since she commenced Dupixent which is being administered by her Dermatologist. Her asthma has been stable and she uses Proventil MDI sparingly with no recent asthma exacerbations  Past Medical History:  Diagnosis Date  . Allergy    eczema, seasonal allergies  . Asthma   . Eczema   . Obesity     Past Surgical History:  Procedure Laterality Date  . INCISION AND DRAINAGE ABSCESS Left 02/04/2013   Procedure: INCISION AND DRAINAGE POSTERIOR NECK ABSCESS;  Surgeon: Jodi Marble, MD;  Location: [place];  Service: ENT;  Laterality: Left;  . MOUTH SURGERY     Wisdom Teeth Extraction    Family History  Problem Relation Age of Onset  . Hypertension Mother   . Asthma Mother   . Eczema Mother   . Hypertension Father     No Known Allergies  Outpatient Medications Prior to Visit  Medication Sig Dispense Refill  . Dupilumab (DUPIXENT) 200 MG/1.14ML SOSY Inject into the skin.    . fluticasone (CUTIVATE) 0.05 % cream APPLY A THIN LAYER TO FACE TWICE A DAY USE FOR 1 WEEK ON AND 1 WEEK OFF AS NEEDED    . hydrocortisone butyrate (LUCOID) 0.1 % CREA cream APP EXT AA BID    . naproxen (NAPROSYN) 500 MG tablet Take 1 tablet (500 mg total) by mouth 2 (two) times daily. (Patient not taking: Reported on 05/10/2019) 30 tablet 0  . Skin Protectants, Misc. (EUCERIN) cream Apply topically as needed for dry skin. (Patient not taking: Reported on 05/10/2019) 454 g 3  . triamcinolone cream (KENALOG) 0.1 % APPLY  EXTERNALLY TO THE AFFECTED AREA TWICE DAILY (Patient not taking: Reported on 05/10/2019) 80 g 0  . VENTOLIN HFA 108 (90 Base) MCG/ACT inhaler Inhale 2 puffs into the lungs every 6 (six) hours as needed for wheezing or shortness of breath. MUST MAKE APPT FOR FURTHER REFILLS (Patient not taking: Reported on 05/10/2019) 18 g 0   No facility-administered medications prior to visit.      ROS Review of Systems  Constitutional: Negative for activity change, appetite change and fatigue.  HENT: Positive for postnasal drip. Negative for congestion, sinus pressure and sore throat.   Eyes: Negative for visual disturbance.  Respiratory: Negative for cough, chest tightness, shortness of breath and wheezing.   Cardiovascular: Negative for chest pain and palpitations.  Gastrointestinal: Negative for abdominal distention, abdominal pain and constipation.  Endocrine: Negative for polydipsia.  Genitourinary: Negative for dysuria and frequency.  Musculoskeletal: Negative for arthralgias and back pain.  Skin: Negative for rash.  Neurological: Positive for headaches. Negative for tremors, light-headedness and numbness.  Hematological: Does not bruise/bleed easily.  Psychiatric/Behavioral: Negative for agitation and behavioral problems.    Objective:  BP (!) 142/91   Pulse 82   Temp 98 F (36.7 C) (Oral)   Ht 5\' 2"  (1.575 m)   Wt 286 lb (129.7 kg)   SpO2 100%   BMI 52.31 kg/m   BP/Weight 05/10/2019 11/23/2018 08/65/7846  Systolic  BP 142 153 109  Diastolic BP 91 87 91  Wt. (Lbs) 286 - -  BMI 52.31 - -      Physical Exam Constitutional:      Appearance: She is well-developed.  Neck:     Vascular: No JVD.  Cardiovascular:     Rate and Rhythm: Normal rate.     Heart sounds: Normal heart sounds. No murmur.  Pulmonary:     Effort: Pulmonary effort is normal.     Breath sounds: Normal breath sounds. No wheezing or rales.  Chest:     Chest wall: No tenderness.  Abdominal:     General: Bowel  sounds are normal. There is no distension.     Palpations: Abdomen is soft. There is no mass.     Tenderness: There is no abdominal tenderness.  Musculoskeletal: Normal range of motion.     Right lower leg: No edema.     Left lower leg: No edema.  Neurological:     Mental Status: She is alert and oriented to person, place, and time.  Psychiatric:        Mood and Affect: Mood normal.     CMP Latest Ref Rng & Units 07/03/2017 11/03/2016 01/01/2016  Glucose 65 - 99 mg/dL 86 80 84  BUN 6 - 20 mg/dL 16 14 13   Creatinine 0.44 - 1.00 mg/dL 6.28 6.38  Sodium 135 - 145 mmol/L 139 142 138  Potassium 3.5 - 5.1 mmol/L 3.5 4.1 3.8  Chloride 101 - 111 mmol/L 103 103 105  CO2 22 - 32 mmol/L 27 25 26   Calcium 8.9 - 10.3 mg/dL 1.77) 9.4 )  Total Protein 6.0 - 8.5 g/dL - 7.0 7.2  Total Bilirubin 0.0 - 1.2 mg/dL - 1.1(A 0.6  Alkaline Phos 39 - 117 IU/L - 40 47  AST 0 - 40 IU/L - 15 26  ALT 0 - 32 IU/L - 16 26    Lipid Panel  No results found for: CHOL, TRIG, HDL, CHOLHDL, VLDL, LDLCALC, LDLDIRECT  CBC    Component Value Date/Time   WBC 10.0 07/04/2017 0534   RBC 4.68 07/04/2017 0534   HGB 12.1 07/04/2017 0534   HGB 12.9 05/18/2017 1210   HCT 37.9 07/04/2017 0534   HCT 39.9 05/18/2017 1210   PLT 213 07/04/2017 0534   PLT 275 05/18/2017 1210   MCV 81.0 07/04/2017 0534   MCV 80 05/18/2017 1210   MCH 25.9 (L) 07/04/2017 0534   MCHC 31.9 07/04/2017 0534   RDW 15.9 (H) 07/04/2017 0534   RDW 15.0 05/18/2017 1210   LYMPHSABS 1.9 07/03/2017 1849   LYMPHSABS 1.6 11/03/2016 1607   MONOABS 0.7 07/03/2017 1849   EOSABS 0.1 07/03/2017 1849   EOSABS 0.4 11/03/2016 1607   BASOSABS 0.0 07/03/2017 1849   BASOSABS 0.0 11/03/2016 1607    No results found for: HGBA1C  Assessment & Plan:   1. Eczema, unspecified type Controlled Currently on Dupixent Followed by dermatology - Basic Metabolic Panel - CBC with Differential/Platelet  2. Seasonal allergies Uncontrolled as she is  currently having sinus headaches Advised to use saline irrigation regularly - cetirizine (ZYRTEC) 10 MG tablet; Take 1 tablet (10 mg total) by mouth daily.  Dispense: 30 tablet; Refill: 1  3. Mild intermittent asthma without complication Intermittent with rare exacerbations She uses an MDI as needed sparingly    Meds ordered this encounter  Medications  . cetirizine (ZYRTEC) 10 MG tablet    Sig: Take 1 tablet (10 mg total)  by mouth daily.    Dispense:  30 tablet    Refill:  1    Follow-up: Return in about 6 months (around 11/07/2019) for Chronic medical conditions.       Hoy RegisterEnobong Storie Heffern, MD, FAAFP. [facility] [place], [place] [phone]   05/10/2019, 12:42 PM

## 2019-05-11 LAB — CBC WITH DIFFERENTIAL/PLATELET
Basophils Absolute: 0.1 10*3/uL (ref 0.0–0.2)
Basos: 1 %
EOS (ABSOLUTE): 0.2 10*3/uL (ref 0.0–0.4)
Eos: 5 %
Hematocrit: 37.5 % (ref 34.0–46.6)
Hemoglobin: 12.1 g/dL (ref 11.1–15.9)
Immature Grans (Abs): 0 10*3/uL (ref 0.0–0.1)
Immature Granulocytes: 0 %
Lymphocytes Absolute: 1.6 10*3/uL (ref 0.7–3.1)
Lymphs: 33 %
MCH: 25 pg — ABNORMAL LOW (ref 26.6–33.0)
MCHC: 32.3 g/dL (ref 31.5–35.7)
MCV: 78 fL — ABNORMAL LOW (ref 79–97)
Monocytes Absolute: 0.4 10*3/uL (ref 0.1–0.9)
Monocytes: 8 %
Neutrophils Absolute: 2.5 10*3/uL (ref 1.4–7.0)
Neutrophils: 53 %
Platelets: 303 10*3/uL (ref 150–450)
RBC: 4.84 x10E6/uL (ref 3.77–5.28)
RDW: 14.9 % (ref 11.7–15.4)
WBC: 4.8 10*3/uL (ref 3.4–10.8)

## 2019-05-11 LAB — BASIC METABOLIC PANEL
BUN/Creatinine Ratio: 20 (ref 9–23)
BUN: 12 mg/dL (ref 6–20)
CO2: 25 mmol/L (ref 20–29)
Calcium: 9.6 mg/dL (ref 8.7–10.2)
Chloride: 106 mmol/L (ref 96–106)
Creatinine, Ser: 0.59 mg/dL (ref 0.57–1.00)
GFR calc Af Amer: 149 mL/min/{1.73_m2} (ref >59)
GFR calc non Af Amer: 130 mL/min/{1.73_m2} (ref >59)
Glucose: 85 mg/dL (ref 65–99)
Potassium: 3.8 mmol/L (ref 3.5–5.2)
Sodium: 145 mmol/L — ABNORMAL HIGH (ref 134–144)

## 2019-05-29 ENCOUNTER — Other Ambulatory Visit: Payer: BC Managed Care – PPO | Admitting: Orthotics

## 2019-06-20 ENCOUNTER — Other Ambulatory Visit: Payer: BC Managed Care – PPO | Admitting: Orthotics

## 2019-07-07 ENCOUNTER — Other Ambulatory Visit: Payer: Self-pay

## 2019-07-07 ENCOUNTER — Ambulatory Visit: Payer: BC Managed Care – PPO | Admitting: Podiatry

## 2019-07-07 ENCOUNTER — Ambulatory Visit: Payer: BC Managed Care – PPO

## 2019-07-07 DIAGNOSIS — L74513 Primary focal hyperhidrosis, soles: Secondary | ICD-10-CM

## 2019-07-07 DIAGNOSIS — Q828 Other specified congenital malformations of skin: Secondary | ICD-10-CM

## 2019-07-07 DIAGNOSIS — M79671 Pain in right foot: Secondary | ICD-10-CM

## 2019-07-07 DIAGNOSIS — M79672 Pain in left foot: Secondary | ICD-10-CM | POA: Diagnosis not present

## 2019-07-10 ENCOUNTER — Encounter: Payer: Self-pay | Admitting: Podiatry

## 2019-07-10 NOTE — Progress Notes (Signed)
Subjective:  Patient ID: Amber Arroyo, female    DOB: 03/08/1996,  MRN: [id number]  Chief Complaint  Patient presents with  . Foot Pain    pt is here for a f/u of foot pain in both her ankle and feet, pt states that her feet still hurt since the last time she was here, and is looking to get a callus trim    24 y.o. female presents with the above complaint.  Patient presents with a follow-up of bilateral plantar foot pain.  Patient states the pain is worse when ambulating.  She states the pain is more exacerbated when putting pressure on both of her feet.  She states that she usually gets her train because it causes her a lot of pain.  Patient has not gotten orthotics yet.  Patient is being treated for bilateral porokeratosis with multiple debridements.  She denies any other acute complaints.   Review of Systems: Negative except as noted in the HPI. Denies N/V/F/Ch.  Past Medical History:  Diagnosis Date  . Allergy    eczema, seasonal allergies  . Asthma   . Eczema   . Obesity     Current Outpatient Medications:  .  cetirizine (ZYRTEC) 10 MG tablet, Take 1 tablet (10 mg total) by mouth daily., Disp: 30 tablet, Rfl: 1 .  Dupilumab (DUPIXENT) 200 MG/1.14ML SOSY, Inject into the skin., Disp: , Rfl:  .  fluticasone (CUTIVATE) 0.05 % cream, APPLY A THIN LAYER TO FACE TWICE A DAY USE FOR 1 WEEK ON AND 1 WEEK OFF AS NEEDED, Disp: , Rfl:  .  hydrocortisone butyrate (LUCOID) 0.1 % CREA cream, APP EXT AA BID, Disp: , Rfl:  .  naproxen (NAPROSYN) 500 MG tablet, Take 1 tablet (500 mg total) by mouth 2 (two) times daily., Disp: 30 tablet, Rfl: 0 .  Skin Protectants, Misc. (EUCERIN) cream, Apply topically as needed for dry skin., Disp: 454 g, Rfl: 3 .  triamcinolone cream (KENALOG) 0.1 %, APPLY EXTERNALLY TO THE AFFECTED AREA TWICE DAILY, Disp: 80 g, Rfl: 0 .  VENTOLIN HFA 108 (90 Base) MCG/ACT inhaler, Inhale 2 puffs into the lungs every 6 (six) hours as needed for wheezing or shortness of breath.  MUST MAKE APPT FOR FURTHER REFILLS, Disp: 18 g, Rfl: 0  Social History   Tobacco Use  Smoking Status Never Smoker  Smokeless Tobacco Never Used    No Known Allergies Objective:  There were no vitals filed for this visit. There is no height or weight on file to calculate BMI. Constitutional Well developed. Well nourished.  Vascular Dorsalis pedis pulses palpable bilaterally. Posterior tibial pulses palpable bilaterally. Capillary refill normal to all digits.  No cyanosis or clubbing noted. Pedal hair growth normal.  Neurologic Normal speech. Oriented to person, place, and time. Epicritic sensation to light touch grossly present bilaterally.  Dermatologic  multiple plantar porokeratosis to bilateral foot.  No pinpoint bleeding noted.  There is nucleated central core noted along these lesions.  Orthopedic: Normal joint ROM without pain or crepitus bilaterally. No visible deformities. No bony tenderness.   Radiographs: None Assessment:   1. Foot pain, bilateral    Plan:  Patient was evaluated and treated and all questions answered.  Benign skin lesions with central core -Using a chisel blade and a handle hyperkeratotic lesion was aggressively debrided down.  The central nucleated core was excised out. -I discussed with her that I may perform a destruction of the lesion with Cantharone therapy during next visit if these are  not getting resolved.  Hyperhidrosis of bilateral feet -I explained to the patient the etiology of hyperhidrosis and various treatment options associated with it.  I asked the patient to apply foot powder.  Patient states understanding will apply over-the-counter.   Return in about 3 months (around 10/05/2019).

## 2019-07-11 ENCOUNTER — Ambulatory Visit: Payer: BC Managed Care – PPO | Admitting: Sports Medicine

## 2019-09-25 DIAGNOSIS — M25571 Pain in right ankle and joints of right foot: Secondary | ICD-10-CM | POA: Diagnosis not present

## 2019-09-25 DIAGNOSIS — M25572 Pain in left ankle and joints of left foot: Secondary | ICD-10-CM | POA: Diagnosis not present

## 2019-10-09 ENCOUNTER — Other Ambulatory Visit: Payer: Self-pay

## 2019-10-09 ENCOUNTER — Encounter: Payer: Self-pay | Admitting: Podiatry

## 2019-10-09 ENCOUNTER — Ambulatory Visit (INDEPENDENT_AMBULATORY_CARE_PROVIDER_SITE_OTHER): Payer: BC Managed Care – PPO | Admitting: Podiatry

## 2019-10-09 DIAGNOSIS — B351 Tinea unguium: Secondary | ICD-10-CM

## 2019-10-09 DIAGNOSIS — M79671 Pain in right foot: Secondary | ICD-10-CM

## 2019-10-09 DIAGNOSIS — M79674 Pain in right toe(s): Secondary | ICD-10-CM | POA: Diagnosis not present

## 2019-10-09 DIAGNOSIS — M79672 Pain in left foot: Secondary | ICD-10-CM | POA: Diagnosis not present

## 2019-10-09 DIAGNOSIS — M79675 Pain in left toe(s): Secondary | ICD-10-CM

## 2019-10-09 DIAGNOSIS — Q828 Other specified congenital malformations of skin: Secondary | ICD-10-CM | POA: Diagnosis not present

## 2019-10-09 NOTE — Patient Instructions (Signed)

## 2019-10-11 ENCOUNTER — Ambulatory Visit: Payer: BC Managed Care – PPO | Admitting: Podiatry

## 2019-10-12 NOTE — Progress Notes (Signed)
Subjective: Amber Arroyo presents today for follow up of painful porokeratotic lesion(s) b/l feet and painful mycotic toenails b/l that limit ambulation. Aggravating factors include weightbearing with and without shoe gear. Pain for both is relieved with periodic professional debridement.   She is also inquiring about orthotics she was casted for on her last visit to the clinic.   No Known Allergies   Objective: There were no vitals filed for this visit.  Pt is a pleasant 24 y.o. year old AA female, morbidly obese  in NAD. AAO x 3.   Vascular Examination:  Capillary fill time to digits <3 seconds b/l. Palpable DP pulses b/l. Palpable PT pulses b/l. Pedal hair sparse b/l. Skin temperature gradient within normal limits b/l.  Dermatological Examination: Pedal skin with normal turgor, texture and tone bilaterally. No open wounds bilaterally. No interdigital macerations bilaterally. Toenails 1-5 b/l elongated, dystrophic, thickened, crumbly with subungual debris and tenderness to dorsal palpation. Porokeratotic lesion(s) plantar aspect of right foot x 3 and left foot x1. No erythema, no edema, no drainage, no flocculence.  Musculoskeletal: Normal muscle strength 5/5 to all lower extremity muscle groups bilaterally, no pain crepitus or joint limitation noted with ROM b/l, pes planus deformity noted and patient ambulates independent of any assistive aids.  Neurological: Protective sensation intact 5/5 intact bilaterally with 10g monofilament b/l. Vibratory sensation intact b/l.  Assessment: 1. Pain due to onychomycosis of toenails of both feet   2. Porokeratosis   3. Foot pain, bilateral    Plan: -Toenails 1-5 b/l were debrided in length and girth with sterile nail nippers and dremel without iatrogenic bleeding.  -Painful porokeratotic lesion(s) plantar aspect right foot x 3 and left foot x 1 pared and enucleated with sterile scalpel blade without incident. -Patient to continue soft,  supportive shoe gear daily. -Patient to report any pedal injuries to medical professional immediately. -Patient to follow up with Amber Arroyo for dispensing of her orthotics. -Patient/POA to call should there be question/concern in the interim.  Return in about 6 months (around 04/09/2020) for nail trim.

## 2019-10-17 ENCOUNTER — Ambulatory Visit: Payer: BC Managed Care – PPO | Attending: Family | Admitting: Family

## 2019-10-17 ENCOUNTER — Other Ambulatory Visit: Payer: Self-pay

## 2019-10-17 ENCOUNTER — Encounter: Payer: Self-pay | Admitting: Family

## 2019-10-17 VITALS — BP 127/75 | HR 92 | Temp 97.9°F | Resp 16 | Wt 288.6 lb

## 2019-10-17 DIAGNOSIS — R600 Localized edema: Secondary | ICD-10-CM | POA: Diagnosis not present

## 2019-10-17 DIAGNOSIS — M7989 Other specified soft tissue disorders: Secondary | ICD-10-CM | POA: Diagnosis not present

## 2019-10-17 NOTE — Patient Instructions (Signed)
Continue compression socks and shoe insoles. Follow-up with primary physician as needed. Leg Cramps Leg cramps occur when one or more muscles tighten and you have no control over this tightening (involuntary muscle contraction). Muscle cramps can develop in any muscle, but the most common place is in the calf muscles of the leg. Those cramps can occur during exercise or when you are at rest. Leg cramps are painful, and they may last for a few seconds to a few minutes. Cramps may return several times before they finally stop. Usually, leg cramps are not caused by a serious medical problem. In many cases, the cause is not known. Some common causes include:  Excessive physical effort (overexertion), such as during intense exercise.  Overuse from repetitive motions, or doing the same thing over and over.  Staying in a certain position for a long period of time.  Improper preparation, form, or technique while performing a sport or an activity.  Dehydration.  Injury.  Side effects of certain medicines.  Abnormally low levels of minerals in your blood (electrolytes), especially potassium and calcium. This could result from: ? Pregnancy. ? Taking diuretic medicines. Follow these instructions at home: Eating and drinking  Drink enough fluid to keep your urine pale yellow. Staying hydrated may help prevent cramps.  Eat a healthy diet that includes plenty of nutrients to help your muscles function. A healthy diet includes fruits and vegetables, lean protein, whole grains, and low-fat or nonfat dairy products. Managing pain, stiffness, and swelling      Try massaging, stretching, and relaxing the affected muscle. Do this for several minutes at a time.  If directed, put ice on areas that are sore or painful after a cramp: ? Put ice in a plastic bag. ? Place a towel between your skin and the bag. ? Leave the ice on for 20 minutes, 2-3 times a day.  If directed, apply heat to muscles that  are tense or tight. Do this before you exercise, or as often as told by your health care provider. Use the heat source that your health care provider recommends, such as a moist heat pack or a heating pad. ? Place a towel between your skin and the heat source. ? Leave the heat on for 20-30 minutes. ? Remove the heat if your skin turns bright red. This is especially important if you are unable to feel pain, heat, or cold. You may have a greater risk of getting burned.  Try taking hot showers or baths to help relax tight muscles. General instructions  If you are having frequent leg cramps, avoid intense exercise for several days.  Take over-the-counter and prescription medicines only as told by your health care provider.  Keep all follow-up visits as told by your health care provider. This is important. Contact a health care provider if:  Your leg cramps get more severe or more frequent, or they do not improve over time.  Your foot becomes cold, numb, or blue. Summary  Muscle cramps can develop in any muscle, but the most common place is in the calf muscles of the leg.  Leg cramps are painful, and they may last for a few seconds to a few minutes.  Usually, leg cramps are not caused by a serious medical problem. Often, the cause is not known.  Stay hydrated and take over-the-counter and prescription medicines only as told by your health care provider. This information is not intended to replace advice given to you by your health care provider.  Make sure you discuss any questions you have with your health care provider. Document Revised: 05/21/2017 Document Reviewed: 03/18/2017 Elsevier Patient Education  2020 [employer].

## 2019-10-17 NOTE — Progress Notes (Signed)
Patient ID: Amber Arroyo, female    DOB: 21-Feb-1996  MRN: [id number]  CC: Bilateral leg swelling  Subjective: Amber Arroyo is a 24 y.o. female with history of benign essential hypertension, asthma, eczema, abscess of neck, obesity, seasonal allergies, and cellulitis who presents for bilateral leg swelling.  1. BILATERAL LEGS AND FEET SWELLING:  Location: bilateral Onset: can not remember when this began, swelling comes and goes and does not happen everyday Progression: typically in the morning both legs and feet are not swollen but as the day progresses both legs and feet become swollen  Pain rating: 0/10 for bilateral legs, bilateral feet hurt sometimes and was taking Aleve for this which helped. States was told by her foot doctor to quit taking Aleve as this may increase swelling. Makes worse: Standing for long periods of time. 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 Reports working at least 16 hours daily. Makes better: compression socks and shoe insoles Ambulation: Reports sometimes does walk with a limp and does have some foot pain. Reports she  was told by foot doctor to wear insoles which she reports does help.    Severity/limitations to activities of daily living: able to complete activities as normal Comments: Reports had an appointment with foot doctor last week where she was told to quit taking Aleve as this may cause swelling and to follow-up with  PCP. Reports she had an xray of feet in the past and everything was normal. States that she has been wearing compression socks for about 1 week which helps. Reports her diet consists primarily of fried food, processed foods, chips, and soda. States that she has been told to lose weight to help with legs and feet swelling. States she does not exercise. Denies shortness of breath, chest pain, and numbness and tingling of legs and feet.   Patient Active Problem List   Diagnosis Date Noted  . Asthma   . Cellulitis  07/03/2017  . Seasonal allergies 02/10/2016  . Eczema 01/07/2016  . Obesity 01/07/2016  . Benign essential hypertension 07/17/2013  . Abscess of neck 02/04/2013     Current Outpatient Medications on File Prior to Visit  Medication Sig Dispense Refill  . cetirizine (ZYRTEC) 10 MG tablet Take 1 tablet (10 mg total) by mouth daily. 30 tablet 1  . Dupilumab (DUPIXENT) 200 MG/1. SOSY Inject into the skin.    . fluticasone (CUTIVATE) 0.05 % cream APPLY A THIN LAYER TO FACE TWICE A DAY USE FOR 1 WEEK ON AND 1 WEEK OFF AS NEEDED    . hydrocortisone butyrate (LUCOID) 0.1 % CREA cream APP EXT AA BID    . naproxen (NAPROSYN) 500 MG tablet Take 1 tablet (500 mg total) by mouth 2 (two) times daily. 30 tablet 0  . Skin Protectants, Misc. (EUCERIN) cream Apply topically as needed for dry skin. 454 g 3  . triamcinolone cream (KENALOG) 0.1 % APPLY EXTERNALLY TO THE AFFECTED AREA TWICE DAILY 80 g 0  . VENTOLIN HFA 108 (90 Base) MCG/ACT inhaler Inhale 2 puffs into the lungs every 6 (six) hours as needed for wheezing or shortness of breath. MUST MAKE APPT FOR FURTHER REFILLS 18 g 0   No current facility-administered medications on file prior to visit.    No Known Allergies  Social History   Socioeconomic History  . Marital status: Single    Spouse name: Not on file  . Number of children: Not on file  . Years of education: Not  on file  . Highest education level: Not on file  Occupational History  . Not on file  Tobacco Use  . Smoking status: Never Smoker  . Smokeless tobacco: Never Used  Substance and Sexual Activity  . Alcohol use: No  . Drug use: No  . Sexual activity: Yes    Birth control/protection: Condom    Comment: LMP was last week  Other Topics Concern  . Not on file  Social History Narrative   Lives at home with mother.  No pets.   Social Determinants of Health   Financial Resource Strain:   . Difficulty of Paying Living Expenses:   Food Insecurity:   . Worried About  Charity fundraiser in the Last Year:   . Arboriculturist in the Last Year:   Transportation Needs:   . Film/video editor (Medical):   Marland Kitchen Lack of Transportation (Non-Medical):   Physical Activity:   . Days of Exercise per Week:   . Minutes of Exercise per Session:   Stress:   . Feeling of Stress :   Social Connections:   . Frequency of Communication with Friends and Family:   . Frequency of Social Gatherings with Friends and Family:   . Attends Religious Services:   . Active Member of Clubs or Organizations:   . Attends Archivist Meetings:   Marland Kitchen Marital Status:   Intimate Partner Violence:   . Fear of Current or Ex-Partner:   . Emotionally Abused:   Marland Kitchen Physically Abused:   . Sexually Abused:     Family History  Problem Relation Age of Onset  . Hypertension Mother   . Asthma Mother   . Eczema Mother   . Hypertension Father     Past Surgical History:  Procedure Laterality Date  . INCISION AND DRAINAGE ABSCESS Left 02/04/2013   Procedure: INCISION AND DRAINAGE POSTERIOR NECK ABSCESS;  Surgeon: Jodi Marble, MD;  Location: [facility] [facility];  Service: ENT;  Laterality: Left;  . MOUTH SURGERY     Wisdom Teeth Extraction    ROS: Review of Systems Negative except as stated above  PHYSICAL EXAM: Vitals with BMI 10/17/2019 05/10/2019 11/23/2018  Height - 5\' 2"  -  Weight 288 lbs 10 oz 286 lbs -  BMI - 34.1 -  Systolic [phone]  Diastolic 75 91 87  Pulse 92 82 89  SpO2- 98%, room air Temperature- 97.9 F, oral  Physical Exam General appearance - alert, well appearing, and in no distress, oriented to person, place, and time and overweight Mental status - alert, oriented to person, place, and time, normal mood, behavior, speech, dress, motor activity, and thought processes Neck - supple, no significant adenopathy Lymphatics - no palpable lymphadenopathy, no hepatosplenomegaly Chest - clear to auscultation, no wheezes, rales or rhonchi, symmetric air entry, no tachypnea,  retractions or cyanosis Heart - normal rate, regular rhythm, normal S1, S2, no murmurs, rubs, clicks or gallops Neurological - alert, oriented, normal speech, no focal findings or movement disorder noted, neck supple without rigidity, cranial nerves II through XII intact, funduscopic exam normal, discs flat and sharp, DTR's normal and symmetric, motor and sensory grossly normal bilaterally, normal muscle tone, no tremors, strength 5/5, Romberg sign negative, normal gait and station Musculoskeletal - no joint tenderness or deformity, no muscular tenderness noted, full range of motion without pain Extremities - peripheral pulses normal, no clubbing or cyanosis, bilateral pedal edema 1 +, feet normal temperature and sensation, monofilament sensory exam is normal  in both feet, right lower extremity edema 2+, left lower extremity edema 1+, no redness or tenderness in the calves or thighs, no ulcers, gangrene or atrophic changes Skin - normal coloration and turgor, no rashes, no suspicious skin lesions noted  ASSESSMENT AND PLAN: 1. Bilateral leg edema: -Continue wearing compression socks to help with bilateral leg edema.  -Monitor intake of sodium which may decrease swelling. -Exercise at least 150 minutes of moderate intensity exercise each week to assist with weight management which may also decrease swelling. -Follow-up with primary physician as needed if this does not improve or worsen. Seek medical attention immediately if this becomes severe. Patient verbalized understanding.   2. Bilateral swelling of feet: -Continue wearing compression socks to help with bilateral feet edema. -Monitor intake of sodium which may decrease swelling. -May take over-the-counter Acetaminophen for feet pain as she was advised by her foot doctor that taking Aleve may increase swelling.  -Exercise at least 150 minutes of moderate intensity exercise each week to assist with weight management which may decrease  swelling. -Wear shoe insoles as directed by your foot doctor. -Follow-up with primary physician as needed if this does not improve or worsen. Seek medical attention immediately if this becomes severe. Patient verbalized understanding.  Patient was given the opportunity to ask questions.  Patient verbalized understanding of the plan and was able to repeat key elements of the plan. Patient was given clear instructions to go to Emergency Department or return to medical center if symptoms don't improve, worsen, or new problems develop.The patient verbalized understanding.    Requested Prescriptions    No prescriptions requested or ordered in this encounter    Jalisha Enneking Jodi Geralds, NP

## 2019-10-18 ENCOUNTER — Other Ambulatory Visit: Payer: Self-pay | Admitting: Orthotics

## 2019-12-18 DIAGNOSIS — Z7251 High risk heterosexual behavior: Secondary | ICD-10-CM | POA: Diagnosis not present

## 2019-12-18 DIAGNOSIS — N926 Irregular menstruation, unspecified: Secondary | ICD-10-CM | POA: Diagnosis not present

## 2019-12-18 DIAGNOSIS — N979 Female infertility, unspecified: Secondary | ICD-10-CM | POA: Diagnosis not present

## 2020-01-19 ENCOUNTER — Ambulatory Visit
Admission: EM | Admit: 2020-01-19 | Discharge: 2020-01-19 | Disposition: A | Discharge status: Home / Self Care | Payer: BC Managed Care – PPO | Attending: Emergency Medicine | Admitting: Emergency Medicine

## 2020-01-19 ENCOUNTER — Other Ambulatory Visit: Payer: Self-pay

## 2020-01-19 DIAGNOSIS — R519 Headache, unspecified: Secondary | ICD-10-CM

## 2020-01-19 DIAGNOSIS — L03116 Cellulitis of left lower limb: Secondary | ICD-10-CM

## 2020-01-19 MED ORDER — DOXYCYCLINE HYCLATE 100 MG PO CAPS: 100.0000 mg | ORAL_CAPSULE | Freq: Two times a day (BID) | ORAL | 0 refills | Status: AC

## 2020-01-19 NOTE — Discharge Instructions (Signed)
Keep skin clean - may use gentle soaps without perfumes/dyes. Avoid hot water (showers, baths) as this can further dry out and irritate skin. Pat skin dry as rubbing can irritate and tear skin. Apply a gentle moisturizer 1-2 times daily. 

## 2020-01-19 NOTE — ED Provider Notes (Signed)
[name]    CSN: [id number] Arrival date & time: 01/19/20  1324      History   Chief Complaint Chief Complaint  Patient presents with  . Fever    HPI Amber Arroyo is a 24 y.o. female  Subjective:  Amber Arroyo is a 24 y.o. female who presents for evaluation of headache. Symptoms began about 1 day ago. Rapidity of onset was unknown. The patient gets headaches infrequently. The headache is described as moderate, dull and poorly described and is temporal, frontal in location.  The patient rates the pain a 0 currently on a scale from 1 to 10. Precipitating factors include none which have been determined. The headache was not preceded by an aura. Associated neurologic symptoms which are present include vomiting in the early morning. The patient denies decreased physical activity, depression, dizziness, loss of balance, muscle weakness, numbness of extremities, speech difficulties and vision problems. Other associated symptoms include fever.  Patient denies abdominal pain, chest pain, cough, diarrhea, earache, fatigue, hoarseness, nasal congestion and neck stiffness. Home treatment has included acetaminophen with good improvement. Other history includes: rash to LLE x 1 day. Family history includes no known family members with significant headaches. The following portions of the patient's history were reviewed and updated as appropriate: allergies, current medications, past family history, past medical history, past social history, past surgical history and problem list.       Past Medical History:  Diagnosis Date  . Allergy    eczema, seasonal allergies  . Asthma   . Eczema   . Obesity     Patient Active Problem List   Diagnosis Date Noted  . Asthma   . Cellulitis 07/03/2017  . Seasonal allergies 02/10/2016  . Eczema 01/07/2016  . Obesity 01/07/2016  . Benign essential hypertension 07/17/2013  . Abscess of neck 02/04/2013    Past Surgical History:  Procedure  Laterality Date  . INCISION AND DRAINAGE ABSCESS Left 02/04/2013   Procedure: INCISION AND DRAINAGE POSTERIOR NECK ABSCESS;  Surgeon: Flo Shanks, MD;  Location: [facility];  Service: ENT;  Laterality: Left;  . MOUTH SURGERY     Wisdom Teeth Extraction    OB History   No obstetric history on file.      Home Medications    Prior to Admission medications   Medication Sig Start Date End Date Taking? Authorizing Provider  cetirizine (ZYRTEC) 10 MG tablet Take 1 tablet (10 mg total) by mouth daily. 05/10/19   Hoy Register, MD  doxycycline (VIBRAMYCIN) 100 MG capsule Take 1 capsule (100 mg total) by mouth 2 (two) times daily for 7 days. 01/19/20 01/26/20  Hall-Potvin, [place], PA-C  hydrocortisone butyrate (LUCOID) 0.1 % CREA cream APP EXT AA BID 01/22/19   [provider]  triamcinolone cream (KENALOG) 0.1 % APPLY EXTERNALLY TO THE AFFECTED AREA TWICE DAILY 02/25/18   Hoy Register, MD  VENTOLIN HFA 108 (90 Base) MCG/ACT inhaler Inhale 2 puffs into the lungs every 6 (six) hours as needed for wheezing or shortness of breath. MUST MAKE APPT FOR FURTHER REFILLS 07/22/18   Hoy Register, MD    Family History Family History  Problem Relation Age of Onset  . Hypertension Mother   . Asthma Mother   . Eczema Mother   . Hypertension Father     Social History Social History   Tobacco Use  . Smoking status: Never Smoker  . Smokeless tobacco: Never Used  Substance Use Topics  . Alcohol use: No  . Drug use:  No     Allergies   Patient has no known allergies.   Review of Systems Review of Systems  Neurological:       No headache today "I feel much better today"  All other systems reviewed and are negative.    Physical Exam Triage Vital Signs ED Triage Vitals  Enc Vitals Group     BP 01/19/20 1408 (!) 133/83     Pulse Rate 01/19/20 1408 92     Resp 01/19/20 1408 18     Temp 01/19/20 1408 98 F (36.7 C)     Temp Source 01/19/20 1408 Oral     SpO2 01/19/20 1408 97 %       Weight --      Height --      Head Circumference --      Peak Flow --      Pain Score 01/19/20 1409 5     Pain Loc --      Pain Edu? --      Excl. in GC? --    No data found.  Updated Vital Signs BP (!) 133/83 (BP Location: Left Arm)   Pulse 92   Temp 98 F (36.7 C) (Oral)   Resp 18   LMP 01/18/2020   SpO2 97%   Visual Acuity Right Eye Distance:   Left Eye Distance:   Bilateral Distance:    Right Eye Near:   Left Eye Near:    Bilateral Near:     Physical Exam Constitutional:      General: She is not in acute distress.    Appearance: She is obese. She is not ill-appearing.  HENT:     Head: Normocephalic and atraumatic.     Right Ear: Tympanic membrane, ear canal and external ear normal.     Left Ear: Tympanic membrane, ear canal and external ear normal.     Mouth/Throat:     Mouth: Mucous membranes are moist.     Pharynx: Oropharynx is clear.  Eyes:     General: No scleral icterus.    Extraocular Movements: Extraocular movements intact.     Conjunctiva/sclera: Conjunctivae normal.     Pupils: Pupils are equal, round, and reactive to light.  Cardiovascular:     Rate and Rhythm: Normal rate.  Pulmonary:     Effort: Pulmonary effort is normal. No respiratory distress.     Breath sounds: No wheezing.  Abdominal:     Palpations: Abdomen is soft.     Tenderness: There is no abdominal tenderness.  Musculoskeletal:        General: No deformity. Normal range of motion.     Cervical back: Normal range of motion. No rigidity or tenderness.  Lymphadenopathy:     Cervical: No cervical adenopathy.  Skin:    General: Skin is warm.     Capillary Refill: Capillary refill takes less than 2 seconds.     Coloration: Skin is not jaundiced.     Findings: Erythema and rash present. No bruising.     Comments: TTP of LLE  Neurological:     Mental Status: She is alert.     Cranial Nerves: Cranial nerves are intact.     Sensory: Sensation is intact.     Motor: Motor  function is intact.     Coordination: Coordination is intact.     Gait: Gait is intact.  Psychiatric:        Mood and Affect: Mood normal.  Behavior: Behavior normal.      UC Treatments / Results  Labs (all labs ordered are listed, but only abnormal results are displayed) Labs Reviewed  NOVEL CORONAVIRUS, NAA    EKG   Radiology No results found.  Procedures Procedures (including critical care time)  Medications Ordered in UC Medications - No data to display  Initial Impression / Assessment and Plan / UC Course  I have reviewed the triage vital signs and the nursing notes.  Pertinent labs & imaging results that were available during my care of the patient were reviewed by me and considered in my medical decision making (see chart for details).     Patient afebrile, nontoxic, with SpO2 97%.  No headache/emesis/nausea today.  LLE concerning for possible cellulitis: will start doxy.  Covid PCR pending.  Patient to quarantine until results are back.  We will treat supportively as outlined below.  Return precautions discussed, patient verbalized understanding and is agreeable to plan. Final Clinical Impressions(s) / UC Diagnoses   Final diagnoses:  Nonintractable headache, unspecified chronicity pattern, unspecified headache type  Cellulitis of leg, left     Discharge Instructions     Keep skin clean - may use gentle soaps without perfumes/dyes. Avoid hot water (showers, baths) as this can further dry out and irritate skin. Pat skin dry as rubbing can irritate and tear skin. Apply a gentle moisturizer 1-2 times daily.    ED Prescriptions    Medication Sig Dispense Auth. Provider   doxycycline (VIBRAMYCIN) 100 MG capsule Take 1 capsule (100 mg total) by mouth 2 (two) times daily for 7 days. 14 capsule Hall-Potvin, [place], PA-C     PDMP not reviewed this encounter.   Hall-Potvin, [place], [place] 01/19/20 1428

## 2020-01-19 NOTE — ED Triage Notes (Addendum)
Pt states had a severe headache yesterday and vomit x1 due to pain. States started running a fever last night. States still having headaches off and on. Pt states has redness and swelling to LLE since yesterday. Last tylenol was at 9am.

## 2020-01-20 LAB — SARS-COV-2, NAA 2 DAY TAT

## 2020-01-20 LAB — NOVEL CORONAVIRUS, NAA: SARS-CoV-2, NAA: NOT DETECTED

## 2020-02-07 DIAGNOSIS — L218 Other seborrheic dermatitis: Secondary | ICD-10-CM | POA: Diagnosis not present

## 2020-02-07 DIAGNOSIS — L2084 Intrinsic (allergic) eczema: Secondary | ICD-10-CM | POA: Diagnosis not present

## 2020-02-07 DIAGNOSIS — L298 Other pruritus: Secondary | ICD-10-CM | POA: Diagnosis not present

## 2020-03-18 ENCOUNTER — Other Ambulatory Visit: Payer: Self-pay

## 2020-03-18 ENCOUNTER — Ambulatory Visit
Admission: EM | Admit: 2020-03-18 | Discharge: 2020-03-18 | Disposition: A | Discharge status: Home / Self Care | Payer: BC Managed Care – PPO | Attending: Physician Assistant | Admitting: Physician Assistant

## 2020-03-18 DIAGNOSIS — Z1152 Encounter for screening for COVID-19: Secondary | ICD-10-CM | POA: Diagnosis not present

## 2020-03-18 DIAGNOSIS — R432 Parageusia: Secondary | ICD-10-CM

## 2020-03-18 DIAGNOSIS — R0981 Nasal congestion: Secondary | ICD-10-CM

## 2020-03-18 MED ORDER — AFRIN NASAL SPRAY 0.05 % NA SOLN: 1.0000 | Freq: Two times a day (BID) | NASAL | 0 refills | Status: DC | PRN

## 2020-03-18 NOTE — ED Provider Notes (Signed)
[name]    CSN: [id number] Arrival date & time: 03/18/20  1515      History   Chief Complaint Chief Complaint  Patient presents with  . Nasal Congestion    HPI Amber Arroyo is a 24 y.o. female.   24 year old female comes in for 4 day of COVID like symptoms. Nasal congestion, rhinorrhea, now with loss of taste/smell. Denies fever, chills, body aches. Denies abdominal pain, nausea, vomiting, diarrhea. Denies shortness of breath. Never smoker. No COVID vaccine. flonase without relief.      Past Medical History:  Diagnosis Date  . Allergy    eczema, seasonal allergies  . Asthma   . Eczema   . Obesity     Patient Active Problem List   Diagnosis Date Noted  . Asthma   . Cellulitis 07/03/2017  . Seasonal allergies 02/10/2016  . Eczema 01/07/2016  . Obesity 01/07/2016  . Benign essential hypertension 07/17/2013  . Abscess of neck 02/04/2013    Past Surgical History:  Procedure Laterality Date  . INCISION AND DRAINAGE ABSCESS Left 02/04/2013   Procedure: INCISION AND DRAINAGE POSTERIOR NECK ABSCESS;  Surgeon: Flo Shanks, MD;  Location[facility];  Service: ENT;  Laterality: Left;  . MOUTH SURGERY     Wisdom Teeth Extraction    OB History   No obstetric history on file.      Home Medications    Prior to Admission medications   Medication Sig Start Date End Date Taking? Authorizing Provider  cetirizine (ZYRTEC) 10 MG tablet Take 1 tablet (10 mg total) by mouth daily. 05/10/19   Hoy Register, MD  oxymetazoline (AFRIN NASAL SPRAY) 0.05 % nasal spray Place 1 spray into both nostrils 2 (two) times daily as needed for congestion (do not use more than 3 days in a row.). 03/18/20   Cathie Hoops, Tata Timmins V, PA-C  triamcinolone cream (KENALOG) 0.1 % APPLY EXTERNALLY TO THE AFFECTED AREA TWICE DAILY 02/25/18   Hoy Register, MD  VENTOLIN HFA 108 (90 Base) MCG/ACT inhaler Inhale 2 puffs into the lungs every 6 (six) hours as needed for wheezing or shortness of breath. MUST  MAKE APPT FOR FURTHER REFILLS 07/22/18   Hoy Register, MD    Family History Family History  Problem Relation Age of Onset  . Hypertension Mother   . Asthma Mother   . Eczema Mother   . Hypertension Father     Social History Social History   Tobacco Use  . Smoking status: Never Smoker  . Smokeless tobacco: Never Used  Substance Use Topics  . Alcohol use: No  . Drug use: No     Allergies   Patient has no known allergies.   Review of Systems Review of Systems  Reason unable to perform ROS: See HPI as above.     Physical Exam Triage Vital Signs ED Triage Vitals  Enc Vitals Group     BP 03/18/20 1557 (!) 142/100     Pulse Rate 03/18/20 1557 88     Resp 03/18/20 1557 16     Temp 03/18/20 1557 98.5 F (36.9 C)     Temp Source 03/18/20 1557 Oral     SpO2 03/18/20 1557 98 %     Weight --      Height --      Head Circumference --      Peak Flow --      Pain Score 03/18/20 1601 0     Pain Loc --  Pain Edu? --      Excl. in [place]? --    No data found.  Updated Vital Signs BP (!) 142/100 (BP Location: Left Arm)   Pulse 88   Temp 98.5 F (36.9 C) (Oral)   Resp 16   LMP 02/26/2020   SpO2 98%   Physical Exam Constitutional:      General: She is not in acute distress.    Appearance: Normal appearance. She is well-developed. She is not ill-appearing, toxic-appearing or diaphoretic.  HENT:     Head: Normocephalic and atraumatic.     Right Ear: Ear canal and external ear normal. Tympanic membrane is erythematous. Tympanic membrane is not bulging.     Left Ear: Tympanic membrane, ear canal and external ear normal. Tympanic membrane is not erythematous or bulging.     Nose: Congestion present.     Right Turbinates: Swollen.     Right Sinus: No maxillary sinus tenderness or frontal sinus tenderness.     Left Sinus: No maxillary sinus tenderness or frontal sinus tenderness.     Comments: Right nostril nonpatent.     Mouth/Throat:     Mouth: Mucous membranes  are moist.     Pharynx: Oropharynx is clear. Uvula midline.  Eyes:     Conjunctiva/sclera: Conjunctivae normal.     Pupils: Pupils are equal, round, and reactive to light.  Cardiovascular:     Rate and Rhythm: Normal rate and regular rhythm.  Pulmonary:     Effort: Pulmonary effort is normal. No accessory muscle usage, prolonged expiration, respiratory distress or retractions.     Breath sounds: No decreased air movement or transmitted upper airway sounds. No decreased breath sounds.     Comments: LCTAB Musculoskeletal:     Cervical back: Normal range of motion and neck supple.  Skin:    General: Skin is warm and dry.  Neurological:     Mental Status: She is alert and oriented to person, place, and time.      UC Treatments / Results  Labs (all labs ordered are listed, but only abnormal results are displayed) Labs Reviewed  NOVEL CORONAVIRUS, NAA    EKG   Radiology No results found.  Procedures Procedures (including critical care time)  Medications Ordered in UC Medications - No data to display  Initial Impression / Assessment and Plan / UC Course  I have reviewed the triage vital signs and the nursing notes.  Pertinent labs & imaging results that were available during my care of the patient were reviewed by me and considered in my medical decision making (see chart for details).    COVID PCR test ordered. Patient to quarantine until testing results return. No alarming signs on exam. LCTAB. Symptomatic treatment discussed.  Push fluids.  Return precautions given.  Patient expresses understanding and agrees to plan.  Final Clinical Impressions(s) / UC Diagnoses   Final diagnoses:  Encounter for screening for COVID-19  Nasal congestion  Loss of taste    ED Prescriptions    Medication Sig Dispense Auth. Provider   oxymetazoline (AFRIN NASAL SPRAY) 0.05 % nasal spray Place 1 spray into both nostrils 2 (two) times daily as needed for congestion (do not use more  than 3 days in a row.). 30 mL Belinda Fisher, PA-C     PDMP not reviewed this encounter.   Belinda Fisher, PA-C 03/18/20 1624

## 2020-03-18 NOTE — ED Triage Notes (Signed)
Pt c/o nasal congestion and runny nose since Friday. States loss of taste/smell yesterday.

## 2020-03-18 NOTE — Discharge Instructions (Signed)
COVID PCR testing ordered. I would like you to quarantine until testing results. Continue flonase as directed. Add afrin as needed for 3 days. Do not use more than 3 days as it can cause rebound congestion. Steam shower, humidifier can also help with symptoms. Tylenol/motrin for pain and fever. Keep hydrated, urine should be clear to pale yellow in color. If experiencing shortness of breath, trouble breathing, go to the emergency department for further evaluation needed.

## 2020-03-19 LAB — SARS-COV-2, NAA 2 DAY TAT

## 2020-03-19 LAB — NOVEL CORONAVIRUS, NAA: SARS-CoV-2, NAA: NOT DETECTED

## 2020-03-27 ENCOUNTER — Encounter: Payer: Self-pay | Admitting: Family Medicine

## 2020-03-27 ENCOUNTER — Ambulatory Visit: Payer: BC Managed Care – PPO | Attending: Family Medicine | Admitting: Family Medicine

## 2020-03-27 ENCOUNTER — Other Ambulatory Visit: Payer: Self-pay

## 2020-03-27 VITALS — BP 138/82 | HR 76 | Ht 62.0 in | Wt 287.4 lb

## 2020-03-27 DIAGNOSIS — N926 Irregular menstruation, unspecified: Secondary | ICD-10-CM | POA: Diagnosis not present

## 2020-03-27 DIAGNOSIS — R5383 Other fatigue: Secondary | ICD-10-CM

## 2020-03-27 DIAGNOSIS — J302 Other seasonal allergic rhinitis: Secondary | ICD-10-CM | POA: Diagnosis not present

## 2020-03-27 DIAGNOSIS — L309 Dermatitis, unspecified: Secondary | ICD-10-CM

## 2020-03-27 MED ORDER — VENTOLIN HFA 108 (90 BASE) MCG/ACT IN AERS: 2.0000 | INHALATION_SPRAY | Freq: Four times a day (QID) | RESPIRATORY_TRACT | 1 refills | Status: DC | PRN

## 2020-03-27 NOTE — Progress Notes (Signed)
Having irregular periods.  States that she is tired all the time.

## 2020-03-27 NOTE — Patient Instructions (Signed)

## 2020-03-27 NOTE — Progress Notes (Signed)
Subjective:  Patient ID: Amber Arroyo, female    DOB: 1996-01-16  Age: 24 y.o. MRN: [id number]  CC: Fatigue   HPI Amber Arroyo  is a 24 year old female with a history of eczema, asthma, seasonal allergies who presents today for follow-up visit complaining of fatigue. She works two jobs and also teaches preschool, sleeps 3 hours/day. She feels drained most days and also feels dizzy when she is in a fast moving car.  Previously had 1 full-time job and the other was part-time but she recently switched to 2 full-time jobs  2 months ago she noticed her period became irregular with skiping some weeks when previously it was every 4 weeks.  We have discussed oral contraception as some method of regularizing her periods but she is not interested in this as she is trying to conceive  Seeing Dermatology for Eczema and has been on Triamcinolone which has been ineffective. Her asthma has been stable. Past Medical History:  Diagnosis Date  . Allergy    eczema, seasonal allergies  . Asthma   . Eczema   . Obesity     Past Surgical History:  Procedure Laterality Date  . INCISION AND DRAINAGE ABSCESS Left 02/04/2013   Procedure: INCISION AND DRAINAGE POSTERIOR NECK ABSCESS;  Surgeon: Flo Shanks, MD;  [facility]: [facility] OR;  Service: ENT;  Laterality: Left;  . MOUTH SURGERY     Wisdom Teeth Extraction    Family History  Problem Relation Age of Onset  . Hypertension Mother   . Asthma Mother   . Eczema Mother   . Hypertension Father     No Known Allergies  Outpatient Medications Prior to Visit  Medication Sig Dispense Refill  . cetirizine (ZYRTEC) 10 MG tablet Take 1 tablet (10 mg total) by mouth daily. 30 tablet 1  . oxymetazoline (AFRIN NASAL SPRAY) 0.05 % nasal spray Place 1 spray into both nostrils 2 (two) times daily as needed for congestion (do not use more than 3 days in a row.). 30 mL 0  . triamcinolone cream (KENALOG) 0.1 % APPLY EXTERNALLY TO THE AFFECTED AREA TWICE DAILY 80 g 0  .  VENTOLIN HFA 108 (90 Base) MCG/ACT inhaler Inhale 2 puffs into the lungs every 6 (six) hours as needed for wheezing or shortness of breath. MUST MAKE APPT FOR FURTHER REFILLS 18 g 0   No facility-administered medications prior to visit.     ROS Review of Systems  Constitutional: Positive for fatigue. Negative for activity change and appetite change.  HENT: Negative for congestion, sinus pressure and sore throat.   Eyes: Negative for visual disturbance.  Respiratory: Negative for cough, chest tightness, shortness of breath and wheezing.   Cardiovascular: Negative for chest pain and palpitations.  Gastrointestinal: Negative for abdominal distention, abdominal pain and constipation.  Endocrine: Negative for polydipsia.  Genitourinary: Negative for dysuria and frequency.  Musculoskeletal: Negative for arthralgias and back pain.  Skin: Positive for rash.  Neurological: Negative for tremors, light-headedness and numbness.  Hematological: Does not bruise/bleed easily.  Psychiatric/Behavioral: Negative for agitation and behavioral problems.    Objective:  BP 138/82   Pulse 76   Ht 5\' 2"  (1.575 m)   Wt 287 lb 6.4 oz (130.4 kg)   LMP 02/26/2020   SpO2 98%   BMI 52.57 kg/m   BP/Weight 03/27/2020 03/18/2020 01/19/2020  Systolic BP 138 142 133  Diastolic BP 82 100 83  Wt. (Lbs) 287.4 - -  BMI 52.57 - -      Physical Exam  Constitutional:      Appearance: She is well-developed.  Neck:     Vascular: No JVD.  Cardiovascular:     Rate and Rhythm: Normal rate.     Heart sounds: Normal heart sounds. No murmur heard.   Pulmonary:     Effort: Pulmonary effort is normal.     Breath sounds: Normal breath sounds. No wheezing or rales.  Chest:     Chest wall: No tenderness.  Abdominal:     General: Bowel sounds are normal. There is no distension.     Palpations: Abdomen is soft. There is no mass.     Tenderness: There is no abdominal tenderness.  Musculoskeletal:        General:  Normal range of motion.     Right lower leg: No edema.     Left lower leg: No edema.  Skin:    Findings: Rash (eczematous lesions on hands and feet) present.  Neurological:     Mental Status: She is alert and oriented to person, place, and time.  Psychiatric:        Mood and Affect: Mood normal.     CMP Latest Ref Rng & Units 05/10/2019 07/03/2017 11/03/2016  Glucose 65 - 99 mg/dL 85 86 80  BUN 6 - 20 mg/dL 12 16 14   Creatinine 0.57 - 1.00 mg/dL 6.78 9.38  Sodium 134 - 144 mmol/L 145(H) 139 142  Potassium 3.5 - 5.2 mmol/L 3.8 3.5 4.1  Chloride 96 - 106 mmol/L 106 103 103  CO2 20 - 29 mmol/L 25 27 25   Calcium 8.7 - 10.2 mg/dL 9.6 1.01) 9.4  Total Protein 6.0 - 8.5 g/dL - - 7.0  Total Bilirubin 0.0 - 1.2 mg/dL - -  Alkaline Phos 39 - 117 IU/L - - 40  AST 0 - 40 IU/L - - 15  ALT 0 - 32 IU/L - - 16    Lipid Panel  No results found for: CHOL, TRIG, HDL, CHOLHDL, VLDL, LDLCALC, LDLDIRECT  CBC    Component Value Date/Time   WBC 4.8 05/10/2019 1113   WBC 10.0 07/04/2017 0534   RBC 4.84 05/10/2019 1113   RBC 4.68 07/04/2017 0534   HGB 12.1 05/10/2019 1113   HCT 37.5 05/10/2019 1113   PLT 303 05/10/2019 1113   MCV 78 (L) 05/10/2019 1113   MCH 25.0 (L) 05/10/2019 1113   MCH 25.9 (L) 07/04/2017 0534   MCHC 32.3 05/10/2019 1113   MCHC 31.9 07/04/2017 0534   RDW 14.9 05/10/2019 1113   LYMPHSABS 1.6 05/10/2019 1113   MONOABS 0.7 07/03/2017 1849   EOSABS 0.2 05/10/2019 1113   BASOSABS 0.1 05/10/2019 1113    No results found for: HGBA1C  Assessment & Plan:  1. Seasonal allergies Stable - VENTOLIN HFA 108 (90 Base) MCG/ACT inhaler; Inhale 2 puffs into the lungs every 6 (six) hours as needed for wheezing or shortness of breath.  Dispense: 18 g; Refill: 1  2. Other fatigue Likely due to inadequate sleep Discussed this with patient We will proceed to also rule out possible underlying causes - CBC with Differential/Platelet - T4, free - TSH - VITAMIN D 25 Hydroxy  (Vit-D Deficiency, Fractures)  3. Irregular periods Could be related to stress of having two full-time jobs She is not interested in oral contraception at this time due to trying to conceive   4. Eczema, unspecified type Uncontrolled on triamcinolone Advised to discuss this with her dermatologist.    Meds ordered this encounter  Medications  .  VENTOLIN HFA 108 (90 Base) MCG/ACT inhaler    Sig: Inhale 2 puffs into the lungs every 6 (six) hours as needed for wheezing or shortness of breath.    Dispense:  18 g    Refill:  1    Follow-up: Return if symptoms worsen or fail to improve.       Hoy Register, MD, FAAFP. [facility] [place], [place] [phone]   03/27/2020, 4:06 PM

## 2020-03-28 ENCOUNTER — Other Ambulatory Visit: Payer: Self-pay | Admitting: Family Medicine

## 2020-03-28 LAB — CBC WITH DIFFERENTIAL/PLATELET
Basophils Absolute: 0 10*3/uL (ref 0.0–0.2)
Basos: 0 %
EOS (ABSOLUTE): 0.1 10*3/uL (ref 0.0–0.4)
Eos: 2 %
Hematocrit: 37.5 % (ref 34.0–46.6)
Hemoglobin: 12.1 g/dL (ref 11.1–15.9)
Immature Grans (Abs): 0 10*3/uL (ref 0.0–0.1)
Immature Granulocytes: 0 %
Lymphocytes Absolute: 2 10*3/uL (ref 0.7–3.1)
Lymphs: 29 %
MCH: 25.1 pg — ABNORMAL LOW (ref 26.6–33.0)
MCHC: 32.3 g/dL (ref 31.5–35.7)
MCV: 78 fL — ABNORMAL LOW (ref 79–97)
Monocytes Absolute: 0.6 10*3/uL (ref 0.1–0.9)
Monocytes: 8 %
Neutrophils Absolute: 4.1 10*3/uL (ref 1.4–7.0)
Neutrophils: 61 %
Platelets: 338 10*3/uL (ref 150–450)
RBC: 4.83 x10E6/uL (ref 3.77–5.28)
RDW: 15.6 % — ABNORMAL HIGH (ref 11.7–15.4)
WBC: 6.8 10*3/uL (ref 3.4–10.8)

## 2020-03-28 LAB — VITAMIN D 25 HYDROXY (VIT D DEFICIENCY, FRACTURES): Vit D, 25-Hydroxy: 14.1 ng/mL — ABNORMAL LOW (ref 30.0–100.0)

## 2020-03-28 LAB — TSH: TSH: 2.47 u[IU]/mL (ref 0.450–4.500)

## 2020-03-28 LAB — T4, FREE: Free T4: 1.19 ng/dL (ref 0.82–1.77)

## 2020-03-28 MED ORDER — ERGOCALCIFEROL 1.25 MG (50000 UT) PO CAPS: 50000.0000 [IU] | ORAL_CAPSULE | ORAL | 1 refills | Status: DC

## 2020-04-09 ENCOUNTER — Ambulatory Visit: Payer: BC Managed Care – PPO | Admitting: Podiatry

## 2020-04-22 ENCOUNTER — Other Ambulatory Visit: Payer: Self-pay | Admitting: Family Medicine

## 2020-04-22 DIAGNOSIS — J302 Other seasonal allergic rhinitis: Secondary | ICD-10-CM

## 2020-06-22 ENCOUNTER — Ambulatory Visit
Admission: EM | Admit: 2020-06-22 | Discharge: 2020-06-22 | Disposition: A | Discharge status: Home / Self Care | Payer: BC Managed Care – PPO

## 2020-06-22 ENCOUNTER — Other Ambulatory Visit: Payer: Self-pay

## 2020-06-22 DIAGNOSIS — J069 Acute upper respiratory infection, unspecified: Secondary | ICD-10-CM | POA: Diagnosis not present

## 2020-06-22 MED ORDER — BENZONATATE 100 MG PO CAPS: 100.0000 mg | ORAL_CAPSULE | Freq: Three times a day (TID) | ORAL | 0 refills | Status: DC

## 2020-06-22 MED ORDER — CETIRIZINE HCL 10 MG PO TABS: 10.0000 mg | ORAL_TABLET | Freq: Every day | ORAL | 0 refills | Status: DC

## 2020-06-22 MED ORDER — FLUTICASONE PROPIONATE 50 MCG/ACT NA SUSP: 1.0000 | Freq: Every day | NASAL | 0 refills | Status: DC

## 2020-06-22 NOTE — ED Provider Notes (Signed)
[name]    CSN: [id number] Arrival date & time: 06/22/20  1452      History   Chief Complaint Chief Complaint  Patient presents with  . Cough  . Nasal Congestion    HPI Amber Arroyo is a 25 y.o. female  History was provided by the patient. Amber Arroyo is a 25 y.o. female who presents for evaluation of symptoms of a URI. Symptoms include nasal blockage, post nasal drip, sinus and nasal congestion and sore throat. Onset of symptoms was a few days ago, unchanged since that time. Associated symptoms include no  fever.  She is drinking plenty of fluids. Evaluation to date: none. Treatment to date: none The following portions of the patient's history were reviewed and updated as appropriate: allergies, current medications, past family history, past medical history, past social history, past surgical history and problem list.     Past Medical History:  Diagnosis Date  . Allergy    eczema, seasonal allergies  . Asthma   . Eczema   . Obesity     Patient Active Problem List   Diagnosis Date Noted  . Asthma   . Cellulitis 07/03/2017  . Seasonal allergies 02/10/2016  . Eczema 01/07/2016  . Obesity 01/07/2016  . Benign essential hypertension 07/17/2013  . Abscess of neck 02/04/2013    Past Surgical History:  Procedure Laterality Date  . INCISION AND DRAINAGE ABSCESS Left 02/04/2013   Procedure: INCISION AND DRAINAGE POSTERIOR NECK ABSCESS;  Surgeon: Flo Shanks, MD;  Location: [facility] OR;  Service: ENT;  Laterality: Left;  . MOUTH SURGERY     Wisdom Teeth Extraction    OB History   No obstetric history on file.      Home Medications    Prior to Admission medications   Medication Sig Start Date End Date Taking? Authorizing Provider  benzonatate (TESSALON) 100 MG capsule Take 1 capsule (100 mg total) by mouth every 8 (eight) hours. 06/22/20  Yes Hall-Potvin, [place], PA-C  cetirizine (ZYRTEC ALLERGY) 10 MG tablet Take 1 tablet (10 mg total) by mouth daily.  06/22/20  Yes Hall-Potvin, [place], PA-C  fluticasone (FLONASE) 50 MCG/ACT nasal spray Place 1 spray into both nostrils daily. 06/22/20  Yes Hall-Potvin, [place], PA-C  betamethasone, augmented, (DIPROLENE) 0.05 % lotion SMARTSIG:Sparingly Topical Daily 02/09/20   [provider]  DUPIXENT 300 MG/2ML SOPN SMARTSIG:1 Pre-Filled Pen Syringe SUB-Q Every 2 Weeks 02/07/20   [provider]  ergocalciferol (DRISDOL) 1.25 MG (50000 UT) capsule Take 1 capsule (50,000 Units total) by mouth once a week. 03/28/20   Hoy Register, MD  oxymetazoline (AFRIN NASAL SPRAY) 0.05 % nasal spray Place 1 spray into both nostrils 2 (two) times daily as needed for congestion (do not use more than 3 days in a row.). 03/18/20   Cathie Hoops, Amy V, PA-C  triamcinolone cream (KENALOG) 0.1 % APPLY EXTERNALLY TO THE AFFECTED AREA TWICE DAILY 02/25/18   Hoy Register, MD  VENTOLIN HFA 108 (90 Base) MCG/ACT inhaler Inhale 2 puffs into the lungs every 6 (six) hours as needed for wheezing or shortness of breath. 03/27/20   Hoy Register, MD    Family History Family History  Problem Relation Age of Onset  . Hypertension Mother   . Asthma Mother   . Eczema Mother   . Hypertension Father     Social History Social History   Tobacco Use  . Smoking status: Never Smoker  . Smokeless tobacco: Never Used  Substance Use Topics  . Alcohol use: No  .  Drug use: No     Allergies   Patient has no known allergies.   Review of Systems Review of Systems  Constitutional: Negative for fatigue and fever.  HENT: Positive for congestion, postnasal drip and sore throat. Negative for dental problem, ear pain, facial swelling, hearing loss, sinus pain, trouble swallowing and voice change.   Eyes: Negative for photophobia, pain and visual disturbance.  Respiratory: Positive for cough. Negative for shortness of breath.   Cardiovascular: Negative for chest pain and palpitations.  Gastrointestinal: Negative for diarrhea and  vomiting.  Musculoskeletal: Negative for arthralgias and myalgias.  Neurological: Negative for dizziness and headaches.     Physical Exam Triage Vital Signs ED Triage Vitals  Enc Vitals Group     BP 06/22/20 1639 (!) 161/110     Pulse Rate 06/22/20 1639 80     Resp 06/22/20 1639 (!) 22     Temp 06/22/20 1639 97.7 F (36.5 C)     Temp Source 06/22/20 1556 Oral     SpO2 06/22/20 1639 98 %     Weight --      Height --      Head Circumference --      Peak Flow --      Pain Score 06/22/20 1555 0     Pain Loc --      Pain Edu? --      Excl. in [place]? --    No data found.  Updated Vital Signs BP (!) 161/110 (BP Location: Left Arm)   Pulse 80   Temp 97.7 F (36.5 C) (Oral)   Resp (!) 22   SpO2 98%   Visual Acuity Right Eye Distance:   Left Eye Distance:   Bilateral Distance:    Right Eye Near:   Left Eye Near:    Bilateral Near:     Physical Exam Constitutional:      General: She is not in acute distress.    Appearance: She is not ill-appearing or diaphoretic.  HENT:     Head: Normocephalic and atraumatic.     Right Ear: Tympanic membrane and ear canal normal.     Left Ear: Tympanic membrane and ear canal normal.     Mouth/Throat:     Mouth: Mucous membranes are moist.     Pharynx: Oropharynx is clear. No oropharyngeal exudate or posterior oropharyngeal erythema.  Eyes:     General: No scleral icterus.    Conjunctiva/sclera: Conjunctivae normal.     Pupils: Pupils are equal, round, and reactive to light.  Neck:     Comments: Trachea midline, negative JVD Cardiovascular:     Rate and Rhythm: Normal rate and regular rhythm.     Heart sounds: No murmur heard. No gallop.   Pulmonary:     Effort: Pulmonary effort is normal. No respiratory distress.     Breath sounds: No wheezing, rhonchi or rales.  Musculoskeletal:     Cervical back: Neck supple. No tenderness.  Lymphadenopathy:     Cervical: No cervical adenopathy.  Skin:    Capillary Refill: Capillary  refill takes less than 2 seconds.     Coloration: Skin is not jaundiced or pale.     Findings: No rash.  Neurological:     General: No focal deficit present.     Mental Status: She is alert and oriented to person, place, and time.      UC Treatments / Results  Labs (all labs ordered are listed, but only abnormal results are displayed) Labs  Reviewed  NOVEL CORONAVIRUS, NAA    EKG   Radiology No results found.  Procedures Procedures (including critical care time)  Medications Ordered in UC Medications - No data to display  Initial Impression / Assessment and Plan / UC Course  I have reviewed the triage vital signs and the nursing notes.  Pertinent labs & imaging results that were available during my care of the patient were reviewed by me and considered in my medical decision making (see chart for details).     Patient afebrile, nontoxic, with SpO2 98%.  Covid PCR pending.  Patient to quarantine until results are back.  We will treat supportively as outlined below.  Return precautions discussed, patient verbalized understanding and is agreeable to plan. Final Clinical Impressions(s) / UC Diagnoses   Final diagnoses:  URI with cough and congestion     Discharge Instructions     Tessalon for cough. Start flonase, atrovent nasal spray for nasal congestion/drainage. You can use over the counter nasal saline rinse such as neti pot for nasal congestion. Keep hydrated, your urine should be clear to pale yellow in color. Tylenol/motrin for fever and pain. Monitor for any worsening of symptoms, chest pain, shortness of breath, wheezing, swelling of the throat, go to the emergency department for further evaluation needed.     ED Prescriptions    Medication Sig Dispense Auth. Provider   cetirizine (ZYRTEC ALLERGY) 10 MG tablet Take 1 tablet (10 mg total) by mouth daily. 30 tablet Hall-Potvin, [place], PA-C   fluticasone (FLONASE) 50 MCG/ACT nasal spray Place 1 spray into both  nostrils daily. 16 g Hall-Potvin, [place], PA-C   benzonatate (TESSALON) 100 MG capsule Take 1 capsule (100 mg total) by mouth every 8 (eight) hours. 21 capsule Hall-Potvin, [place], PA-C     PDMP not reviewed this encounter.   Odette Fraction [place], [place] 06/22/20 1841

## 2020-06-22 NOTE — ED Triage Notes (Incomplete)
Pt is here with a cough and congestion that started 06/20/2020, pt has taken OTC meds to relieve discomfort.

## 2020-06-22 NOTE — Discharge Instructions (Signed)

## 2020-06-24 ENCOUNTER — Ambulatory Visit: Payer: Self-pay | Admitting: *Deleted

## 2020-06-24 NOTE — Telephone Encounter (Signed)
  Patient called to request appt for elevated B/P. Patient noted with upper respiratory symptoms and went to [facility] and B/P was 161/110. Patient was told to see PCP for elevated B/P. Patient denies headache , blurred vision. Patient requesting to know what OTC to take for upper respiratory issues due to elevated B/P. Instructed patient to call pharmacist and request which OTC meds safe with elevated B/P. Patient awaiting covid test results. My Chart virtual appt made for 08/14/20. No earlier virtual appt noted. Care advise given. Patient verbalized understanding of care advise and to call back or go to UC or ED if B/P remains elevated. Encouraged patient to get home monitor if possible to check B/P. Please notify patient if earlier appt available.      Reason for Disposition . Systolic BP  >= 160 OR Diastolic >= 100  Answer Assessment - Initial Assessment Questions 1. BLOOD PRESSURE: "What is the blood pressure?" "Did you take at least two measurements 5 minutes apart?"     Na  2. ONSET: "When did you take your blood pressure?"     Over the weekend  3. HOW: "How did you obtain the blood pressure?" (e.g., visiting nurse, automatic home BP monitor)     [facility]  4. HISTORY: "Do you have a history of high blood pressure?"     No mother and sisters have HTN  5. MEDICATIONS: "Are you taking any medications for blood pressure?" "Have you missed any doses recently?"     na 6. OTHER SYMPTOMS: "Do you have any symptoms?" (e.g., headache, chest pain, blurred vision, difficulty breathing, weakness)     No  7. PREGNANCY: "Is there any chance you are pregnant?" "When was your last menstrual period?"     no  Protocols used: BLOOD PRESSURE - HIGH-A-AH

## 2020-06-25 LAB — NOVEL CORONAVIRUS, NAA: SARS-CoV-2, NAA: NOT DETECTED

## 2020-06-25 NOTE — Telephone Encounter (Signed)
Pt awaiting COVID results, we have no open appointments and pt states that BP is very elevated.

## 2020-06-25 NOTE — Telephone Encounter (Signed)
Will forward to nurse 

## 2020-06-25 NOTE — Telephone Encounter (Signed)
I did see a blood pressure of 161/110 at her ED visit and prior to that her blood pressures have been normal.  It is not uncommon to see elevated blood pressures with an acute illness.  I recommend she obtain a blood pressure monitor and keep a log of her blood pressure and we can schedule her with any available clinician.  She will need to bring in her home blood pressure log for review.

## 2020-06-25 NOTE — Telephone Encounter (Signed)
Pt was called and informed via VM to obtain a BP machine and record her number.  Please schedule her with mobile unit or double book [place].

## 2020-07-16 ENCOUNTER — Other Ambulatory Visit: Payer: Self-pay

## 2020-07-16 ENCOUNTER — Ambulatory Visit: Payer: BC Managed Care – PPO | Attending: Family Medicine | Admitting: Family Medicine

## 2020-07-16 ENCOUNTER — Encounter: Payer: Self-pay | Admitting: Family Medicine

## 2020-07-16 VITALS — BP 137/91 | HR 82 | Ht 62.0 in | Wt 298.6 lb

## 2020-07-16 DIAGNOSIS — R29818 Other symptoms and signs involving the nervous system: Secondary | ICD-10-CM

## 2020-07-16 DIAGNOSIS — I10 Essential (primary) hypertension: Secondary | ICD-10-CM

## 2020-07-16 DIAGNOSIS — N926 Irregular menstruation, unspecified: Secondary | ICD-10-CM

## 2020-07-16 LAB — POCT URINE PREGNANCY: Preg Test, Ur: NEGATIVE

## 2020-07-16 MED ORDER — HYDROCHLOROTHIAZIDE 12.5 MG PO TABS: 12.5000 mg | ORAL_TABLET | Freq: Every day | ORAL | 1 refills | Status: DC

## 2020-07-16 NOTE — Patient Instructions (Signed)

## 2020-07-16 NOTE — Progress Notes (Signed)
Subjective:  Patient ID: Amber Arroyo, female    DOB: 1995-08-02  Age: 25 y.o. MRN: [id number]  CC: Hypertension   HPI Amber Arroyo is a 25 year old female with a history of Obesity, Asthma, Eczema, Seasonal Allergies here for an acute visit. She had called the office stating she had elevated blood pressures during her urgent care visit. Her BP at home was 129 systolic measured using a wrist blood pressure cuff. She works at [employer] and endorses eating a lot of fast foods. Missed her period last month; her periods have been irregular but the last couple of months they became regular. Her boyfriend states she snores at night and wakes up at night intermittently. She has daytime somnolence but no headaches or fatigue. She gets 3 hours of sleep at night due to her work schedule. Past Medical History:  Diagnosis Date  . Allergy    eczema, seasonal allergies  . Asthma   . Eczema   . Obesity     Past Surgical History:  Procedure Laterality Date  . INCISION AND DRAINAGE ABSCESS Left 02/04/2013   Procedure: INCISION AND DRAINAGE POSTERIOR NECK ABSCESS;  Surgeon: Flo Shanks, MD;  Location: [facility] OR;  Service: ENT;  Laterality: Left;  . MOUTH SURGERY     Wisdom Teeth Extraction    Family History  Problem Relation Age of Onset  . Hypertension Mother   . Asthma Mother   . Eczema Mother   . Hypertension Father     No Known Allergies  Outpatient Medications Prior to Visit  Medication Sig Dispense Refill  . cetirizine (ZYRTEC ALLERGY) 10 MG tablet Take 1 tablet (10 mg total) by mouth daily. 30 tablet 0  . ergocalciferol (DRISDOL) 1.25 MG (50000 UT) capsule Take 1 capsule (50,000 Units total) by mouth once a week. 12 capsule 1  . fluticasone (FLONASE) 50 MCG/ACT nasal spray Place 1 spray into both nostrils daily. 16 g 0  . triamcinolone cream (KENALOG) 0.1 % APPLY EXTERNALLY TO THE AFFECTED AREA TWICE DAILY 80 g 0  . VENTOLIN HFA 108 (90 Base) MCG/ACT inhaler Inhale 2 puffs into the lungs  every 6 (six) hours as needed for wheezing or shortness of breath. 18 g 1  . benzonatate (TESSALON) 100 MG capsule Take 1 capsule (100 mg total) by mouth every 8 (eight) hours. (Patient not taking: Reported on 07/16/2020) 21 capsule 0  . betamethasone, augmented, (DIPROLENE) 0.05 % lotion SMARTSIG:Sparingly Topical Daily (Patient not taking: Reported on 07/16/2020)    . DUPIXENT 300 MG/2ML SOPN SMARTSIG:1 Pre-Filled Pen Syringe SUB-Q Every 2 Weeks (Patient not taking: Reported on 07/16/2020)    . oxymetazoline (AFRIN NASAL SPRAY) 0.05 % nasal spray Place 1 spray into both nostrils 2 (two) times daily as needed for congestion (do not use more than 3 days in a row.). (Patient not taking: Reported on 07/16/2020) 30 mL 0   No facility-administered medications prior to visit.     ROS Review of Systems  Constitutional: Negative for activity change, appetite change and fatigue.  HENT: Negative for congestion, sinus pressure and sore throat.   Eyes: Negative for visual disturbance.  Respiratory: Negative for cough, chest tightness, shortness of breath and wheezing.   Cardiovascular: Negative for chest pain and palpitations.  Gastrointestinal: Negative for abdominal distention, abdominal pain and constipation.  Endocrine: Negative for polydipsia.  Genitourinary: Negative for dysuria and frequency.  Musculoskeletal: Negative for arthralgias and back pain.  Skin: Negative for rash.  Neurological: Negative for tremors, light-headedness and numbness.  Hematological:  Does not bruise/bleed easily.  Psychiatric/Behavioral: Negative for agitation and behavioral problems.    Objective:  BP (!) 137/91   Pulse 82   Ht 5\' 2"  (1.575 m)   Wt 298 lb 9.6 oz (135.4 kg)   SpO2 100%   BMI 54.61 kg/m   BP/Weight 07/16/2020 06/22/2020 03/27/2020  Systolic BP 137 161 138  Diastolic BP 91 110 82  Wt. (Lbs) 298.6 - 287.4  BMI 54.61 - 52.57      Physical Exam Constitutional:      Appearance: She is  well-developed. She is obese.  Neck:     Vascular: No JVD.  Cardiovascular:     Rate and Rhythm: Normal rate.     Heart sounds: Normal heart sounds. No murmur heard.   Pulmonary:     Effort: Pulmonary effort is normal.     Breath sounds: Normal breath sounds. No wheezing or rales.  Chest:     Chest wall: No tenderness.  Abdominal:     General: Bowel sounds are normal. There is no distension.     Palpations: Abdomen is soft. There is no mass.     Tenderness: There is no abdominal tenderness.  Musculoskeletal:        General: Normal range of motion.     Right lower leg: No edema.     Left lower leg: No edema.  Neurological:     Mental Status: She is alert and oriented to person, place, and time.  Psychiatric:        Mood and Affect: Mood normal.     CMP Latest Ref Rng & Units 05/10/2019 07/03/2017 11/03/2016  Glucose 65 - 99 mg/dL 85 86 80  BUN 6 - 20 mg/dL 12 16 14   Creatinine 0.57 - 1.00 mg/dL 11/05/2016 7.51  Sodium 134 - 144 mmol/L 145(H) 139 142  Potassium 3.5 - 5.2 mmol/L 3.8 3.5 4.1  Chloride 96 - 106 mmol/L 106 103 103  CO2 20 - 29 mmol/L 25 27 25   Calcium 8.7 - 10.2 mg/dL 9.6 0.25) 9.4  Total Protein 6.0 - 8.5 g/dL - - 7.0  Total Bilirubin 0.0 - 1.2 mg/dL - - 8.52  Alkaline Phos 39 - 117 IU/L - - 40  AST 0 - 40 IU/L - - 15  ALT 0 - 32 IU/L - - 16    Lipid Panel  No results found for: CHOL, TRIG, HDL, CHOLHDL, VLDL, LDLCALC, LDLDIRECT  CBC    Component Value Date/Time   WBC 6.8 03/27/2020 1609   WBC 10.0 07/04/2017 0534   RBC 4.83 03/27/2020 1609   RBC 4.68 07/04/2017 0534   HGB 12.1 03/27/2020 1609   HCT 37.5 03/27/2020 1609   PLT 338 03/27/2020 1609   MCV 78 (L) 03/27/2020 1609   MCH 25.1 (L) 03/27/2020 1609   MCH 25.9 (L) 07/04/2017 0534   MCHC 32.3 03/27/2020 1609   MCHC 31.9 07/04/2017 0534   RDW 15.6 (H) 03/27/2020 1609   LYMPHSABS 2.0 03/27/2020 1609   MONOABS 0.7 07/03/2017 1849   EOSABS 0.1 03/27/2020 1609   BASOSABS 0.0 03/27/2020 1609     No results found for: HGBA1C  Assessment & Plan:  1. Essential hypertension She has been eating a lot of fast food and high sodium foods at work Counseled on blood pressure goal of less than 130/80, low-sodium, DASH diet, medication compliance, 150 minutes of moderate intensity exercise per week. Discussed medication compliance, adverse effects. - Basic Metabolic Panel - hydrochlorothiazide (HYDRODIURIL) 12.5 MG tablet;  Take 1 tablet (12.5 mg total) by mouth daily.  Dispense: 90 tablet; Refill: 1  2. Suspected sleep apnea - Split night study; Future  3. Irregular periods Urine pregnancy test is negative - POCT urine pregnancy    Meds ordered this encounter  Medications  . hydrochlorothiazide (HYDRODIURIL) 12.5 MG tablet    Sig: Take 1 tablet (12.5 mg total) by mouth daily.    Dispense:  90 tablet    Refill:  1    Follow-up: Return in about 3 months (around 10/14/2020) for Chronic disease management.       Hoy Register, MD, FAAFP. [facility] [place], [place] [phone]   07/16/2020, 2:57 PM

## 2020-07-17 LAB — BASIC METABOLIC PANEL
BUN/Creatinine Ratio: 20 (ref 9–23)
BUN: 13 mg/dL (ref 6–20)
CO2: 28 mmol/L (ref 20–29)
Calcium: 10 mg/dL (ref 8.7–10.2)
Chloride: 105 mmol/L (ref 96–106)
Creatinine, Ser: 0.66 mg/dL (ref 0.57–1.00)
GFR calc Af Amer: 143 mL/min/{1.73_m2} (ref >59)
GFR calc non Af Amer: 124 mL/min/{1.73_m2} (ref >59)
Glucose: 60 mg/dL — ABNORMAL LOW (ref 65–99)
Potassium: 4.4 mmol/L (ref 3.5–5.2)
Sodium: 143 mmol/L (ref 134–144)

## 2020-07-19 ENCOUNTER — Telehealth: Payer: Self-pay

## 2020-07-19 ENCOUNTER — Telehealth: Payer: Self-pay | Admitting: Family Medicine

## 2020-07-19 DIAGNOSIS — R29818 Other symptoms and signs involving the nervous system: Secondary | ICD-10-CM

## 2020-07-19 NOTE — Telephone Encounter (Signed)
-----   Message from Hoy Register, MD sent at 07/17/2020 12:48 PM EST ----- Please inform the patient that labs are normal. Thank you.

## 2020-07-19 NOTE — Telephone Encounter (Signed)
Insurance will only cover home sleep study new order is needed.

## 2020-07-19 NOTE — Telephone Encounter (Signed)
Copied from CRM [id number]. Topic: General - Other >> Jul 18, 2020 12:42 PM Marylen Ponto wrote: Reason for CRM: Terri with [facility] reports that [employer] denied sleep study order but will approve home sleep study. Terri asked that a new order be placed in Epic if provider would like to move forward with home sleep study. Cb# [phone]

## 2020-07-19 NOTE — Telephone Encounter (Signed)
Done

## 2020-07-19 NOTE — Telephone Encounter (Signed)
Pt has viewed results via mychart. 

## 2020-07-19 NOTE — Telephone Encounter (Signed)
-----   Message from Enobong Newlin, MD sent at 07/17/2020 12:48 PM EST ----- Please inform the patient that labs are normal. Thank you. 

## 2020-08-14 ENCOUNTER — Ambulatory Visit: Payer: BC Managed Care – PPO | Admitting: Family Medicine

## 2020-08-21 DIAGNOSIS — N911 Secondary amenorrhea: Secondary | ICD-10-CM | POA: Diagnosis not present

## 2020-08-21 DIAGNOSIS — Z6841 Body Mass Index (BMI) 40.0 and over, adult: Secondary | ICD-10-CM | POA: Diagnosis not present

## 2020-09-04 DIAGNOSIS — Z124 Encounter for screening for malignant neoplasm of cervix: Secondary | ICD-10-CM | POA: Diagnosis not present

## 2020-09-04 DIAGNOSIS — Z114 Encounter for screening for human immunodeficiency virus [HIV]: Secondary | ICD-10-CM | POA: Diagnosis not present

## 2020-09-04 DIAGNOSIS — Z6841 Body Mass Index (BMI) 40.0 and over, adult: Secondary | ICD-10-CM | POA: Diagnosis not present

## 2020-09-04 DIAGNOSIS — Z01419 Encounter for gynecological examination (general) (routine) without abnormal findings: Secondary | ICD-10-CM | POA: Diagnosis not present

## 2020-09-04 DIAGNOSIS — Z01411 Encounter for gynecological examination (general) (routine) with abnormal findings: Secondary | ICD-10-CM | POA: Diagnosis not present

## 2020-09-04 DIAGNOSIS — Z118 Encounter for screening for other infectious and parasitic diseases: Secondary | ICD-10-CM | POA: Diagnosis not present

## 2020-09-04 DIAGNOSIS — Z113 Encounter for screening for infections with a predominantly sexual mode of transmission: Secondary | ICD-10-CM | POA: Diagnosis not present

## 2020-09-04 DIAGNOSIS — Z1159 Encounter for screening for other viral diseases: Secondary | ICD-10-CM | POA: Diagnosis not present

## 2020-09-24 DIAGNOSIS — Z6841 Body Mass Index (BMI) 40.0 and over, adult: Secondary | ICD-10-CM | POA: Diagnosis not present

## 2020-09-24 DIAGNOSIS — R7309 Other abnormal glucose: Secondary | ICD-10-CM | POA: Diagnosis not present

## 2020-09-24 DIAGNOSIS — J45909 Unspecified asthma, uncomplicated: Secondary | ICD-10-CM | POA: Diagnosis not present

## 2020-09-24 DIAGNOSIS — R7989 Other specified abnormal findings of blood chemistry: Secondary | ICD-10-CM | POA: Diagnosis not present

## 2020-09-24 DIAGNOSIS — N926 Irregular menstruation, unspecified: Secondary | ICD-10-CM | POA: Diagnosis not present

## 2020-10-03 ENCOUNTER — Other Ambulatory Visit: Payer: Self-pay | Admitting: Internal Medicine

## 2020-10-03 DIAGNOSIS — N926 Irregular menstruation, unspecified: Secondary | ICD-10-CM

## 2020-10-03 DIAGNOSIS — R7989 Other specified abnormal findings of blood chemistry: Secondary | ICD-10-CM

## 2020-10-14 ENCOUNTER — Ambulatory Visit: Payer: BC Managed Care – PPO | Admitting: Family Medicine

## 2020-10-27 ENCOUNTER — Other Ambulatory Visit: Payer: BC Managed Care – PPO

## 2020-10-29 DIAGNOSIS — J3089 Other allergic rhinitis: Secondary | ICD-10-CM | POA: Diagnosis not present

## 2020-10-29 DIAGNOSIS — J3081 Allergic rhinitis due to animal (cat) (dog) hair and dander: Secondary | ICD-10-CM | POA: Diagnosis not present

## 2020-10-29 DIAGNOSIS — R059 Cough, unspecified: Secondary | ICD-10-CM | POA: Diagnosis not present

## 2020-10-29 DIAGNOSIS — J301 Allergic rhinitis due to pollen: Secondary | ICD-10-CM | POA: Diagnosis not present

## 2020-11-18 ENCOUNTER — Encounter: Payer: Self-pay | Admitting: Emergency Medicine

## 2020-11-18 ENCOUNTER — Encounter (HOSPITAL_COMMUNITY): Payer: Self-pay | Admitting: Emergency Medicine

## 2020-11-18 ENCOUNTER — Other Ambulatory Visit: Payer: Self-pay

## 2020-11-18 ENCOUNTER — Encounter (HOSPITAL_COMMUNITY): Payer: BC Managed Care – PPO

## 2020-11-18 ENCOUNTER — Ambulatory Visit
Admission: EM | Admit: 2020-11-18 | Discharge: 2020-11-18 | Disposition: A | Discharge status: Home / Self Care | Payer: BC Managed Care – PPO

## 2020-11-18 ENCOUNTER — Emergency Department (HOSPITAL_COMMUNITY): Payer: BC Managed Care – PPO

## 2020-11-18 ENCOUNTER — Observation Stay (HOSPITAL_COMMUNITY)
Admission: EM | Admit: 2020-11-18 | Discharge: 2020-11-20 | Disposition: A | Discharge status: Home / Self Care | Payer: BC Managed Care – PPO | Attending: Internal Medicine | Admitting: Internal Medicine

## 2020-11-18 DIAGNOSIS — J45909 Unspecified asthma, uncomplicated: Secondary | ICD-10-CM | POA: Diagnosis present

## 2020-11-18 DIAGNOSIS — Z79899 Other long term (current) drug therapy: Secondary | ICD-10-CM | POA: Diagnosis not present

## 2020-11-18 DIAGNOSIS — L03115 Cellulitis of right lower limb: Secondary | ICD-10-CM | POA: Diagnosis not present

## 2020-11-18 DIAGNOSIS — L309 Dermatitis, unspecified: Secondary | ICD-10-CM | POA: Diagnosis not present

## 2020-11-18 DIAGNOSIS — J4520 Mild intermittent asthma, uncomplicated: Secondary | ICD-10-CM | POA: Diagnosis not present

## 2020-11-18 DIAGNOSIS — I10 Essential (primary) hypertension: Secondary | ICD-10-CM | POA: Insufficient documentation

## 2020-11-18 DIAGNOSIS — R0602 Shortness of breath: Secondary | ICD-10-CM | POA: Diagnosis not present

## 2020-11-18 DIAGNOSIS — R000 Tachycardia, unspecified: Secondary | ICD-10-CM | POA: Diagnosis not present

## 2020-11-18 DIAGNOSIS — Z20822 Contact with and (suspected) exposure to covid-19: Secondary | ICD-10-CM | POA: Insufficient documentation

## 2020-11-18 DIAGNOSIS — A419 Sepsis, unspecified organism: Secondary | ICD-10-CM

## 2020-11-18 DIAGNOSIS — E66813 Obesity, class 3: Secondary | ICD-10-CM

## 2020-11-18 HISTORY — DX: Essential (primary) hypertension: I10

## 2020-11-18 HISTORY — DX: Obesity, class 3: E66.813

## 2020-11-18 HISTORY — DX: Morbid (severe) obesity due to excess calories: E66.01

## 2020-11-18 LAB — COMPREHENSIVE METABOLIC PANEL
ALT: 14 U/L (ref 0–44)
AST: 14 U/L — ABNORMAL LOW (ref 15–41)
Albumin: 3.7 g/dL (ref 3.5–5.0)
Alkaline Phosphatase: 50 U/L (ref 38–126)
Anion gap: 6 (ref 5–15)
BUN: 14 mg/dL (ref 6–20)
CO2: 29 mmol/L (ref 22–32)
Calcium: 9 mg/dL (ref 8.9–10.3)
Chloride: 103 mmol/L (ref 98–111)
Creatinine, Ser: 0.83 mg/dL (ref 0.44–1.00)
GFR, Estimated: >60 mL/min (ref >60)
Glucose, Bld: 102 mg/dL — ABNORMAL HIGH (ref 70–99)
Potassium: 3.3 mmol/L — ABNORMAL LOW (ref 3.5–5.1)
Sodium: 138 mmol/L (ref 135–145)
Total Bilirubin: 0.3 mg/dL (ref 0.3–1.2)
Total Protein: 7.6 g/dL (ref 6.5–8.1)

## 2020-11-18 LAB — RESP PANEL BY RT-PCR (FLU A&B, COVID) ARPGX2
Influenza A by PCR: NEGATIVE
Influenza B by PCR: NEGATIVE
SARS Coronavirus 2 by RT PCR: NEGATIVE

## 2020-11-18 LAB — CBC WITH DIFFERENTIAL/PLATELET
Abs Immature Granulocytes: 0.16 10*3/uL — ABNORMAL HIGH (ref 0.00–0.07)
Basophils Absolute: 0 10*3/uL (ref 0.0–0.1)
Basophils Relative: 0 %
Eosinophils Absolute: 0 10*3/uL (ref 0.0–0.5)
Eosinophils Relative: 0 %
HCT: 37 % (ref 36.0–46.0)
Hemoglobin: 11.4 g/dL — ABNORMAL LOW (ref 12.0–15.0)
Immature Granulocytes: 1 %
Lymphocytes Relative: 11 %
Lymphs Abs: 1.6 10*3/uL (ref 0.7–4.0)
MCH: 24.6 pg — ABNORMAL LOW (ref 26.0–34.0)
MCHC: 30.8 g/dL (ref 30.0–36.0)
MCV: 79.9 fL — ABNORMAL LOW (ref 80.0–100.0)
Monocytes Absolute: 0.9 10*3/uL (ref 0.1–1.0)
Monocytes Relative: 6 %
Neutro Abs: 11.7 10*3/uL — ABNORMAL HIGH (ref 1.7–7.7)
Neutrophils Relative %: 82 %
Platelets: 298 10*3/uL (ref 150–400)
RBC: 4.63 MIL/uL (ref 3.87–5.11)
RDW: 16.5 % — ABNORMAL HIGH (ref 11.5–15.5)
WBC: 14.4 10*3/uL — ABNORMAL HIGH (ref 4.0–10.5)
nRBC: 0 % (ref 0.0–0.2)

## 2020-11-18 LAB — I-STAT BETA HCG BLOOD, ED (MC, WL, AP ONLY): I-stat hCG, quantitative: <5 m[IU]/mL (ref <5)

## 2020-11-18 LAB — LACTIC ACID, PLASMA
Lactic Acid, Venous: 1 mmol/L (ref 0.5–1.9)
Lactic Acid, Venous: 1.4 mmol/L (ref 0.5–1.9)

## 2020-11-18 MED ORDER — LACTATED RINGERS IV BOLUS (SEPSIS)
1000.0000 mL | Freq: Once | INTRAVENOUS | Status: AC
  Administered 2020-11-18: 1000 mL via INTRAVENOUS

## 2020-11-18 MED ORDER — BISACODYL 5 MG PO TBEC: 5.0000 mg | DELAYED_RELEASE_TABLET | Freq: Every day | ORAL | Status: DC | PRN

## 2020-11-18 MED ORDER — ENOXAPARIN SODIUM 60 MG/0.6ML IJ SOSY
60.0000 mg | PREFILLED_SYRINGE | INTRAMUSCULAR | Status: DC
  Administered 2020-11-18 – 2020-11-19 (×2): 60 mg via SUBCUTANEOUS
  Filled 2020-11-18 (×2): qty 0.6

## 2020-11-18 MED ORDER — LACTATED RINGERS IV BOLUS (SEPSIS)
500.0000 mL | Freq: Once | INTRAVENOUS | Status: AC
  Administered 2020-11-18: 500 mL via INTRAVENOUS

## 2020-11-18 MED ORDER — LACTATED RINGERS IV SOLN: INTRAVENOUS | Status: AC

## 2020-11-18 MED ORDER — ACETAMINOPHEN 650 MG RE SUPP: 650.0000 mg | Freq: Four times a day (QID) | RECTAL | Status: DC | PRN

## 2020-11-18 MED ORDER — LACTATED RINGERS IV SOLN: INTRAVENOUS | Status: DC

## 2020-11-18 MED ORDER — ONDANSETRON HCL 4 MG/2ML IJ SOLN: 4.0000 mg | Freq: Four times a day (QID) | INTRAMUSCULAR | Status: DC | PRN

## 2020-11-18 MED ORDER — VANCOMYCIN HCL IN DEXTROSE 1-5 GM/200ML-% IV SOLN
1000.0000 mg | Freq: Once | INTRAVENOUS | Status: AC
  Administered 2020-11-18: 1000 mg via INTRAVENOUS
  Filled 2020-11-18: qty 200

## 2020-11-18 MED ORDER — CEFAZOLIN SODIUM-DEXTROSE 2-4 GM/100ML-% IV SOLN
2.0000 g | Freq: Three times a day (TID) | INTRAVENOUS | Status: DC
  Administered 2020-11-18 – 2020-11-20 (×5): 2 g via INTRAVENOUS
  Filled 2020-11-18 (×5): qty 100

## 2020-11-18 MED ORDER — ACETAMINOPHEN 325 MG PO TABS
650.0000 mg | ORAL_TABLET | Freq: Four times a day (QID) | ORAL | Status: DC | PRN
Start: 2020-11-18 — End: 2020-11-20
  Administered 2020-11-19 (×2): 650 mg via ORAL
  Filled 2020-11-18 (×2): qty 2

## 2020-11-18 MED ORDER — ONDANSETRON HCL 4 MG PO TABS: 4.0000 mg | ORAL_TABLET | Freq: Four times a day (QID) | ORAL | Status: DC | PRN

## 2020-11-18 MED ORDER — SODIUM CHLORIDE 0.9% FLUSH
3.0000 mL | Freq: Two times a day (BID) | INTRAVENOUS | Status: DC
  Administered 2020-11-18 – 2020-11-20 (×4): 3 mL via INTRAVENOUS

## 2020-11-18 MED ORDER — SODIUM CHLORIDE 0.9 % IV SOLN
2.0000 g | Freq: Once | INTRAVENOUS | Status: AC
  Administered 2020-11-18: 2 g via INTRAVENOUS
  Filled 2020-11-18: qty 2

## 2020-11-18 MED ORDER — OXYCODONE HCL 5 MG PO TABS
5.0000 mg | ORAL_TABLET | ORAL | Status: DC | PRN
  Administered 2020-11-19: 5 mg via ORAL
  Filled 2020-11-18: qty 1

## 2020-11-18 MED ORDER — ALBUTEROL SULFATE (2.5 MG/3ML) 0.083% IN NEBU: 2.5000 mg | INHALATION_SOLUTION | RESPIRATORY_TRACT | Status: DC | PRN

## 2020-11-18 MED ORDER — POLYETHYLENE GLYCOL 3350 17 G PO PACK: 17.0000 g | PACK | Freq: Every day | ORAL | Status: DC | PRN

## 2020-11-18 NOTE — H&P (Signed)
History and Physical  Amber Arroyo RKY:[id number] DOB: 05/16/1996 DOA: 11/18/2020  PCP: Hoy Register, MD   Chief Complaint: leg pain  HPI:  25yow PMH asthma, eczema, skin infection, presented with right lower extremity pain and redness.  Admitted for sepsis secondary to right lower extremity cellulitis.  Patient was feeling well when 5/29 she developed pain in her right lower leg, developing redness and associated nausea or vomiting.  Pain exacerbated by walking with become severe.  Tried Tylenol with little relief. Reports she has had skin infections in her leg before.  She has had to be admitted for IV antibiotics in the past as well.  No injury or bite noted.  Chart review: . 06/2017 discharged [place] treated for right lower extremity nonpurulent cellulitis . 01/2013 admitted for left-sided neck abscess that developed after scratching and an eczematous lesion.  ED Course: Bolused with lactated Ringer's, treated with cefepime and vancomycin  Review of Systems:  Negative for fever, visual changes, sore throat, rash, new muscle aches, chest pain, shortness of breath, dysuria, bleeding positive for nausea and vomiting  PMH . Eczema . Asthma . Hypertension . Morbid obesity Body mass index is 49.6 kg/m. Marland Kitchen Remainder reviewed in Epic  PSH . Incision and drainage neck abscess 2014 . Wisdom teeth extraction  Family history includes: . Mother with hypertension, asthma, eczema . Remainder reviewed in Epic  Social History . Non-smoker, nondrinker  Allergies . Seafood, shellfish, tree pollen  Medications Current Outpatient Medications  Medication Instructions  . benzonatate (TESSALON) 100 mg, Oral, Every 8 hours  . betamethasone, augmented, (DIPROLENE) 0.05 % lotion SMARTSIG:Sparingly Topical Daily  . cetirizine (ZYRTEC ALLERGY) 10 mg, Oral, Daily  . DUPIXENT 300 MG/2ML SOPN   . ergocalciferol (DRISDOL) 50,000 Units, Oral, Weekly  . fluticasone (FLONASE) 50 MCG/ACT nasal  spray 1 spray, Each Nare, Daily  . hydrochlorothiazide (HYDRODIURIL) 12.5 mg, Oral, Daily  . oxymetazoline (AFRIN NASAL SPRAY) 0.05 % nasal spray 1 spray, Each Nare, 2 times daily PRN  . triamcinolone cream (KENALOG) 0.1 % APPLY EXTERNALLY TO THE AFFECTED AREA TWICE DAILY  . VENTOLIN HFA 108 (90 Base) MCG/ACT inhaler 2 puffs, Inhalation, Every 6 hours PRN    Physicial Exam   Vitals:  . Temperature 100.9, 25, 109, 146/87  Constitutional:   . Appears calm and comfortable Eyes:  . pupils and irises appear normal . Normal lids  ENMT:  . grossly normal hearing  . Lips appear normal . Tongue appears normal Neck:  . no masses . no thyromegaly Respiratory:  . CTA bilaterally, no w/r/r.  . Respiratory effort normal.  Cardiovascular:  . RRR, no m/r/g . No LE extremity edema   Abdomen:  . Soft, nontender, nondistended Musculoskeletal:  . RUE, LUE, RLE, LLE   . Grossly unremarkable Skin:  . Erythema right lower leg anterior shin, tender to palpation, warm to touch.  Foot uninvolved.  Knee uninvolved.  Not circumferential, approximately halfway around the leg. Psychiatric:  . Mental status o Mood, affect appropriate  I have personally reviewed following labs and imaging studies  Labs:   K+ 3.4, remainder CMP unremarkable  Lactic acid within normal limits  WBC 14.4  Urine pregnancy negative  COVID-negative  Blood cultures pending  Imaging studies:   Chest x-ray no acute disease  Medical tests:   EKG independently reviewed: ST no acute changes     ASSESSMENT/PLAN  Sepsis secondary to right lower extremity cellulitis nonpurulent --Empiric antibiotics, follow-up culture data -- Complete sepsis protocol including bolus of  fluids.  Lactic acid within normal limits.  Asthma -- Quiescent.  Bronchodilators.  Eczema, recurrent skin infections. -- Recommend follow-up with outpatient dermatology.  Morbid obesity Body mass index is 49.6 kg/m. --dietician  consult  DVT prophylaxis: enoxaparin Code Status: Full Family Communication: none Consults called: none    Time spent: 50 minutes  Brendia Sacks, MD  [facility] Direct contact: see [email or web address]  7PM-7AM contact night coverage as below   1. Check the care team in [facility] and look for a) attending/consulting [facility] provider listed and b) the [facility] team listed 2. Log into [email or web address] and use [place]'s universal password to access. If you do not have the password, please contact the hospital operator. 3. Locate the [facility] provider you are looking for under [facility] and page to a number that you can be directly reached. 4. If you still have difficulty reaching the provider, please page the [facility] (Director on Call) for the Hospitalists listed on amion for assistance.  Severity of Illness: The appropriate patient status for this patient is INPATIENT. Inpatient status is judged to be reasonable and necessary in order to provide the required intensity of service to ensure the patient's safety. The patient's presenting symptoms, physical exam findings, and initial radiographic and laboratory data in the context of their chronic comorbidities is felt to place them at high risk for further clinical deterioration. Furthermore, it is not anticipated that the patient will be medically stable for discharge from the hospital within 2 midnights of admission. The following factors support the patient status of inpatient.   " The patient's presenting symptoms include right leg pain and redness. " The worrisome physical exam findings include right leg erythema, tachycardia, low grade temp, tachypnea. " The initial radiographic and laboratory data are worrisome because of leukocytosis. " The chronic co-morbidities include morbid obesity, asthma, eczema.   * I certify that at the point of admission it is my clinical judgment that the patient will require inpatient hospital care spanning beyond 2  midnights from the point of admission due to high intensity of service, high risk for further deterioration and high frequency of surveillance required.*   Status is: Inpatient  11/18/2020, 5:57 PM   Principal Problem:   Sepsis (HCC) Active Problems:   Eczema   Asthma   Cellulitis of right leg   Obesity, Class III, BMI 40-49.9 (morbid obesity) (HCC)

## 2020-11-18 NOTE — Discharge Instructions (Addendum)
Follow up in ED for evaluation of right lower leg extremity redness, pain, swelling. Rule out DVT/need for IV antibiotics

## 2020-11-18 NOTE — ED Provider Notes (Signed)
[facility]-EMERGENCY DEPT Provider Note   CSN: [id number] Arrival date & time: 11/18/20  1418     History Chief Complaint  Patient presents with  . Leg Swelling    Redness    Amber Arroyo is a 25 y.o. female.  HPI 25 year old female with a history of asthma, hypertension, obesity presents to the ER with complaints of right lower extremity cellulitis.  Patient states that she has had infections like this in the past.  Patient states that it started as a small spot on her right lower leg and next expanded quite quickly over the last 24 hours.  She denies any fevers or chills.  No history of diabetes.  No chest pain, shortness of breath.  Has required IV antibiotics in the past.  No known tick bites, injuries.  Patient does not know what the source of the cellulitis is.    Past Medical History:  Diagnosis Date  . Allergy    eczema, seasonal allergies  . Asthma   . Eczema   . Hypertension   . Obesity     Patient Active Problem List   Diagnosis Date Noted  . Cellulitis of right leg 11/18/2020  . Asthma   . Cellulitis 07/03/2017  . Seasonal allergies 02/10/2016  . Eczema 01/07/2016  . Obesity 01/07/2016  . Benign essential hypertension 07/17/2013  . Abscess of neck 02/04/2013    Past Surgical History:  Procedure Laterality Date  . INCISION AND DRAINAGE ABSCESS Left 02/04/2013   Procedure: INCISION AND DRAINAGE POSTERIOR NECK ABSCESS;  Surgeon: Flo Shanks, MD;  Location: [facility] OR;  Service: ENT;  Laterality: Left;  . MOUTH SURGERY     Wisdom Teeth Extraction     OB History   No obstetric history on file.     Family History  Problem Relation Age of Onset  . Hypertension Mother   . Asthma Mother   . Eczema Mother   . Hypertension Father     Social History   Tobacco Use  . Smoking status: Never Smoker  . Smokeless tobacco: Never Used  Vaping Use  . Vaping Use: Never used  Substance Use Topics  . Alcohol use: No  . Drug use: No    Home  Medications Prior to Admission medications   Medication Sig Start Date End Date Taking? Authorizing Provider  VENTOLIN HFA 108 (90 Base) MCG/ACT inhaler Inhale 2 puffs into the lungs every 6 (six) hours as needed for wheezing or shortness of breath. 03/27/20  Yes Hoy Register, MD  benzonatate (TESSALON) 100 MG capsule Take 1 capsule (100 mg total) by mouth every 8 (eight) hours. Patient not taking: No sig reported 06/22/20   Hall-Potvin, [place], PA-C  betamethasone, augmented, (DIPROLENE) 0.05 % lotion SMARTSIG:Sparingly Topical Daily Patient not taking: Reported on 07/16/2020 02/09/20   [provider]  cetirizine (ZYRTEC ALLERGY) 10 MG tablet Take 1 tablet (10 mg total) by mouth daily. 06/22/20   Hall-Potvin, [place], PA-C  DUPIXENT 300 MG/2ML SOPN  02/07/20   [provider]  ergocalciferol (DRISDOL) 1.25 MG (50000 UT) capsule Take 1 capsule (50,000 Units total) by mouth once a week. 03/28/20   Hoy Register, MD  fluticasone (FLONASE) 50 MCG/ACT nasal spray Place 1 spray into both nostrils daily. 06/22/20   Hall-Potvin, [place], PA-C  hydrochlorothiazide (HYDRODIURIL) 12.5 MG tablet Take 1 tablet (12.5 mg total) by mouth daily. 07/16/20   Hoy Register, MD  oxymetazoline (AFRIN NASAL SPRAY) 0.05 % nasal spray Place 1 spray into both nostrils  2 (two) times daily as needed for congestion (do not use more than 3 days in a row.). Patient not taking: Reported on 07/16/2020 03/18/20   Belinda Fisher, PA-C  triamcinolone cream (KENALOG) 0.1 % APPLY EXTERNALLY TO THE AFFECTED AREA TWICE DAILY 02/25/18   Hoy Register, MD    Allergies    Fish-derived products and Shellfish-derived products  Review of Systems   Review of Systems  Constitutional: Negative for chills and fever.  HENT: Negative for ear pain and sore throat.   Eyes: Negative for pain and visual disturbance.  Respiratory: Negative for cough and shortness of breath.   Cardiovascular: Negative for chest pain and palpitations.   Gastrointestinal: Negative for abdominal pain and vomiting.  Genitourinary: Negative for dysuria and hematuria.  Musculoskeletal: Negative for arthralgias and back pain.  Skin: Positive for color change. Negative for rash.  Neurological: Negative for seizures and syncope.  All other systems reviewed and are negative.   Physical Exam Updated Vital Signs BP (!) 145/51   Pulse (!) 109   Temp (!) 100.9 F (38.3 C) (Rectal)   Resp 18   Ht 5\' 3"  (1.6 m)   Wt 127 kg   LMP 11/11/2020   SpO2 100%   BMI 49.60 kg/m   Physical Exam Vitals and nursing note reviewed.  Constitutional:      General: She is not in acute distress.    Appearance: She is well-developed. She is not diaphoretic.  HENT:     Head: Normocephalic and atraumatic.  Eyes:     Conjunctiva/sclera: Conjunctivae normal.  Cardiovascular:     Rate and Rhythm: Normal rate and regular rhythm.     Heart sounds: No murmur heard.   Pulmonary:     Effort: Pulmonary effort is normal. No respiratory distress.     Breath sounds: Normal breath sounds.  Abdominal:     Palpations: Abdomen is soft.     Tenderness: There is no abdominal tenderness.  Musculoskeletal:        General: Swelling present. No tenderness.     Cervical back: Neck supple.     Right lower leg: Edema present.     Comments: Right lower extremity with extensive cellulitis, almost circumferential around the calf.  2+ DP pulses.  Warm to the touch.  No evidence of purulence.  No popliteal tenderness.  Skin:    General: Skin is warm and dry.     Findings: Erythema and rash present.  Neurological:     General: No focal deficit present.     Mental Status: She is alert and oriented to person, place, and time.  Psychiatric:        Mood and Affect: Mood normal.        Behavior: Behavior normal.       ED Results / Procedures / Treatments   Labs (all labs ordered are listed, but only abnormal results are displayed) Labs Reviewed  CBC WITH  DIFFERENTIAL/PLATELET - Abnormal; Notable for the following components:      Result Value   WBC 14.4 (*)    Hemoglobin 11.4 (*)    MCV 79.9 (*)    MCH 24.6 (*)    RDW 16.5 (*)    Neutro Abs 11.7 (*)    Abs Immature Granulocytes 0.16 (*)    All other components within normal limits  COMPREHENSIVE METABOLIC PANEL - Abnormal; Notable for the following components:   Potassium 3.3 (*)    Glucose, Bld 102 (*)    AST 14 (*)  All other components within normal limits  RESP PANEL BY RT-PCR (FLU A&B, COVID) ARPGX2  CULTURE, BLOOD (ROUTINE X 2)  CULTURE, BLOOD (ROUTINE X 2)  LACTIC ACID, PLASMA  LACTIC ACID, PLASMA  I-STAT BETA HCG BLOOD, ED ([facility], [facility], [facility] ONLY)    EKG EKG Interpretation  Date/Time:  Monday Nov 18 2020 15:17:39 EDT Ventricular Rate:  115 PR Interval:  164 QRS Duration: 100 QT Interval:  340 QTC Calculation: 471 R Axis:   -48 Text Interpretation: Sinus tachycardia Left axis deviation RSR' in V1 or V2, probably normal variant Borderline T abnormalities, anterior leads Confirmed by Lockie Mola, Adam ([id number]) on 11/18/2020 3:26:00 PM   Radiology No results found.  Procedures Procedures   Medications Ordered in ED Medications  vancomycin (VANCOCIN) IVPB 1000 mg/200 mL premix (1,000 mg Intravenous New Bag/Given 11/18/20 1630)  lactated ringers infusion ( Intravenous New Bag/Given 11/18/20 1635)  lactated ringers bolus 1,000 mL (1,000 mLs Intravenous New Bag/Given 11/18/20 1634)    And  lactated ringers bolus 1,000 mL (1,000 mLs Intravenous New Bag/Given 11/18/20 1634)    And  lactated ringers bolus 1,000 mL (1,000 mLs Intravenous New Bag/Given 11/18/20 1634)    And  lactated ringers bolus 500 mL (has no administration in time range)  lactated ringers bolus 1,000 mL (0 mLs Intravenous Stopped 11/18/20 1621)  ceFEPIme (MAXIPIME) 2 g in sodium chloride 0.9 % 100 mL IVPB (0 g Intravenous Stopped 11/18/20 1627)    ED Course  I have reviewed the triage vital signs and the nursing  notes.  Pertinent labs & imaging results that were available during my care of the patient were reviewed by me and considered in my medical decision making (see chart for details).    MDM Rules/Calculators/A&P                         25 year old female who presents to the ER with right lower extremity cellulitis.  On arrival, she was borderline febrile of 99.4, with a rectal temperature of 100.9.  Tachycardic with a rate in the 120s.  Patient's right lower extremity with extensive cellulitis that is almost circumferential of her right lower extremity.  2+ DP pulses.  Labs and imaging ordered, reviewed and interpreted by me.  CBC with leukocytosis of 14.4, CMP largely unremarkable.  Lactic acid normal.  Negative pregnancy.  Lower extremity DVT study ordered, however presentation does not appear to be more cellulitic in nature.  Patient does not have any chest pain, shortness of breath, doubt PE at this time.  EKG consistent with sinus tach.  Patient does meet SIRS criteria, and code sepsis was initiated.  Fluids per order set, cefepime, Vanco ordered.  Discussed admission with the patient for further IV antibiotics, close monitoring given how quickly the cellulitis has spread.  She is agreeable to this.  Consulted hospitalist team for admission.  With Dr. Irene Limbo with the hospitalist team who will admit the patient for further evaluation and treatment.    Final Clinical Impression(s) / ED Diagnoses Final diagnoses:  Cellulitis of right lower extremity    Rx / DC Orders ED Discharge Orders    None       Mare Ferrari, PA-C 11/18/20 1719    Virgina Norfolk, DO 11/18/20 2154

## 2020-11-18 NOTE — ED Triage Notes (Addendum)
Patient reports vomiting, chills, and headache over the weekend Patient noticed right lower leg red, swelling and pain.  Patient states this is a recurrent issue and has needed iv antibiotics for cellulitis

## 2020-11-18 NOTE — Plan of Care (Signed)
  Problem: Pain Managment: Goal: General experience of comfort will improve Outcome: Progressing   Problem: Safety: Goal: Ability to remain free from injury will improve Outcome: Progressing   

## 2020-11-18 NOTE — Sepsis Progress Note (Signed)
Sepsis protocol is being followed by [facility]. 

## 2020-11-18 NOTE — ED Triage Notes (Signed)
Pt arrives POV with redness, warmth and pain in right leg. Patient SHOB and tachy in triage. Pt states she has no significant medical history.

## 2020-11-19 ENCOUNTER — Inpatient Hospital Stay (HOSPITAL_COMMUNITY): Payer: BC Managed Care – PPO

## 2020-11-19 DIAGNOSIS — M7989 Other specified soft tissue disorders: Secondary | ICD-10-CM | POA: Diagnosis not present

## 2020-11-19 DIAGNOSIS — L309 Dermatitis, unspecified: Secondary | ICD-10-CM | POA: Diagnosis not present

## 2020-11-19 DIAGNOSIS — A419 Sepsis, unspecified organism: Secondary | ICD-10-CM | POA: Diagnosis not present

## 2020-11-19 DIAGNOSIS — L03115 Cellulitis of right lower limb: Secondary | ICD-10-CM | POA: Diagnosis not present

## 2020-11-19 LAB — BASIC METABOLIC PANEL
Anion gap: 5 (ref 5–15)
BUN: 9 mg/dL (ref 6–20)
CO2: 27 mmol/L (ref 22–32)
Calcium: 8.4 mg/dL — ABNORMAL LOW (ref 8.9–10.3)
Chloride: 106 mmol/L (ref 98–111)
Creatinine, Ser: 0.64 mg/dL (ref 0.44–1.00)
GFR, Estimated: >60 mL/min (ref >60)
Glucose, Bld: 105 mg/dL — ABNORMAL HIGH (ref 70–99)
Potassium: 3.6 mmol/L (ref 3.5–5.1)
Sodium: 138 mmol/L (ref 135–145)

## 2020-11-19 LAB — CBC
HCT: 32.9 % — ABNORMAL LOW (ref 36.0–46.0)
Hemoglobin: 10.4 g/dL — ABNORMAL LOW (ref 12.0–15.0)
MCH: 24.8 pg — ABNORMAL LOW (ref 26.0–34.0)
MCHC: 31.6 g/dL (ref 30.0–36.0)
MCV: 78.3 fL — ABNORMAL LOW (ref 80.0–100.0)
Platelets: 202 10*3/uL (ref 150–400)
RBC: 4.2 MIL/uL (ref 3.87–5.11)
RDW: 16.4 % — ABNORMAL HIGH (ref 11.5–15.5)
WBC: 10.5 10*3/uL (ref 4.0–10.5)
nRBC: 0 % (ref 0.0–0.2)

## 2020-11-19 LAB — HIV ANTIBODY (ROUTINE TESTING W REFLEX): HIV Screen 4th Generation wRfx: NONREACTIVE

## 2020-11-19 MED ORDER — HYDROCHLOROTHIAZIDE 12.5 MG PO CAPS
12.5000 mg | ORAL_CAPSULE | Freq: Every day | ORAL | Status: DC
  Administered 2020-11-19 – 2020-11-20 (×2): 12.5 mg via ORAL
  Filled 2020-11-19 (×2): qty 1

## 2020-11-19 NOTE — Progress Notes (Signed)
Patient states she got up moving around. Rechecked BP147/86

## 2020-11-19 NOTE — Hospital Course (Signed)
25yow PMH asthma, eczema, skin infection, presented with right lower extremity pain and redness.  Admitted for sepsis secondary to right lower extremity cellulitis.  A & P  Sepsis secondary to right lower extremity cellulitis nonpurulent -- Sepsis resolved.  Right lower extremity cellulitis improving.   Asthma -- Stable.  Albuterol as needed.   Eczema, recurrent skin infections. -- Recommend follow-up with outpatient dermatology.   Morbid obesity Body mass index is 49.6 kg/m. --dietician consult  Microcytic anemia. --Probably related to menstruation.  Follow-up as an outpatient.

## 2020-11-19 NOTE — Plan of Care (Signed)
  Problem: Activity: Goal: Risk for activity intolerance will decrease Outcome: Progressing   Problem: Pain Managment: Goal: General experience of comfort will improve Outcome: Progressing   

## 2020-11-19 NOTE — Progress Notes (Signed)
Right lower extremity venous duplex has been completed. Preliminary results can be found in CV Proc through chart review.   11/19/20 8:40 AM Olen Cordial RVT

## 2020-11-19 NOTE — Progress Notes (Signed)
PROGRESS NOTE  Amber Arroyo JME:[id number] DOB: Jan 03, 1996 DOA: 11/18/2020 PCP: Hoy Register, MD  Brief History   25yow PMH asthma, eczema, skin infection, presented with right lower extremity pain and redness.  Admitted for sepsis secondary to right lower extremity cellulitis.  A & P  Sepsis secondary to right lower extremity cellulitis nonpurulent -- Sepsis resolved.  Right lower extremity cellulitis improving.   Asthma -- Stable.  Albuterol as needed.   Eczema, recurrent skin infections. -- Recommend follow-up with outpatient dermatology.   Morbid obesity Body mass index is 49.6 kg/m. --dietician consult  Microcytic anemia. --Probably related to menstruation.  Follow-up as an outpatient.     Disposition Plan:  Discussion: can likely go home tomorrow  Status is: Inpatient  Remains inpatient appropriate because:IV treatments appropriate due to intensity of illness or inability to take PO   Dispo: The patient is from: Home              Anticipated d/c is to: Home              Patient currently is not medically stable to d/c.   Difficult to place patient No  DVT prophylaxis:    Code Status: Full Code Level of care: Med-Surg Family Communication: none  Brendia Sacks, MD  [facility] Direct contact: see [email or web address] (further directions at bottom of note if needed) 7PM-7AM contact night coverage as at bottom of note 11/19/2020, 2:30 PM  LOS: 1 day   Consults:  . None    Procedures:  . None   Micro Data:  . BC NGTD   Antimicrobials:  . Cefazolin 5/30 >   Interval History/Subjective  CC: f/u cellulitis  Feels much better today  Objective   Vitals:  Vitals:   11/19/20 0843 11/19/20 1337  BP: (!) 158/95 (!) 160/109  Pulse: 89 79  Resp: 18 20  Temp: 98.3 F (36.8 C) 98.2 F (36.8 C)  SpO2: 98% 99%    Exam:  Constitutional:   . Appears calm and comfortable Respiratory:  . CTA bilaterally, no w/r/r.  . Respiratory effort normal.   Cardiovascular:  . RRR, no m/r/g . No LE extremity edema   Skin:  . RLE much decreased today.  Warmth seems to be gone.  Nontender.  No fluctuance or lesions noted. Psychiatric:  . Mental status o Mood, affect appropriate  I have personally reviewed the following:   Today's Data  . Basic metabolic panel unremarkable . Hemoglobin down to 10.4.  WBC has normalized.  Scheduled Meds: . enoxaparin (LOVENOX) injection  60 mg Subcutaneous Q24H  . hydrochlorothiazide  12.5 mg Oral Daily  . sodium chloride flush  3 mL Intravenous Q12H   Continuous Infusions: .  ceFAZolin (ANCEF) IV 2 g (11/19/20 1401)  . lactated ringers 75 mL/hr at 11/19/20 1125    Principal Problem:   Sepsis (HCC) Active Problems:   Eczema   Asthma   Cellulitis of right leg   Obesity, Class III, BMI 40-49.9 (morbid obesity) (HCC)   LOS: 1 day   How to contact the [facility] Attending or Consulting provider 7A - 7P or covering provider during after hours 7P -7A, for this patient?  1. Check the care team in [facility] and look for a) attending/consulting [facility] provider listed and b) the [facility] team listed 2. Log into [email or web address] and use [facility]'s universal password to access. If you do not have the password, please contact the hospital operator. 3. Locate the [facility] provider you are looking for under  [facility] and page to a number that you can be directly reached. 4. If you still have difficulty reaching the provider, please page the [facility] (Director on Call) for the Hospitalists listed on amion for assistance.

## 2020-11-20 DIAGNOSIS — L03115 Cellulitis of right lower limb: Secondary | ICD-10-CM | POA: Diagnosis not present

## 2020-11-20 DIAGNOSIS — A419 Sepsis, unspecified organism: Secondary | ICD-10-CM | POA: Diagnosis not present

## 2020-11-20 DIAGNOSIS — L309 Dermatitis, unspecified: Secondary | ICD-10-CM | POA: Diagnosis not present

## 2020-11-20 LAB — CBC
HCT: 33.8 % — ABNORMAL LOW (ref 36.0–46.0)
Hemoglobin: 10.6 g/dL — ABNORMAL LOW (ref 12.0–15.0)
MCH: 24.9 pg — ABNORMAL LOW (ref 26.0–34.0)
MCHC: 31.4 g/dL (ref 30.0–36.0)
MCV: 79.3 fL — ABNORMAL LOW (ref 80.0–100.0)
Platelets: 330 10*3/uL (ref 150–400)
RBC: 4.26 MIL/uL (ref 3.87–5.11)
RDW: 16.6 % — ABNORMAL HIGH (ref 11.5–15.5)
WBC: 10.4 10*3/uL (ref 4.0–10.5)
nRBC: 0 % (ref 0.0–0.2)

## 2020-11-20 MED ORDER — FLUTICASONE PROPIONATE 50 MCG/ACT NA SUSP: 1.0000 | Freq: Every day | NASAL | Status: DC | PRN

## 2020-11-20 MED ORDER — CEPHALEXIN 500 MG PO CAPS: 500.0000 mg | ORAL_CAPSULE | Freq: Four times a day (QID) | ORAL | 0 refills | Status: AC

## 2020-11-20 NOTE — Plan of Care (Signed)
  Problem: Clinical Measurements: Goal: Respiratory complications will improve Outcome: Progressing   Problem: Activity: Goal: Risk for activity intolerance will decrease Outcome: Progressing   Problem: Pain Managment: Goal: General experience of comfort will improve Outcome: Progressing   

## 2020-11-20 NOTE — Discharge Summary (Signed)
Physician Discharge Summary  Amber Arroyo WUJ:[id number] DOB: 10-Dec-1995 DOA: 11/18/2020  PCP: Hoy RegisterNewlin, Enobong, MD  Admit date: 11/18/2020 Discharge date: 11/20/2020  Admitted From: Home Disposition: Home  Recommendations for Outpatient Follow-up:  1. Follow up with PCP in 1 week  2. Follow up in ED if symptoms worsen or new appear   Home Health: No Equipment/Devices: None  Discharge Condition: Stable CODE STATUS: Full Diet recommendation: Heart healthy  Brief/Interim Summary: 25 year old female with past medical history of asthma, eczema,skin infection presented with right lower extremity pain and redness. Admitted for sepsis secondary to right lower extremity cellulitis.  During the hospitalizing, her symptoms have improved.  Cellulitis has much improved.  Sepsis has resolved.  She will be discharged home on oral Keflex.  Discharge Diagnoses:   Sepsis: Present on admission, resolved Right lower extremity cellulitis -Sepsis has resolved.  Cellulitis is much improving.  Currently on IV Ancef.  Switch to oral Keflex for 5 more days.  Discharge patient home today.  Hemodynamically stable. -Lower extremity duplex ultrasound was negative for DVT  Asthma -Currently stable.  Albuterol as needed on discharge  Eczema, recurrent skin infections -Currently stable.  Outpatient follow-up with dermatology  Morbid obesity -Outpatient follow-up  Chronic microcytic anemia -Probably from menstruation.  Hemoglobin stable.  Outpatient follow-up  Hypokalemia -Resolved.  Discharge Instructions  Discharge Instructions    Diet general   Complete by: As directed    Increase activity slowly   Complete by: As directed      Allergies as of 11/20/2020      Reactions   Fish-derived Products Shortness Of Breath, Swelling, Other (See Comments)   Lips swell   Shellfish-derived Products Shortness Of Breath, Swelling, Other (See Comments)   Lips swell   Tree Extract Other (See Comments)    Patient was told she is allergic to trees      Medication List    STOP taking these medications   Afrin Nasal Spray 0.05 % nasal spray Generic drug: oxymetazoline   benzonatate 100 MG capsule Commonly known as: TESSALON   betamethasone (augmented) 0.05 % lotion Commonly known as: DIPROLENE   Dupixent 300 MG/2ML Sopn Generic drug: Dupilumab   ergocalciferol 1.25 MG (50000 UT) capsule Commonly known as: Drisdol     TAKE these medications   cephALEXin 500 MG capsule Commonly known as: KEFLEX Take 1 capsule (500 mg total) by mouth 4 (four) times daily for 5 days.   cetirizine 10 MG tablet Commonly known as: ZyrTEC Allergy Take 1 tablet (10 mg total) by mouth daily.   fluticasone 50 MCG/ACT nasal spray Commonly known as: FLONASE Place 1-2 sprays into both nostrils daily as needed for allergies or rhinitis.   hydrochlorothiazide 12.5 MG tablet Commonly known as: HYDRODIURIL Take 1 tablet (12.5 mg total) by mouth daily.   triamcinolone cream 0.1 % Commonly known as: KENALOG APPLY EXTERNALLY TO THE AFFECTED AREA TWICE DAILY What changed: See the new instructions.   Ventolin HFA 108 (90 Base) MCG/ACT inhaler Generic drug: albuterol Inhale 2 puffs into the lungs every 6 (six) hours as needed for wheezing or shortness of breath.       Follow-up Information    Hoy RegisterNewlin, Enobong, MD. Schedule an appointment as soon as possible for a visit in 1 week(s).   Specialty: Family Medicine Contact information: [address] [place] [address] [phone]              Allergies  Allergen Reactions  . Fish-Derived Products Shortness Of Breath, Swelling  and Other (See Comments)    Lips swell  . Shellfish-Derived Products Shortness Of Breath, Swelling and Other (See Comments)    Lips swell  . Tree Extract Other (See Comments)    Patient was told she is allergic to trees    Consultations:  None   Procedures/Studies: DG Chest Portable 1 View  Result  Date: 11/18/2020 CLINICAL DATA:  Shortness of breath EXAM: PORTABLE CHEST 1 VIEW COMPARISON:  None. FINDINGS: The cardiomediastinal silhouette is the upper limits of normal in contour, likely accentuated due to technique. No pleural effusion. No pneumothorax. No acute pleuroparenchymal abnormality. Visualized abdomen is unremarkable. No acute osseous abnormality noted. IMPRESSION: No acute cardiopulmonary abnormality. Electronically Signed   By: Meda Klinefelter MD   On: 11/18/2020 17:42   VAS [place] LOWER EXTREMITY VENOUS (DVT) (ONLY [facility] & [facility])  Result Date: 11/19/2020  Lower Venous DVT Study Patient Name:  Amber Arroyo  Date of Exam:   11/19/2020 Medical Rec #: [id number]  Accession #:    [id number] Date of Birth: 03/21/1996  Patient Gender: F Patient Age:   14Y Exam Location:  [facility] Procedure:      VAS [place] LOWER EXTREMITY VENOUS (DVT) Referring Phys: [id number] MARIA A BELAYA --------------------------------------------------------------------------------  Indications: Swelling.  Risk Factors: None identified. Limitations: Body habitus and poor ultrasound/tissue interface. Comparison Study: No prior studies. Performing Technologist: Chanda Busing RVT  Examination Guidelines: A complete evaluation includes B-mode imaging, spectral Doppler, color Doppler, and power Doppler as needed of all accessible portions of each vessel. Bilateral testing is considered an integral part of a complete examination. Limited examinations for reoccurring indications may be performed as noted. The reflux portion of the exam is performed with the patient in reverse Trendelenburg.  +---------+---------------+---------+-----------+----------+--------------+ RIGHT    CompressibilityPhasicitySpontaneityPropertiesThrombus Aging +---------+---------------+---------+-----------+----------+--------------+ CFV      Full           Yes      Yes                                  +---------+---------------+---------+-----------+----------+--------------+ SFJ      Full                                                        +---------+---------------+---------+-----------+----------+--------------+ FV Prox  Full                                                        +---------+---------------+---------+-----------+----------+--------------+ FV Mid   Full                                                        +---------+---------------+---------+-----------+----------+--------------+ FV DistalFull                                                        +---------+---------------+---------+-----------+----------+--------------+  PFV      Full                                                        +---------+---------------+---------+-----------+----------+--------------+ POP      Full           Yes      Yes                                 +---------+---------------+---------+-----------+----------+--------------+ PTV      Full                                                        +---------+---------------+---------+-----------+----------+--------------+ PERO     Full                                                        +---------+---------------+---------+-----------+----------+--------------+   +----+---------------+---------+-----------+----------+--------------+ LEFTCompressibilityPhasicitySpontaneityPropertiesThrombus Aging +----+---------------+---------+-----------+----------+--------------+ CFV Full           Yes      Yes                                 +----+---------------+---------+-----------+----------+--------------+    Summary: RIGHT: - There is no evidence of deep vein thrombosis in the lower extremity. However, portions of this examination were limited- see technologist comments above.  - No cystic structure found in the popliteal fossa.  LEFT: - No evidence of common femoral vein obstruction.  *See table(s)  above for measurements and observations. Electronically signed by Waverly Ferrari MD on 11/19/2020 at 1:11:50 PM.    Final        Subjective: Patient seen and examined at bedside.  She feels much better and wants to go home today.  Right lower extremity redness and pain have much improved.  Discharge Exam: Vitals:   11/19/20 2326 11/20/20 0559  BP: 124/90 (!) 147/79  Pulse: 96 71  Resp: 18 15  Temp: 98.3 F (36.8 C) 97.7 F (36.5 C)  SpO2: 100% 100%    General: Pt is alert, awake, not in acute distress Cardiovascular: rate controlled, S1/S2 + Respiratory: bilateral decreased breath sounds at bases Abdominal: Soft, morbidly obese, NT, ND, bowel sounds + Extremities: Mild right lower extremity edema present with much improved erythema; no cyanosis    The results of significant diagnostics from this hospitalization (including imaging, microbiology, ancillary and laboratory) are listed below for reference.     Microbiology: Recent Results (from the past 240 hour(s))  Blood culture (routine x 2)     Status: None (Preliminary result)   Collection Time: 11/18/20  3:30 PM   Specimen: BLOOD RIGHT FOREARM  Result Value Ref Range Status   Specimen Description   Final    BLOOD RIGHT FOREARM Performed at [facility], [address] [place], [place] [address]    Special Requests   Final    BOTTLES  DRAWN AEROBIC AND ANAEROBIC Blood Culture results may not be optimal due to an inadequate volume of blood received in culture bottles Performed at [facility], [address], [place], [place] [address]    Culture   Final    NO GROWTH 2 DAYS Performed at [facility], [address], [place], [place] [address]    Report Status PENDING  Incomplete  Blood culture (routine x 2)     Status: None (Preliminary result)   Collection Time: 11/18/20  3:30 PM   Specimen: BLOOD  Result Value Ref Range Status   Specimen Description   Final     BLOOD SITE NOT SPECIFIED Performed at [facility], [address] [place], [place] [address]    Special Requests   Final    BOTTLES DRAWN AEROBIC AND ANAEROBIC Blood Culture results may not be optimal due to an inadequate volume of blood received in culture bottles Performed at [facility], [address], [place], [place] [address]    Culture   Final    NO GROWTH 2 DAYS Performed at [facility], [address] [place], [place] [address]    Report Status PENDING  Incomplete  Resp Panel by RT-PCR (Flu A&B, Covid) Nasopharyngeal Swab     Status: None   Collection Time: 11/18/20  3:53 PM   Specimen: Nasopharyngeal Swab; Nasopharyngeal(NP) swabs in vial transport medium  Result Value Ref Range Status   SARS Coronavirus 2 by RT PCR NEGATIVE NEGATIVE Final    Comment: (NOTE) SARS-CoV-2 target nucleic acids are NOT DETECTED.  The SARS-CoV-2 RNA is generally detectable in upper respiratory specimens during the acute phase of infection. The lowest concentration of SARS-CoV-2 viral copies this assay can detect is 138 copies/mL. A negative result does not preclude SARS-Cov-2 infection and should not be used as the sole basis for treatment or other patient management decisions. A negative result may occur with  improper specimen collection/handling, submission of specimen other than nasopharyngeal swab, presence of viral mutation(s) within the areas targeted by this assay, and inadequate number of viral copies(<138 copies/mL). A negative result must be combined with clinical observations, patient history, and epidemiological information. The expected result is Negative.  Fact Sheet for Patients:  BloggerCourse.com  Fact Sheet for Healthcare Providers:  SeriousBroker.it  This test is no t yet approved or cleared by the [place] FDA and  has been authorized for detection and/or diagnosis of SARS-CoV-2  by FDA under an Emergency Use Authorization (EUA). This EUA will remain  in effect (meaning this test can be used) for the duration of the COVID-19 declaration under Section 564(b)(1) of the Act, 21 U.S.C.section 360bbb-3(b)(1), unless the authorization is terminated  or revoked sooner.       Influenza A by PCR NEGATIVE NEGATIVE Final   Influenza B by PCR NEGATIVE NEGATIVE Final    Comment: (NOTE) The Xpert Xpress SARS-CoV-2/FLU/RSV plus assay is intended as an aid in the diagnosis of influenza from Nasopharyngeal swab specimens and should not be used as a sole basis for treatment. Nasal washings and aspirates are unacceptable for Xpert Xpress SARS-CoV-2/FLU/RSV testing.  Fact Sheet for Patients: BloggerCourse.com  Fact Sheet for Healthcare Providers: SeriousBroker.it  This test is not yet approved or cleared by the [place] FDA and has been authorized for detection and/or diagnosis of SARS-CoV-2 by FDA under an Emergency Use Authorization (EUA). This EUA will remain in effect (meaning this test can be used) for  the duration of the COVID-19 declaration under Section 564(b)(1) of the Act, 21 U.S.C. section 360bbb-3(b)(1), unless the authorization is terminated or revoked.  Performed at [facility], [address] [place], [place] [address]      Labs: BNP (last 3 results) No results for input(s): BNP in the last 8760 hours. Basic Metabolic Panel: Recent Labs  Lab 11/18/20 1530 11/19/20 0323  NA 138 138  K 3.3* 3.6  CL 103 106  CO2 29 27  GLUCOSE 102* 105*  BUN 14 9  CREATININE 0.83 0.64  CALCIUM 9.0 8.4*   Liver Function Tests: Recent Labs  Lab 11/18/20 1530  AST 14*  ALT 14  ALKPHOS 50  BILITOT 0.3  PROT 7.6  ALBUMIN 3.7   No results for input(s): LIPASE, AMYLASE in the last 168 hours. No results for input(s): AMMONIA in the last 168 hours. CBC: Recent Labs  Lab  11/18/20 1530 11/19/20 0323 11/20/20 0331  WBC 14.4* 10.5 10.4  NEUTROABS 11.7*  --   --   HGB 11.4* 10.4* 10.6*  HCT 37.0 32.9* 33.8*  MCV 79.9* 78.3* 79.3*  PLT 298 202 330   Cardiac Enzymes: No results for input(s): CKTOTAL, CKMB, CKMBINDEX, TROPONINI in the last 168 hours. BNP: Invalid input(s): POCBNP CBG: No results for input(s): GLUCAP in the last 168 hours. D-Dimer No results for input(s): DDIMER in the last 72 hours. Hgb A1c No results for input(s): HGBA1C in the last 72 hours. Lipid Profile No results for input(s): CHOL, HDL, LDLCALC, TRIG, CHOLHDL, LDLDIRECT in the last 72 hours. Thyroid function studies No results for input(s): TSH, T4TOTAL, T3FREE, THYROIDAB in the last 72 hours.  Invalid input(s): FREET3 Anemia work up No results for input(s): VITAMINB12, FOLATE, FERRITIN, TIBC, IRON, RETICCTPCT in the last 72 hours. Urinalysis    Component Value Date/Time   COLORURINE YELLOW 01/01/2016 1800   APPEARANCEUR CLEAR 01/01/2016 1800   LABSPEC >=1.030 01/06/2018 0904   PHURINE 5.5 01/06/2018 0904   GLUCOSEU NEGATIVE 01/06/2018 0904   HGBUR LARGE (A) 01/06/2018 0904   BILIRUBINUR negative 11/23/2018 1626   KETONESUR negative 11/23/2018 1626   KETONESUR NEGATIVE 01/06/2018 0904   PROTEINUR trace (A) 11/23/2018 1626   PROTEINUR NEGATIVE 01/06/2018 0904   UROBILINOGEN 0.2 11/23/2018 1626   UROBILINOGEN 0.2 01/06/2018 0904   NITRITE Negative 11/23/2018 1626   NITRITE NEGATIVE 01/06/2018 0904   LEUKOCYTESUR Large (3+) (A) 11/23/2018 1626   Sepsis Labs Invalid input(s): PROCALCITONIN,  WBC,  LACTICIDVEN Microbiology Recent Results (from the past 240 hour(s))  Blood culture (routine x 2)     Status: None (Preliminary result)   Collection Time: 11/18/20  3:30 PM   Specimen: BLOOD RIGHT FOREARM  Result Value Ref Range Status   Specimen Description   Final    BLOOD RIGHT FOREARM Performed at [facility], [address], [place], [place]  [address]    Special Requests   Final    BOTTLES DRAWN AEROBIC AND ANAEROBIC Blood Culture results may not be optimal due to an inadequate volume of blood received in culture bottles Performed at [facility], [address], [place], [place] [address]    Culture   Final    NO GROWTH 2 DAYS Performed at [facility], [address] [place], [place] [address]    Report Status PENDING  Incomplete  Blood culture (routine x 2)     Status: None (Preliminary result)   Collection Time: 11/18/20  3:30 PM  Specimen: BLOOD  Result Value Ref Range Status   Specimen Description   Final    BLOOD SITE NOT SPECIFIED Performed at [facility], [address] [place], [place] [address]    Special Requests   Final    BOTTLES DRAWN AEROBIC AND ANAEROBIC Blood Culture results may not be optimal due to an inadequate volume of blood received in culture bottles Performed at [facility], [address], [place], [place] [address]    Culture   Final    NO GROWTH 2 DAYS Performed at [facility], [address] [place], [place] [address]    Report Status PENDING  Incomplete  Resp Panel by RT-PCR (Flu A&B, Covid) Nasopharyngeal Swab     Status: None   Collection Time: 11/18/20  3:53 PM   Specimen: Nasopharyngeal Swab; Nasopharyngeal(NP) swabs in vial transport medium  Result Value Ref Range Status   SARS Coronavirus 2 by RT PCR NEGATIVE NEGATIVE Final    Comment: (NOTE) SARS-CoV-2 target nucleic acids are NOT DETECTED.  The SARS-CoV-2 RNA is generally detectable in upper respiratory specimens during the acute phase of infection. The lowest concentration of SARS-CoV-2 viral copies this assay can detect is 138 copies/mL. A negative result does not preclude SARS-Cov-2 infection and should not be used as the sole basis for treatment or other patient management decisions. A negative result may occur with  improper specimen collection/handling,  submission of specimen other than nasopharyngeal swab, presence of viral mutation(s) within the areas targeted by this assay, and inadequate number of viral copies(<138 copies/mL). A negative result must be combined with clinical observations, patient history, and epidemiological information. The expected result is Negative.  Fact Sheet for Patients:  BloggerCourse.com  Fact Sheet for Healthcare Providers:  SeriousBroker.it  This test is no t yet approved or cleared by the [place] FDA and  has been authorized for detection and/or diagnosis of SARS-CoV-2 by FDA under an Emergency Use Authorization (EUA). This EUA will remain  in effect (meaning this test can be used) for the duration of the COVID-19 declaration under Section 564(b)(1) of the Act, 21 U.S.C.section 360bbb-3(b)(1), unless the authorization is terminated  or revoked sooner.       Influenza A by PCR NEGATIVE NEGATIVE Final   Influenza B by PCR NEGATIVE NEGATIVE Final    Comment: (NOTE) The Xpert Xpress SARS-CoV-2/FLU/RSV plus assay is intended as an aid in the diagnosis of influenza from Nasopharyngeal swab specimens and should not be used as a sole basis for treatment. Nasal washings and aspirates are unacceptable for Xpert Xpress SARS-CoV-2/FLU/RSV testing.  Fact Sheet for Patients: BloggerCourse.com  Fact Sheet for Healthcare Providers: SeriousBroker.it  This test is not yet approved or cleared by the [place] FDA and has been authorized for detection and/or diagnosis of SARS-CoV-2 by FDA under an Emergency Use Authorization (EUA). This EUA will remain in effect (meaning this test can be used) for the duration of the COVID-19 declaration under Section 564(b)(1) of the Act, 21 U.S.C. section 360bbb-3(b)(1), unless the authorization is terminated or revoked.  Performed at [facility], [address], [place], [place] [address]      Time coordinating discharge: 35 minutes  SIGNED:   Glade Lloyd, MD  [facility] 11/20/2020, 10:06 AM

## 2020-11-21 ENCOUNTER — Encounter: Payer: Self-pay | Admitting: Internal Medicine

## 2020-11-21 ENCOUNTER — Other Ambulatory Visit: Payer: Self-pay

## 2020-11-21 ENCOUNTER — Telehealth: Payer: Self-pay

## 2020-11-21 ENCOUNTER — Ambulatory Visit: Payer: BC Managed Care – PPO | Admitting: Internal Medicine

## 2020-11-21 VITALS — BP 128/90 | HR 91 | Temp 97.5°F | Resp 22 | Ht 63.0 in | Wt 285.0 lb

## 2020-11-21 DIAGNOSIS — L03115 Cellulitis of right lower limb: Secondary | ICD-10-CM | POA: Diagnosis not present

## 2020-11-21 DIAGNOSIS — Z09 Encounter for follow-up examination after completed treatment for conditions other than malignant neoplasm: Secondary | ICD-10-CM | POA: Diagnosis not present

## 2020-11-21 NOTE — Progress Notes (Signed)
Hospital follow KO:[id number] Has not started Keflex.  Has not had a chance to p/u since d/c on yesterday.

## 2020-11-21 NOTE — Progress Notes (Signed)
  Subjective:    Amber Arroyo - 25 y.o. female MRN [id number]  Date of birth: Dec 17, 1995  HPI  Amber Arroyo is here for hospital followup. Was admitted on 5/30-6/1 for sepsis secondary to right LE cellulitis. She was treated with IV Ancef while hospitalized. Lower extremity doppler neg DVT. Discharged with PO Keflex to complete 5 day course.      Health Maintenance:  Health Maintenance Due  Topic Date Due  . COVID-19 Vaccine (1) Never done  . HPV VACCINES (1 - Risk 3-dose series) Never done  . Hepatitis C Screening  Never done  . TETANUS/TDAP  Never done  . PAP-Cervical Cytology Screening  02/25/2020  . PAP SMEAR-Modifier  02/25/2020    -  reports that she has never smoked. She has never used smokeless tobacco. - Review of Systems: Per HPI. - Past Medical History: Patient Active Problem List   Diagnosis Date Noted  . Cellulitis of right leg 11/18/2020  . Sepsis (HCC) 11/18/2020  . Obesity, Class III, BMI 40-49.9 (morbid obesity) (HCC) 11/18/2020  . Asthma   . Cellulitis 07/03/2017  . Seasonal allergies 02/10/2016  . Eczema 01/07/2016  . Obesity 01/07/2016  . Benign essential hypertension 07/17/2013  . Abscess of neck 02/04/2013   - Medications: reviewed and updated   Objective:   Physical Exam BP 128/90   Pulse 91   Temp (!) 97.5 F (36.4 C)   Resp (!) 22   Ht 5\' 3"  (1.6 m)   Wt 285 lb (129.3 kg)   LMP 11/11/2020   SpO2 97%   BMI 50.49 kg/m  Physical Exam Constitutional:      General: She is not in acute distress.    Appearance: She is not diaphoretic.  Cardiovascular:     Rate and Rhythm: Normal rate.  Pulmonary:     Effort: Pulmonary effort is normal. No respiratory distress.  Musculoskeletal:        General: Normal range of motion.  Skin:    General: Skin is warm and dry.     Comments: Right foot and ankle with edema and mild erythema. No increased warmth. No drainage present.   Neurological:     Mental Status: She is alert and oriented to person, place,  and time.  Psychiatric:        Mood and Affect: Affect normal.        Judgment: Judgment normal.            Assessment & Plan:   1. Hospital discharge follow-up Reviewed hospital course, current medications, ensured proper f/u in place, and addressed concerns.   2. Cellulitis of right lower extremity Discussed importance of continuing outpatient abx and asked patient to pick up immediately after this visit. Encouraged ice and elevation. Discussed reasons to return to ED and signs of outpatient antibiotic failure. Work note provided through 6/6 to return 6/7.    8/7, D.O. 11/21/2020, 9:09 AM Primary Care at [facility]

## 2020-11-21 NOTE — Telephone Encounter (Signed)
Transition Care Management Follow-up Telephone Call  Date of discharge and from where: 11/20/2020, [facility]  Patient was seen by Dr Earlene Plater today for hospital follow up appointment.

## 2020-11-23 LAB — CULTURE, BLOOD (ROUTINE X 2)
Culture: NO GROWTH
Culture: NO GROWTH

## 2020-11-25 ENCOUNTER — Encounter: Payer: Self-pay | Admitting: Emergency Medicine

## 2020-11-25 ENCOUNTER — Ambulatory Visit
Admission: EM | Admit: 2020-11-25 | Discharge: 2020-11-25 | Disposition: A | Discharge status: Home / Self Care | Payer: BC Managed Care – PPO | Attending: Emergency Medicine | Admitting: Emergency Medicine

## 2020-11-25 ENCOUNTER — Other Ambulatory Visit: Payer: Self-pay

## 2020-11-25 DIAGNOSIS — M7989 Other specified soft tissue disorders: Secondary | ICD-10-CM | POA: Diagnosis not present

## 2020-11-25 MED ORDER — FUROSEMIDE 40 MG PO TABS: 40.0000 mg | ORAL_TABLET | Freq: Every day | ORAL | 0 refills | Status: DC

## 2020-11-25 MED ORDER — NAPROXEN 500 MG PO TABS: 500.0000 mg | ORAL_TABLET | Freq: Two times a day (BID) | ORAL | 0 refills | Status: DC

## 2020-11-25 NOTE — ED Triage Notes (Signed)
Pt here for right leg swelling after recently being treated for cellulitis; pt sts wound is improved but swelling still present

## 2020-11-25 NOTE — ED Provider Notes (Signed)
[name]    CSN: [id number] Arrival date & time: 11/25/20  0907      History   Chief Complaint Chief Complaint  Patient presents with  . Leg Swelling    HPI Amber Arroyo is a 25 y.o. female history of hypertension, eczema, presenting today for evaluation of right foot swelling.  Was recently briefly admitted on 11/18/2020 for right lower extremity cellulitis.  Discharged with Keflex orally.  Reports overall cellulitis redness and pain have been improving, but reports that she has continued to have right leg swelling.  Denies any worsening swelling.  Had ultrasound while admitted which was negative for DVT.  Denies history of DVT or PE.  HPI  Past Medical History:  Diagnosis Date  . Allergy    eczema, seasonal allergies  . Asthma   . Eczema   . Essential hypertension   . Obesity, Class III, BMI 40-49.9 (morbid obesity) (HCC) 11/18/2020    Patient Active Problem List   Diagnosis Date Noted  . Cellulitis of right leg 11/18/2020  . Sepsis (HCC) 11/18/2020  . Obesity, Class III, BMI 40-49.9 (morbid obesity) (HCC) 11/18/2020  . Asthma   . Cellulitis 07/03/2017  . Seasonal allergies 02/10/2016  . Eczema 01/07/2016  . Obesity 01/07/2016  . Benign essential hypertension 07/17/2013  . Abscess of neck 02/04/2013    Past Surgical History:  Procedure Laterality Date  . INCISION AND DRAINAGE ABSCESS Left 02/04/2013   Procedure: INCISION AND DRAINAGE POSTERIOR NECK ABSCESS;  Surgeon: Flo Shanks, MD;  Location: [facility];  Service: ENT;  Laterality: Left;  . MOUTH SURGERY     Wisdom Teeth Extraction    OB History   No obstetric history on file.      Home Medications    Prior to Admission medications   Medication Sig Start Date End Date Taking? Authorizing Provider  furosemide (LASIX) 40 MG tablet Take 1 tablet (40 mg total) by mouth daily for 5 days. 11/25/20 11/30/20 Yes Bryar Dahms C, PA-C  naproxen (NAPROSYN) 500 MG tablet Take 1 tablet (500 mg total) by  mouth 2 (two) times daily. 11/25/20  Yes Jolan Mealor C, PA-C  cephALEXin (KEFLEX) 500 MG capsule Take 1 capsule (500 mg total) by mouth 4 (four) times daily for 5 days. Patient not taking: Reported on 11/21/2020 11/20/20 11/25/20  Glade Lloyd, MD  cetirizine (ZYRTEC ALLERGY) 10 MG tablet Take 1 tablet (10 mg total) by mouth daily. 06/22/20   Hall-Potvin, [place], PA-C  fluticasone (FLONASE) 50 MCG/ACT nasal spray Place 1-2 sprays into both nostrils daily as needed for allergies or rhinitis. 11/20/20   Glade Lloyd, MD  hydrochlorothiazide (HYDRODIURIL) 12.5 MG tablet Take 1 tablet (12.5 mg total) by mouth daily. 07/16/20   Hoy Register, MD  triamcinolone cream (KENALOG) 0.1 % APPLY EXTERNALLY TO THE AFFECTED AREA TWICE DAILY Patient taking differently: Apply 1 application topically See admin instructions. Apply to affected areas 2 times a day 02/25/18   Hoy Register, MD  VENTOLIN HFA 108 (90 Base) MCG/ACT inhaler Inhale 2 puffs into the lungs every 6 (six) hours as needed for wheezing or shortness of breath. 03/27/20   Hoy Register, MD    Family History Family History  Problem Relation Age of Onset  . Hypertension Mother   . Asthma Mother   . Eczema Mother   . Hypertension Father     Social History Social History   Tobacco Use  . Smoking status: Never Smoker  . Smokeless tobacco: Never Used  Vaping Use  .  Vaping Use: Never used  Substance Use Topics  . Alcohol use: No  . Drug use: No     Allergies   Fish-derived products, Shellfish-derived products, and Tree extract   Review of Systems Review of Systems  Constitutional: Negative for fatigue and fever.  Eyes: Negative for visual disturbance.  Respiratory: Negative for shortness of breath.   Cardiovascular: Positive for leg swelling. Negative for chest pain.  Gastrointestinal: Negative for abdominal pain, nausea and vomiting.  Musculoskeletal: Negative for arthralgias and joint swelling.  Skin: Negative for color  change, rash and wound.  Neurological: Negative for dizziness, weakness, light-headedness and headaches.     Physical Exam Triage Vital Signs ED Triage Vitals  Enc Vitals Group     BP      Pulse      Resp      Temp      Temp src      SpO2      Weight      Height      Head Circumference      Peak Flow      Pain Score      Pain Loc      Pain Edu?      Excl. in GC?    No data found.  Updated Vital Signs BP (!) 160/91 (BP Location: Left Arm)   Pulse 90   Temp 97.8 F (36.6 C) (Oral)   Resp 18   LMP 11/11/2020   SpO2 99%   Visual Acuity Right Eye Distance:   Left Eye Distance:   Bilateral Distance:    Right Eye Near:   Left Eye Near:    Bilateral Near:     Physical Exam Vitals and nursing note reviewed.  Constitutional:      Appearance: She is well-developed.     Comments: No acute distress  HENT:     Head: Normocephalic and atraumatic.     Nose: Nose normal.  Eyes:     Conjunctiva/sclera: Conjunctivae normal.  Cardiovascular:     Rate and Rhythm: Normal rate.  Pulmonary:     Effort: Pulmonary effort is normal. No respiratory distress.  Abdominal:     General: There is no distension.  Musculoskeletal:        General: Normal range of motion.     Cervical back: Neck supple.     Comments: Right lower leg and foot with 2+ pitting edema, lower leg appears hyper pigmented, but no significant erythema, minimal tenderness to palpation throughout calf and foot, dorsalis pedis 2+  Skin:    General: Skin is warm and dry.  Neurological:     Mental Status: She is alert and oriented to person, place, and time.      UC Treatments / Results  Labs (all labs ordered are listed, but only abnormal results are displayed) Labs Reviewed - No data to display  EKG   Radiology No results found.  Procedures Procedures (including critical care time)  Medications Ordered in UC Medications - No data to display  Initial Impression / Assessment and Plan / UC Course   I have reviewed the triage vital signs and the nursing notes.  Pertinent labs & imaging results that were available during my care of the patient were reviewed by me and considered in my medical decision making (see chart for details).     Continued right lower leg swelling after recent cellulitis, cellulitis appears to be improving, had previous ultrasound negative for DVT, patient declines any worsening of  symptoms since.  Will defer further ultrasounds for now with close monitoring.  Placing on Lasix for 3 to 5 days, applied Ace wraps from foot to knee and discussed using compression stockings as alternative to further help with swelling, anti-inflammatories as needed elevate leg.  Patient to follow-up in 3 to 4 days if not seeing any improvement, or seeing any worsening of symptoms, may consider repeat ultrasound.  Discussed strict return precautions. Patient verbalized understanding and is agreeable with plan.  Final Clinical Impressions(s) / UC Diagnoses   Final diagnoses:  Right leg swelling     Discharge Instructions     Continue to complete course of antibiotics Elevate leg Lasix daily over the next 3 to 5 days Naprosyn as needed for pain Wear compression stockings or Ace bandages to help with swelling Please return if developing increased swelling redness or increased pain to lower leg    ED Prescriptions    Medication Sig Dispense Auth. Provider   furosemide (LASIX) 40 MG tablet Take 1 tablet (40 mg total) by mouth daily for 5 days. 5 tablet Angell Honse C, PA-C   naproxen (NAPROSYN) 500 MG tablet Take 1 tablet (500 mg total) by mouth 2 (two) times daily. 30 tablet Nyjah Schwake, [place] C, PA-C     PDMP not reviewed this encounter.   Sharyon Cable Lytle C, PA-C 11/25/20 1012

## 2020-11-25 NOTE — Discharge Instructions (Addendum)
Continue to complete course of antibiotics Elevate leg Lasix daily over the next 3 to 5 days Naprosyn as needed for pain Wear compression stockings or Ace bandages to help with swelling Please return if developing increased swelling redness or increased pain to lower leg

## 2020-11-29 DIAGNOSIS — J301 Allergic rhinitis due to pollen: Secondary | ICD-10-CM | POA: Diagnosis not present

## 2020-12-02 DIAGNOSIS — J3089 Other allergic rhinitis: Secondary | ICD-10-CM | POA: Diagnosis not present

## 2020-12-02 DIAGNOSIS — J3081 Allergic rhinitis due to animal (cat) (dog) hair and dander: Secondary | ICD-10-CM | POA: Diagnosis not present

## 2020-12-12 ENCOUNTER — Encounter: Payer: Self-pay | Admitting: Podiatry

## 2020-12-12 ENCOUNTER — Ambulatory Visit: Payer: BC Managed Care – PPO | Admitting: Podiatry

## 2020-12-12 ENCOUNTER — Other Ambulatory Visit: Payer: Self-pay

## 2020-12-12 ENCOUNTER — Ambulatory Visit: Payer: BC Managed Care – PPO

## 2020-12-12 DIAGNOSIS — I809 Phlebitis and thrombophlebitis of unspecified site: Secondary | ICD-10-CM | POA: Diagnosis not present

## 2020-12-12 DIAGNOSIS — M7989 Other specified soft tissue disorders: Secondary | ICD-10-CM

## 2020-12-12 DIAGNOSIS — I890 Lymphedema, not elsewhere classified: Secondary | ICD-10-CM

## 2020-12-12 DIAGNOSIS — Z872 Personal history of diseases of the skin and subcutaneous tissue: Secondary | ICD-10-CM | POA: Diagnosis not present

## 2020-12-12 MED ORDER — METHYLPREDNISOLONE 4 MG PO TBPK: ORAL_TABLET | ORAL | 0 refills | Status: DC

## 2020-12-16 ENCOUNTER — Encounter: Payer: Self-pay | Admitting: Podiatry

## 2020-12-16 NOTE — Progress Notes (Signed)
  Subjective:  Patient ID: Amber Arroyo, female    DOB: 03-May-1996,  MRN: [id number]  Chief Complaint  Patient presents with   Foot Swelling    PT states she has swelling in her right foot pt states after her cellulitis infection cleared she begin have foot swelling     25 y.o. female presents with the above complaint. History confirmed with patient.  She was diagnosed with cellulitis on June 6 and has had persistent swelling and discomfort.  Objective:  Physical Exam: warm, good capillary refill, no trophic changes or ulcerative lesions, normal DP and PT pulses, and normal sensory exam.  She has significant lymph edema of both legs.  Generalized tenderness throughout both legs and ankles.  No instability, good range of motion.   X-ray right foot no acute osseous abnormalities Assessment:   1. Phlebitis   2. History of cellulitis   3. Lymphedema      Plan:  Patient was evaluated and treated and all questions answered.  I think this is likely tenderness and phlebitis secondary to prior cellulitis and lymphedema.  Recommended compression stockings as tolerated.  Warm compresses.  Should resolve on its own with time.  I did prescribe her methylprednisolone taper to alleviate pain and inflammation  Return if symptoms worsen or fail to improve.

## 2021-01-03 ENCOUNTER — Ambulatory Visit: Payer: BC Managed Care – PPO | Attending: Nurse Practitioner | Admitting: Nurse Practitioner

## 2021-01-03 ENCOUNTER — Encounter: Payer: Self-pay | Admitting: Nurse Practitioner

## 2021-01-03 ENCOUNTER — Other Ambulatory Visit: Payer: Self-pay

## 2021-01-03 VITALS — BP 118/72 | HR 78 | Ht 63.0 in | Wt 284.2 lb

## 2021-01-03 DIAGNOSIS — H539 Unspecified visual disturbance: Secondary | ICD-10-CM | POA: Diagnosis not present

## 2021-01-03 DIAGNOSIS — I10 Essential (primary) hypertension: Secondary | ICD-10-CM

## 2021-01-03 DIAGNOSIS — E669 Obesity, unspecified: Secondary | ICD-10-CM | POA: Diagnosis not present

## 2021-01-03 DIAGNOSIS — M25471 Effusion, right ankle: Secondary | ICD-10-CM

## 2021-01-03 MED ORDER — FLUTICASONE PROPIONATE 50 MCG/ACT NA SUSP: 1.0000 | Freq: Every day | NASAL | Status: DC | PRN

## 2021-01-03 MED ORDER — HYDROCHLOROTHIAZIDE 12.5 MG PO TABS: 12.5000 mg | ORAL_TABLET | Freq: Every day | ORAL | 1 refills | Status: DC

## 2021-01-03 NOTE — Progress Notes (Deleted)
Established Patient Office Visit  Subjective:  Patient ID: Amber Arroyo, female    DOB: [birth year]  Age: 25 y.o. MRN: [id number]  CC:  Chief Complaint  Patient presents with   Hypertension    HPI Amber Arroyo presents for ***  Past Medical History:  Diagnosis Date   Allergy    eczema, seasonal allergies   Asthma    Eczema    Essential hypertension    Obesity, Class III, BMI 40-49.9 (morbid obesity) ([place]) 11/18/2020    Past Surgical History:  Procedure Laterality Date   INCISION AND DRAINAGE ABSCESS Left 02/04/2013   Procedure: INCISION AND DRAINAGE POSTERIOR NECK ABSCESS;  Surgeon: Jodi Marble, MD;  Location: [facility] OR;  Service: ENT;  Laterality: Left;   MOUTH SURGERY     Wisdom Teeth Extraction    Family History  Problem Relation Age of Onset   Hypertension Mother    Asthma Mother    Eczema Mother    Hypertension Father     Social History   Socioeconomic History   Marital status: Single    Spouse name: Not on file   Number of children: Not on file   Years of education: Not on file   Highest education level: Not on file  Occupational History   Not on file  Tobacco Use   Smoking status: Never   Smokeless tobacco: Never  Vaping Use   Vaping Use: Never used  Substance and Sexual Activity   Alcohol use: No   Drug use: No   Sexual activity: Yes    Birth control/protection: Condom, None  Other Topics Concern   Not on file  Social History Narrative   Lives with boyfriend.  No pets.   Social Determinants of Health   Financial Resource Strain: Not on file  Food Insecurity: Not on file  Transportation Needs: Not on file  Physical Activity: Not on file  Stress: Not on file  Social Connections: Not on file  Intimate Partner Violence: Not on file    Outpatient Medications Prior to Visit  Medication Sig Dispense Refill   cetirizine (ZYRTEC ALLERGY) 10 MG tablet Take 1 tablet (10 mg total) by mouth daily. 30 tablet 0   methylPREDNISolone (MEDROL DOSEPAK) 4 MG  TBPK tablet 6 day dose pack - take as directed 21 tablet 0   naproxen (NAPROSYN) 500 MG tablet Take 1 tablet (500 mg total) by mouth 2 (two) times daily. 30 tablet 0   triamcinolone cream (KENALOG) 0.1 % APPLY EXTERNALLY TO THE AFFECTED AREA TWICE DAILY (Patient taking differently: Apply 1 application topically See admin instructions. Apply to affected areas 2 times a day) 80 g 0   VENTOLIN HFA 108 (90 Base) MCG/ACT inhaler Inhale 2 puffs into the lungs every 6 (six) hours as needed for wheezing or shortness of breath. 18 g 1   fluticasone (FLONASE) 50 MCG/ACT nasal spray Place 1-2 sprays into both nostrils daily as needed for allergies or rhinitis.     hydrochlorothiazide (HYDRODIURIL) 12.5 MG tablet Take 1 tablet (12.5 mg total) by mouth daily. 90 tablet 1   furosemide (LASIX) 40 MG tablet Take 1 tablet (40 mg total) by mouth daily for 5 days. 5 tablet 0   No facility-administered medications prior to visit.    Allergies  Allergen Reactions   Fish-Derived Products Shortness Of Breath, Swelling and Other (See Comments)    Lips swell   Shellfish-Derived Products Shortness Of Breath, Swelling and Other (See Comments)    Lips swell  Tree Extract Other (See Comments)    Patient was told she is allergic to trees    ROS Review of Systems    Objective:    Physical Exam  BP 118/72   Pulse 78   Ht 5' 3"  (1.6 m)   Wt 284 lb 3.2 oz (128.9 kg)   SpO2 97%   BMI 50.34 kg/m  Wt Readings from Last 3 Encounters:  01/03/21 284 lb 3.2 oz (128.9 kg)  11/21/20 285 lb (129.3 kg)  11/18/20 280 lb (127 kg)     Health Maintenance Due  Topic Date Due   COVID-19 Vaccine (1) Never done   HPV VACCINES (1 - 2-dose series) Never done   Hepatitis C Screening  Never done   TETANUS/TDAP  Never done   PAP-Cervical Cytology Screening  02/25/2020   PAP SMEAR-Modifier  02/25/2020       Topic Date Due   HPV VACCINES (1 - 2-dose series) Never done    Lab Results  Component Value Date   TSH  2.470 03/27/2020   Lab Results  Component Value Date   WBC 10.4 11/20/2020   HGB 10.6 (L) 11/20/2020   HCT 33.8 (L) 11/20/2020   MCV 79.3 (L) 11/20/2020   PLT 330 11/20/2020   Lab Results  Component Value Date   NA 138 11/19/2020   K 3.6 11/19/2020   CO2 27 11/19/2020   GLUCOSE 105 (H) 11/19/2020   BUN 9 11/19/2020   CREATININE 0.64 11/19/2020   BILITOT 0.3 11/18/2020   ALKPHOS 50 11/18/2020   AST 14 (L) 11/18/2020   ALT 14 11/18/2020   PROT 7.6 11/18/2020   ALBUMIN 3.7 11/18/2020   CALCIUM 8.4 (L) 11/19/2020   ANIONGAP 5 11/19/2020   No results found for: CHOL No results found for: HDL No results found for: LDLCALC No results found for: TRIG No results found for: CHOLHDL No results found for: HGBA1C    Assessment & Plan:   Problem List Items Addressed This Visit       Cardiovascular and Mediastinum   Benign essential hypertension - Primary   Relevant Medications   hydrochlorothiazide (HYDRODIURIL) 12.5 MG tablet     Other   Obesity   Relevant Orders   CBC with Differential   Lipid Panel   Vitamin D, 25-hydroxy   Change in vision   Relevant Orders   CBC with Differential   CMP14+EGFR   TSH   T4, Free   Other Visit Diagnoses     Edema of right ankle       Relevant Orders   CMP14+EGFR   Essential hypertension       Relevant Medications   hydrochlorothiazide (HYDRODIURIL) 12.5 MG tablet       Meds ordered this encounter  Medications   hydrochlorothiazide (HYDRODIURIL) 12.5 MG tablet    Sig: Take 1 tablet (12.5 mg total) by mouth daily.    Dispense:  90 tablet    Refill:  1   fluticasone (FLONASE) 50 MCG/ACT nasal spray    Sig: Place 1-2 sprays into both nostrils daily as needed for allergies or rhinitis.    Follow-up: Return in about 2 months (around 03/06/2021).    Feliberto Gottron, FNP

## 2021-01-03 NOTE — Patient Instructions (Addendum)
#  Take medications as prescribed #Exercises (at least 30 minutes per day x5 days/week) #Decrease salt and simple sugars intake #Schedule for eye exam #Eat plant base diet #Use compression socks during the day and off at night. #Elevate lower extremities while sitting #Labs today.  #Follow up in 2 months with Dr. Alvis Lemmings.

## 2021-01-03 NOTE — Progress Notes (Signed)
Has swelling in feet. Blurred vision in the morning.

## 2021-01-03 NOTE — Progress Notes (Signed)
Amber Arroyo, is a 25 y.o. female  IRW:[id number]  PYP:[id number]  DOB - [birth year]  Subjective:  Chief Complaint and HPI: Amber Arroyo is a 25 y.o. female known to this practice who presents to the clinic this afternoon with complaint of swelling to right ankle, intermittent blurry vision and recheck her blood pressure. Patient visited the local ED with swelling to her lower extremities (mostly right side) and was diagnosed with cellulitis. Seen by Podiatry on 12/12/20 for right foot swelling and was prescribed compression socks. Patient says that she is now experiencing blurry vision, mostly in the morning for the past few months. Has a family history of DMII (mother) and her last exam was more than 12 months ago. Her BP is stable today (118/72) and requests medication refills.   ED/Hospital notes reviewed.   Social History:reviewed Family history: reviewed  ROS:   Constitutional:  No f/c, No night sweats, No unexplained weight loss. EENT:  Reports intermittent blurry vision, No hearing changes. No mouth, throat, or ear problems.  Respiratory: No cough, No SOB Cardiac: No CP, no palpitations Neuro: No headache, no dizziness, no motor weakness.  MS: reports swelling to right ankle. Skin: No rash Endocrine:  No polydipsia. No polyuria.    Problem  Change in Vision    ALLERGIES: Allergies  Allergen Reactions   Fish-Derived Products Shortness Of Breath, Swelling and Other (See Comments)    Lips swell   Shellfish-Derived Products Shortness Of Breath, Swelling and Other (See Comments)    Lips swell   Tree Extract Other (See Comments)    Patient was told she is allergic to trees    PAST MEDICAL HISTORY: Past Medical History:  Diagnosis Date   Allergy    eczema, seasonal allergies   Asthma    Eczema    Essential hypertension    Obesity, Class III, BMI 40-49.9 (morbid obesity) ([place]) 11/18/2020    MEDICATIONS AT HOME: Prior to Admission medications   Medication Sig Start Date  End Date Taking? Authorizing Provider  cetirizine (ZYRTEC ALLERGY) 10 MG tablet Take 1 tablet (10 mg total) by mouth daily. 06/22/20  Yes Hall-Potvin, [place], PA-C  methylPREDNISolone (MEDROL DOSEPAK) 4 MG TBPK tablet 6 day dose pack - take as directed 12/12/20  Yes McDonald, Adam R, DPM  naproxen (NAPROSYN) 500 MG tablet Take 1 tablet (500 mg total) by mouth 2 (two) times daily. 11/25/20  Yes Wieters, Hallie C, PA-C  triamcinolone cream (KENALOG) 0.1 % APPLY EXTERNALLY TO THE AFFECTED AREA TWICE DAILY Patient taking differently: Apply 1 application topically See admin instructions. Apply to affected areas 2 times a day 02/25/18  Yes Newlin, [place], MD  VENTOLIN HFA 108 (90 Base) MCG/ACT inhaler Inhale 2 puffs into the lungs every 6 (six) hours as needed for wheezing or shortness of breath. 03/27/20  Yes Charlott Rakes, MD  fluticasone (FLONASE) 50 MCG/ACT nasal spray Place 1-2 sprays into both nostrils daily as needed for allergies or rhinitis. 01/03/21   Feliberto Gottron, FNP  furosemide (LASIX) 40 MG tablet Take 1 tablet (40 mg total) by mouth daily for 5 days. 11/25/20 11/30/20  Wieters, Hallie C, PA-C  hydrochlorothiazide (HYDRODIURIL) 12.5 MG tablet Take 1 tablet (12.5 mg total) by mouth daily. 01/03/21   Feliberto Gottron, FNP     Objective:  EXAM:   Vitals:   01/03/21 1415  BP: 118/72  Pulse: 78  SpO2: 97%  Weight: 284 lb 3.2 oz (128.9 kg)  Height: 5' 3"  (1.6 m)    General appearance :  A&OX3. Obese. HEENT: Atraumatic and Normocephalic.  PERRLA. EOM intact.  TM clear B. Mouth-MMM, post pharynx WNL w/o erythema, No PND. Neck: supple, no JVD. No cervical lymphadenopathy. No thyromegaly Chest/Lungs:  Breathing-non-labored, Good air entry bilaterally, breath sounds normal without rales, rhonchi, or wheezing  CVS: S1 S2 regular, no murmurs, gallops, rubs  Extremities: +1 Edema to right ankle. Both legs are warm to touch with = pulse throughout Neurology:  CN II-XII grossly intact, Non focal.    Psych:  TP linear. J/I WNL. Normal speech. Appropriate eye contact and affect.  Skin:  No Rash  Data Review No results found for: HGBA1C   Assessment & Plan   1. Benign essential hypertension - Continue taking HCTZ 12.68m po daily - Cut back on salt intake - lose weight - increase exercising - Labs today  2. Obesity, unspecified classification, unspecified obesity type, unspecified whether serious comorbidity present - Exercise at least 30 minutes/day x5 days/week - CBC with Differential - Lipid Panel - Vitamin D, 25-hydroxy  3. Change in vision -Schedule for eye exam as soon as possible - CBC with Differential - CMP14+EGFR - TSH - T4, Free  4. Edema of right ankle - Compression socks as ordered by Podiatry - CMP14+EGFR - Elevate legs when sitting.   Patient have been counseled extensively about nutrition and exercise  Return in about 2 months (around 03/06/2021).  The patient was given clear instructions to go to ER or return to medical center if symptoms don't improve, worsen or new problems develop. The patient verbalized understanding. The patient was told to call to get lab results if they haven't heard anything in the next week.     JFeliberto Gottron APRN, FNP-C [facility] [place] [place]  01/03/2021, 2:57 PM

## 2021-01-04 LAB — CMP14+EGFR
ALT: 13 IU/L (ref 0–32)
AST: 15 IU/L (ref 0–40)
Albumin/Globulin Ratio: 1.5 (ref 1.2–2.2)
Albumin: 4 g/dL (ref 3.9–5.0)
Alkaline Phosphatase: 41 IU/L — ABNORMAL LOW (ref 44–121)
BUN/Creatinine Ratio: 17 (ref 9–23)
BUN: 16 mg/dL (ref 6–20)
Bilirubin Total: 0.2 mg/dL (ref 0.0–1.2)
CO2: 24 mmol/L (ref 20–29)
Calcium: 9.7 mg/dL (ref 8.7–10.2)
Chloride: 106 mmol/L (ref 96–106)
Creatinine, Ser: 0.94 mg/dL (ref 0.57–1.00)
Globulin, Total: 2.7 g/dL (ref 1.5–4.5)
Glucose: 93 mg/dL (ref 65–99)
Potassium: 4.2 mmol/L (ref 3.5–5.2)
Sodium: 140 mmol/L (ref 134–144)
Total Protein: 6.7 g/dL (ref 6.0–8.5)
eGFR: 86 mL/min/{1.73_m2} (ref >59)

## 2021-01-04 LAB — LIPID PANEL
Chol/HDL Ratio: 3.9 ratio (ref 0.0–4.4)
Cholesterol, Total: 166 mg/dL (ref 100–199)
HDL: 43 mg/dL (ref >39)
LDL Chol Calc (NIH): 102 mg/dL — ABNORMAL HIGH (ref 0–99)
Triglycerides: 113 mg/dL (ref 0–149)
VLDL Cholesterol Cal: 21 mg/dL (ref 5–40)

## 2021-01-04 LAB — CBC WITH DIFFERENTIAL/PLATELET
Basophils Absolute: 0 10*3/uL (ref 0.0–0.2)
Basos: 1 %
EOS (ABSOLUTE): 0.1 10*3/uL (ref 0.0–0.4)
Eos: 1 %
Hematocrit: 34.3 % (ref 34.0–46.6)
Hemoglobin: 11.1 g/dL (ref 11.1–15.9)
Immature Grans (Abs): 0 10*3/uL (ref 0.0–0.1)
Immature Granulocytes: 0 %
Lymphocytes Absolute: 1.5 10*3/uL (ref 0.7–3.1)
Lymphs: 24 %
MCH: 24.8 pg — ABNORMAL LOW (ref 26.6–33.0)
MCHC: 32.4 g/dL (ref 31.5–35.7)
MCV: 77 fL — ABNORMAL LOW (ref 79–97)
Monocytes Absolute: 0.4 10*3/uL (ref 0.1–0.9)
Monocytes: 7 %
NRBC: 1 % — ABNORMAL HIGH (ref 0–0)
Neutrophils Absolute: 4.1 10*3/uL (ref 1.4–7.0)
Neutrophils: 67 %
Platelets: 291 10*3/uL (ref 150–450)
RBC: 4.48 x10E6/uL (ref 3.77–5.28)
RDW: 15.7 % — ABNORMAL HIGH (ref 11.7–15.4)
WBC: 6.1 10*3/uL (ref 3.4–10.8)

## 2021-01-04 LAB — TSH: TSH: 1.55 u[IU]/mL (ref 0.450–4.500)

## 2021-01-04 LAB — T4, FREE: Free T4: 1.01 ng/dL (ref 0.82–1.77)

## 2021-01-04 LAB — VITAMIN D 25 HYDROXY (VIT D DEFICIENCY, FRACTURES): Vit D, 25-Hydroxy: 10.6 ng/mL — ABNORMAL LOW (ref 30.0–100.0)

## 2021-01-09 ENCOUNTER — Other Ambulatory Visit: Payer: Self-pay | Admitting: Family

## 2021-01-09 DIAGNOSIS — E559 Vitamin D deficiency, unspecified: Secondary | ICD-10-CM

## 2021-01-09 MED ORDER — VITAMIN D (ERGOCALCIFEROL) 1.25 MG (50000 UNIT) PO CAPS: 50000.0000 [IU] | ORAL_CAPSULE | ORAL | 1 refills | Status: DC

## 2021-03-07 ENCOUNTER — Ambulatory Visit: Payer: BC Managed Care – PPO | Attending: Nurse Practitioner | Admitting: Nurse Practitioner

## 2021-03-07 ENCOUNTER — Encounter: Payer: Self-pay | Admitting: Nurse Practitioner

## 2021-03-07 ENCOUNTER — Other Ambulatory Visit: Payer: Self-pay

## 2021-03-07 VITALS — BP 122/73 | HR 83 | Ht 63.0 in | Wt 291.2 lb

## 2021-03-07 DIAGNOSIS — I10 Essential (primary) hypertension: Secondary | ICD-10-CM

## 2021-03-07 DIAGNOSIS — Z6841 Body Mass Index (BMI) 40.0 and over, adult: Secondary | ICD-10-CM | POA: Diagnosis not present

## 2021-03-07 DIAGNOSIS — J302 Other seasonal allergic rhinitis: Secondary | ICD-10-CM | POA: Diagnosis not present

## 2021-03-07 MED ORDER — VENTOLIN HFA 108 (90 BASE) MCG/ACT IN AERS: 2.0000 | INHALATION_SPRAY | Freq: Four times a day (QID) | RESPIRATORY_TRACT | 1 refills | Status: DC | PRN

## 2021-03-07 NOTE — Progress Notes (Signed)
Assessment & Plan:  Amber Arroyo was seen today for hypertension.  Diagnoses and all orders for this visit:  Essential hypertension Continue all antihypertensives as prescribed.  Remember to bring in your blood pressure log with you for your follow up appointment.  DASH/Mediterranean Diets are healthier choices for HTN.    Seasonal allergies Symptoms well controlled -     VENTOLIN HFA 108 (90 Base) MCG/ACT inhaler; Inhale 2 puffs into the lungs every 6 (six) hours as needed for wheezing or shortness of breath.   Patient has been counseled on age-appropriate routine health concerns for screening and prevention. These are reviewed and up-to-date. Referrals have been placed accordingly. Immunizations are up-to-date or declined.    Subjective:   Chief Complaint  Patient presents with   Hypertension   Hypertension Pertinent negatives include no blurred vision, chest pain, headaches, malaise/fatigue, palpitations or shortness of breath.  Amber Arroyo 25 y.o. female presents to office today for follow up to HTN.  Blood pressure is well controlled today. Denies chest pain, shortness of breath, palpitations, lightheadedness, dizziness, headaches or BLE edema.  She is taking HCTZ 12.5 mg daily.  BP Readings from Last 3 Encounters:  03/07/21 122/73  01/03/21 118/72  11/25/20 (!) 160/91        Review of Systems  Constitutional:  Negative for fever, malaise/fatigue and weight loss.  HENT: Negative.  Negative for nosebleeds.   Eyes: Negative.  Negative for blurred vision, double vision and photophobia.  Respiratory: Negative.  Negative for cough and shortness of breath.   Cardiovascular: Negative.  Negative for chest pain, palpitations and leg swelling.  Gastrointestinal: Negative.  Negative for heartburn, nausea and vomiting.  Musculoskeletal: Negative.  Negative for myalgias.  Neurological: Negative.  Negative for dizziness, focal weakness, seizures and headaches.  Psychiatric/Behavioral:  Negative.  Negative for suicidal ideas.    Past Medical History:  Diagnosis Date   Allergy    eczema, seasonal allergies   Asthma    Eczema    Essential hypertension    Obesity, Class III, BMI 40-49.9 (morbid obesity) (HCC) 11/18/2020    Past Surgical History:  Procedure Laterality Date   INCISION AND DRAINAGE ABSCESS Left 02/04/2013   Procedure: INCISION AND DRAINAGE POSTERIOR NECK ABSCESS;  Surgeon: Flo Shanks, MD;  Location: [facility];  Service: ENT;  Laterality: Left;   MOUTH SURGERY     Wisdom Teeth Extraction    Family History  Problem Relation Age of Onset   Hypertension Mother    Asthma Mother    Eczema Mother    Hypertension Father     Social History Reviewed with no changes to be made today.   Outpatient Medications Prior to Visit  Medication Sig Dispense Refill   cetirizine (ZYRTEC ALLERGY) 10 MG tablet Take 1 tablet (10 mg total) by mouth daily. 30 tablet 0   fluticasone (FLONASE) 50 MCG/ACT nasal spray Place 1-2 sprays into both nostrils daily as needed for allergies or rhinitis.     hydrochlorothiazide (HYDRODIURIL) 12.5 MG tablet Take 1 tablet (12.5 mg total) by mouth daily. 90 tablet 1   methylPREDNISolone (MEDROL DOSEPAK) 4 MG TBPK tablet 6 day dose pack - take as directed 21 tablet 0   naproxen (NAPROSYN) 500 MG tablet Take 1 tablet (500 mg total) by mouth 2 (two) times daily. 30 tablet 0   triamcinolone cream (KENALOG) 0.1 % APPLY EXTERNALLY TO THE AFFECTED AREA TWICE DAILY (Patient taking differently: Apply 1 application topically See admin instructions. Apply to affected areas 2 times  a day) 80 g 0   Vitamin D, Ergocalciferol, (DRISDOL) 1.25 MG (50000 UNIT) CAPS capsule Take 1 capsule (50,000 Units total) by mouth every 7 (seven) days. 5 capsule 1   VENTOLIN HFA 108 (90 Base) MCG/ACT inhaler Inhale 2 puffs into the lungs every 6 (six) hours as needed for wheezing or shortness of breath. 18 g 1   furosemide (LASIX) 40 MG tablet Take 1 tablet (40 mg total) by  mouth daily for 5 days. 5 tablet 0   No facility-administered medications prior to visit.    Allergies  Allergen Reactions   Fish-Derived Products Shortness Of Breath, Swelling and Other (See Comments)    Lips swell   Shellfish-Derived Products Shortness Of Breath, Swelling and Other (See Comments)    Lips swell   Tree Extract Other (See Comments)    Patient was told she is allergic to trees       Objective:    BP 122/73   Pulse 83   Ht 5\' 3"  (1.6 m)   Wt 291 lb 4 oz (132.1 kg)   LMP 02/03/2021 (Approximate)   SpO2 98%   BMI 51.59 kg/m  Wt Readings from Last 3 Encounters:  03/07/21 291 lb 4 oz (132.1 kg)  01/03/21 284 lb 3.2 oz (128.9 kg)  11/21/20 285 lb (129.3 kg)    Physical Exam Vitals and nursing note reviewed.  Constitutional:      Appearance: She is well-developed.  HENT:     Head: Normocephalic and atraumatic.  Cardiovascular:     Rate and Rhythm: Normal rate and regular rhythm.     Heart sounds: Normal heart sounds. No murmur heard.   No friction rub. No gallop.  Pulmonary:     Effort: Pulmonary effort is normal. No tachypnea or respiratory distress.     Breath sounds: Normal breath sounds. No decreased breath sounds, wheezing, rhonchi or rales.  Chest:     Chest wall: No tenderness.  Abdominal:     General: Bowel sounds are normal.     Palpations: Abdomen is soft.  Musculoskeletal:        General: Normal range of motion.     Cervical back: Normal range of motion.  Skin:    General: Skin is warm and dry.  Neurological:     Mental Status: She is alert and oriented to person, place, and time.     Coordination: Coordination normal.  Psychiatric:        Behavior: Behavior normal. Behavior is cooperative.        Thought Content: Thought content normal.        Judgment: Judgment normal.         Patient has been counseled extensively about nutrition and exercise as well as the importance of adherence with medications and regular follow-up. The  patient was given clear instructions to go to ER or return to medical center if symptoms don't improve, worsen or new problems develop. The patient verbalized understanding.   Follow-up: Return in about 3 months (around 06/06/2021) for FOLLOW UP WITH PCP NEWLIN.   06/08/2021, FNP-BC [facility] [place], [place]   03/07/2021, 4:07 PM

## 2021-04-06 ENCOUNTER — Other Ambulatory Visit: Payer: Self-pay | Admitting: Family Medicine

## 2021-04-06 ENCOUNTER — Telehealth: Payer: Self-pay

## 2021-04-06 DIAGNOSIS — J302 Other seasonal allergic rhinitis: Secondary | ICD-10-CM

## 2021-04-06 NOTE — Telephone Encounter (Signed)
Received a prescription request 2 weeks after script written. Attempted to call [facility] to find out of RF had been picked up. Pharmacy closed.  Called pt to find out if she is needing another inhaler 2 weeks after the script was written. Pt stated she has not picked up the RF and requested that the RF get sent to [facility] #[id number].

## 2021-04-06 NOTE — Telephone Encounter (Signed)
Change of pharmacy Requested Prescriptions  Pending Prescriptions Disp Refills  . VENTOLIN HFA 108 (90 Base) MCG/ACT inhaler [Pharmacy Med Name: VENTOLIN HFA INH W/DOS CTR 200PUFFS] 18 g 0    Sig: INHALE 2 PUFFS INTO THE LUNGS EVERY 6 HOURS AS NEEDED FOR WHEEZING OR SHORTNESS OF BREATH     Pulmonology:  Beta Agonists Failed - 04/06/2021  3:50 PM      Failed - One inhaler should last at least one month. If the patient is requesting refills earlier, contact the patient to check for uncontrolled symptoms.      Passed - Valid encounter within last 12 months    Recent Outpatient Visits          1 month ago Essential hypertension   [place] [facility] [place], Shea Stakes, NP   3 months ago Benign essential hypertension   [place] [facility] Claiborne Rigg, NP   8 months ago Essential hypertension   [place] [facility] Hoy Register, MD   1 year ago Other fatigue   [place] [facility] Hoy Register, MD   1 year ago Bilateral leg edema   [facility] [place], [place], NP

## 2021-04-16 DIAGNOSIS — L2089 Other atopic dermatitis: Secondary | ICD-10-CM | POA: Diagnosis not present

## 2021-07-17 ENCOUNTER — Other Ambulatory Visit: Payer: Self-pay | Admitting: Nurse Practitioner

## 2021-07-17 DIAGNOSIS — J302 Other seasonal allergic rhinitis: Secondary | ICD-10-CM

## 2021-07-17 NOTE — Telephone Encounter (Signed)
Requested Prescriptions  Pending Prescriptions Disp Refills   VENTOLIN HFA 108 (90 Base) MCG/ACT inhaler [Pharmacy Med Name: VENTOLIN HFA INH W/DOS CTR 200PUFFS] 18 g 0    Sig: INHALE 2 PUFFS INTO THE LUNGS EVERY 6 HOURS AS NEEDED FOR WHEEZING OR SHORTNESS OF BREATH     Pulmonology:  Beta Agonists Failed - 07/17/2021  1:44 PM      Failed - One inhaler should last at least one month. If the patient is requesting refills earlier, contact the patient to check for uncontrolled symptoms.      Passed - Valid encounter within last 12 months    Recent Outpatient Visits          4 months ago Essential hypertension   [place], Vernia Buff, NP   6 months ago Benign essential hypertension   [place], Zelda W, NP   1 year ago Essential hypertension   [place], Enobong, MD   1 year ago Other fatigue   [place], Enobong, MD   1 year ago Bilateral leg edema   [place]onz[place], Amy J, NP      Future Appointments            In 4 days Ladell Pier, MD [place]

## 2021-07-21 ENCOUNTER — Ambulatory Visit: Payer: BC Managed Care – PPO | Attending: Internal Medicine | Admitting: Internal Medicine

## 2021-07-21 ENCOUNTER — Encounter: Payer: Self-pay | Admitting: Internal Medicine

## 2021-07-21 ENCOUNTER — Other Ambulatory Visit: Payer: Self-pay

## 2021-07-21 VITALS — BP 119/84 | HR 77 | Resp 16 | Wt 296.2 lb

## 2021-07-21 DIAGNOSIS — N926 Irregular menstruation, unspecified: Secondary | ICD-10-CM

## 2021-07-21 LAB — POCT URINE PREGNANCY: Preg Test, Ur: NEGATIVE

## 2021-07-21 NOTE — Progress Notes (Signed)
Patient ID: Amber Arroyo, female    DOB: [birth year]  MRN: [id number]  CC: Menstrual Problem   Subjective: Amber Arroyo is a 26 y.o. female who presents for UC visit Her concerns today include:  Pt with hx of asthma, obesity,   Missed menses last mth  Now bleeding since 07/07/2021; patient states that bleeding has been alternating between light bleeding 1 day and then spotting the next.   Usually gets cycle every month around the 25th which would last for days.  Bleeding is usually moderate the first couple days and then like the last day. Not on any BCP or shots Did home pregnancy test 1 wk ago.  Negative. Last sex active was in December Last PAP was 1 yr ago through her OB GYN at [facility].  Nl.   Patient Active Problem List   Diagnosis Date Noted   Change in vision 01/03/2021   Cellulitis of right leg 11/18/2020   Sepsis (HCC) 11/18/2020   Obesity, Class III, BMI 40-49.9 (morbid obesity) (HCC) 11/18/2020   Asthma    Cellulitis 07/03/2017   Seasonal allergies 02/10/2016   Eczema 01/07/2016   Obesity 01/07/2016   Benign essential hypertension 07/17/2013   Abscess of neck 02/04/2013     Current Outpatient Medications on File Prior to Visit  Medication Sig Dispense Refill   cetirizine (ZYRTEC ALLERGY) 10 MG tablet Take 1 tablet (10 mg total) by mouth daily. 30 tablet 0   fluticasone (FLONASE) 50 MCG/ACT nasal spray Place 1-2 sprays into both nostrils daily as needed for allergies or rhinitis.     hydrochlorothiazide (HYDRODIURIL) 12.5 MG tablet Take 1 tablet (12.5 mg total) by mouth daily. 90 tablet 1   methylPREDNISolone (MEDROL DOSEPAK) 4 MG TBPK tablet 6 day dose pack - take as directed 21 tablet 0   naproxen (NAPROSYN) 500 MG tablet Take 1 tablet (500 mg total) by mouth 2 (two) times daily. 30 tablet 0   triamcinolone cream (KENALOG) 0.1 % APPLY EXTERNALLY TO THE AFFECTED AREA TWICE DAILY (Patient taking differently: Apply 1 application topically See admin  instructions. Apply to affected areas 2 times a day) 80 g 0   VENTOLIN HFA 108 (90 Base) MCG/ACT inhaler INHALE 2 PUFFS INTO THE LUNGS EVERY 6 HOURS AS NEEDED FOR WHEEZING OR SHORTNESS OF BREATH 18 g 0   Vitamin D, Ergocalciferol, (DRISDOL) 1.25 MG (50000 UNIT) CAPS capsule Take 1 capsule (50,000 Units total) by mouth every 7 (seven) days. 5 capsule 1   No current facility-administered medications on file prior to visit.    Allergies  Allergen Reactions   Fish-Derived Products Shortness Of Breath, Swelling and Other (See Comments)    Lips swell   Shellfish-Derived Products Shortness Of Breath, Swelling and Other (See Comments)    Lips swell   Tree Extract Other (See Comments)    Patient was told she is allergic to trees    Social History   Socioeconomic History   Marital status: Single    Spouse name: Not on file   Number of children: Not on file   Years of education: Not on file   Highest education level: Not on file  Occupational History   Not on file  Tobacco Use   Smoking status: Never   Smokeless tobacco: Never  Vaping Use   Vaping Use: Never used  Substance and Sexual Activity   Alcohol use: No   Drug use: No   Sexual activity: Yes    Birth control/protection: Condom, None  Other Topics Concern   Not on file  Social History Narrative   Lives with boyfriend.  No pets.   Social Determinants of Health   Financial Resource Strain: Not on file  Food Insecurity: Not on file  Transportation Needs: Not on file  Physical Activity: Not on file  Stress: Not on file  Social Connections: Not on file  Intimate Partner Violence: Not on file    Family History  Problem Relation Age of Onset   Hypertension Mother    Asthma Mother    Eczema Mother    Hypertension Father     Past Surgical History:  Procedure Laterality Date   INCISION AND DRAINAGE ABSCESS Left 02/04/2013   Procedure: INCISION AND DRAINAGE POSTERIOR NECK ABSCESS;  Surgeon: Jodi Marble, MD;  Location:  [place];  Service: ENT;  Laterality: Left;   MOUTH SURGERY     Wisdom Teeth Extraction    ROS: Review of Systems Negative except as stated above  PHYSICAL EXAM: BP 119/84    Pulse 77    Resp 16    Wt 296 lb 3.2 oz (134.4 kg)    SpO2 98%    BMI 52.47 kg/m   Physical Exam  General appearance - alert, well appearing, and in no distress Mental status - normal mood, behavior, speech, dress, motor activity, and thought processes   CMP Latest Ref Rng & Units 01/03/2021 11/19/2020 11/18/2020  Glucose 65 - 99 mg/dL 93 105(H) 102(H)  BUN 6 - 20 mg/dL 16 9 14   Creatinine 0.57 - 1.00 mg/dL 0.94 0.64 0.83  Sodium 134 - 144 mmol/L 140 138 138  Potassium 3.5 - 5.2 mmol/L 4.2 3.6 3.3(L)  Chloride 96 - 106 mmol/L 106 106 103  CO2 20 - 29 mmol/L 24 27 29   Calcium 8.7 - 10.2 mg/dL 9.7 8.4(L) 9.0  Total Protein 6.0 - 8.5 g/dL 6.7 - 7.6  Total Bilirubin 0.0 - 1.2 mg/dL 0.2 - 0.3  Alkaline Phos 44 - 121 IU/L 41(L) - 50  AST 0 - 40 IU/L 15 - 14(L)  ALT 0 - 32 IU/L 13 - 14   Lipid Panel     Component Value Date/Time   CHOL 166 01/03/2021 1454   TRIG 113 01/03/2021 1454   HDL 43 01/03/2021 1454   CHOLHDL 3.9 01/03/2021 1454   LDLCALC 102 (H) 01/03/2021 1454    CBC    Component Value Date/Time   WBC 6.1 01/03/2021 1454   WBC 10.4 11/20/2020 0331   RBC 4.48 01/03/2021 1454   RBC 4.26 11/20/2020 0331   HGB 11.1 01/03/2021 1454   HCT 34.3 01/03/2021 1454   PLT 291 01/03/2021 1454   MCV 77 (L) 01/03/2021 1454   MCH 24.8 (L) 01/03/2021 1454   MCH 24.9 (L) 11/20/2020 0331   MCHC 32.4 01/03/2021 1454   MCHC 31.4 11/20/2020 0331   RDW 15.7 (H) 01/03/2021 1454   LYMPHSABS 1.5 01/03/2021 1454   MONOABS 0.9 11/18/2020 1530   EOSABS 0.1 01/03/2021 1454   BASOSABS 0.0 01/03/2021 1454   Results for orders placed or performed in visit on 07/21/21  POCT urine pregnancy  Result Value Ref Range   Preg Test, Ur Negative Negative    ASSESSMENT AND PLAN: 1. 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  Advised her to keep a calendar of her bleeding episodes.  Since bleeding has been light to spotting, will not give Provera at this time.  We will wait to see if the  bleeding stops on its own.  I have also referred her to gynecology. - Ambulatory referral to Gynecology - POCT urine pregnancy    Patient was given the opportunity to ask questions.  Patient verbalized understanding of the plan and was able to repeat key elements of the plan.   Orders Placed This Encounter  Procedures   Ambulatory referral to Gynecology   POCT urine pregnancy     Requested Prescriptions    No prescriptions requested or ordered in this encounter    No follow-ups on file.  Karle Plumber, MD, FACP

## 2021-07-29 DIAGNOSIS — N939 Abnormal uterine and vaginal bleeding, unspecified: Secondary | ICD-10-CM | POA: Diagnosis not present

## 2021-07-29 DIAGNOSIS — Z113 Encounter for screening for infections with a predominantly sexual mode of transmission: Secondary | ICD-10-CM | POA: Diagnosis not present

## 2021-07-29 DIAGNOSIS — N898 Other specified noninflammatory disorders of vagina: Secondary | ICD-10-CM | POA: Diagnosis not present

## 2021-08-14 ENCOUNTER — Other Ambulatory Visit: Payer: Self-pay | Admitting: Family Medicine

## 2021-08-14 DIAGNOSIS — J302 Other seasonal allergic rhinitis: Secondary | ICD-10-CM

## 2021-08-14 NOTE — Telephone Encounter (Signed)
Requested Prescriptions  Pending Prescriptions Disp Refills   VENTOLIN HFA 108 (90 Base) MCG/ACT inhaler [Pharmacy Med Name: VENTOLIN HFA INH W/DOS CTR 200PUFFS] 18 g 0    Sig: INHALE 2 PUFFS INTO THE LUNGS EVERY 6 HOURS AS NEEDED FOR WHEEZING OR SHORTNESS OF BREATH     Pulmonology:  Beta Agonists 2 Passed - 08/14/2021 11:31 AM      Passed - Last BP in normal range    BP Readings from Last 1 Encounters:  07/21/21 119/84         Passed - Last Heart Rate in normal range    Pulse Readings from Last 1 Encounters:  07/21/21 77         Passed - Valid encounter within last 12 months    Recent Outpatient Visits          3 weeks ago Irregular periods   [facility] Marcine Matar, MD   5 months ago Essential hypertension   [facility] [place], Shea Stakes, NP   7 months ago Benign essential hypertension   [facility] [place], Shea Stakes, NP   1 year ago Essential hypertension   [facility] Hoy Register, MD   1 year ago Other fatigue   [facility] Hoy Register, MD

## 2021-09-25 ENCOUNTER — Other Ambulatory Visit: Payer: Self-pay | Admitting: Family Medicine

## 2021-09-25 DIAGNOSIS — J302 Other seasonal allergic rhinitis: Secondary | ICD-10-CM

## 2021-12-08 ENCOUNTER — Ambulatory Visit
Admission: EM | Admit: 2021-12-08 | Discharge: 2021-12-08 | Disposition: A | Discharge status: Home / Self Care | Payer: BC Managed Care – PPO | Attending: Internal Medicine | Admitting: Internal Medicine

## 2021-12-08 DIAGNOSIS — L03115 Cellulitis of right lower limb: Secondary | ICD-10-CM

## 2021-12-08 MED ORDER — CEPHALEXIN 500 MG PO CAPS: 500.0000 mg | ORAL_CAPSULE | Freq: Three times a day (TID) | ORAL | 0 refills | Status: AC

## 2021-12-08 NOTE — ED Triage Notes (Signed)
Pt c/o erythema to medial right shin that is pruritic onset yesterday concerned for cellulitis.

## 2021-12-08 NOTE — ED Provider Notes (Signed)
[name]    CSN: [id number] Arrival date & time: 12/08/21  1431      History   Chief Complaint Chief Complaint  Patient presents with   Cellulitis    HPI Amber Arroyo is a 26 y.o. female comes to the urgent care with painful redness in the right lower extremity which started yesterday.  Pain is of moderate severity with no known aggravating or relieving factors.  No fever, chills, generalized body aches or joint aches.  No insect bite or blisters.   HPI  Past Medical History:  Diagnosis Date   Allergy    eczema, seasonal allergies   Asthma    Eczema    Essential hypertension    Obesity, Class III, BMI 40-49.9 (morbid obesity) (HCC) 11/18/2020    Patient Active Problem List   Diagnosis Date Noted   Change in vision 01/03/2021   Cellulitis of right leg 11/18/2020   Sepsis (HCC) 11/18/2020   Obesity, Class III, BMI 40-49.9 (morbid obesity) (HCC) 11/18/2020   Asthma    Cellulitis 07/03/2017   Seasonal allergies 02/10/2016   Eczema 01/07/2016   Obesity 01/07/2016   Benign essential hypertension 07/17/2013   Abscess of neck 02/04/2013    Past Surgical History:  Procedure Laterality Date   INCISION AND DRAINAGE ABSCESS Left 02/04/2013   Procedure: INCISION AND DRAINAGE POSTERIOR NECK ABSCESS;  Surgeon: Flo Shanks, MD;  Location: [facility] OR;  Service: ENT;  Laterality: Left;   MOUTH SURGERY     Wisdom Teeth Extraction    OB History   No obstetric history on file.      Home Medications    Prior to Admission medications   Medication Sig Start Date End Date Taking? Authorizing Provider  cephALEXin (KEFLEX) 500 MG capsule Take 1 capsule (500 mg total) by mouth 3 (three) times daily for 7 days. 12/08/21 12/15/21 Yes Mischell [place], Britta Mccreedy, MD  levocetirizine (XYZAL) 5 MG tablet 1 tablet in the evening 10/29/20  Yes [provider]  betamethasone, augmented, (DIPROLENE) 0.05 % lotion betamethasone, augmented 0.05 % lotion  APPLY A SMALL AMOUNT TO THE  SCALP ONCE A DAY    [provider]  cetirizine (ZYRTEC ALLERGY) 10 MG tablet Take 1 tablet (10 mg total) by mouth daily. 06/22/20   Hall-Potvin, [place], PA-C  EPINEPHrine 0.3 mg/0.3 mL IJ SOAJ injection See admin instructions.    [provider]  fluticasone (FLONASE) 50 MCG/ACT nasal spray Place 1-2 sprays into both nostrils daily as needed for allergies or rhinitis. 01/03/21   Eleonore Chiquito, FNP  hydrochlorothiazide (HYDRODIURIL) 12.5 MG tablet Take 1 tablet (12.5 mg total) by mouth daily. 01/03/21   Eleonore Chiquito, FNP  naproxen (NAPROSYN) 500 MG tablet Take 1 tablet (500 mg total) by mouth 2 (two) times daily. 11/25/20   Wieters, Hallie C, PA-C  triamcinolone cream (KENALOG) 0.1 % APPLY EXTERNALLY TO THE AFFECTED AREA TWICE DAILY Patient taking differently: Apply 1 application topically See admin instructions. Apply to affected areas 2 times a day 02/25/18   Hoy Register, MD  VENTOLIN HFA 108 (90 Base) MCG/ACT inhaler INHALE 2 PUFFS INTO THE LUNGS EVERY 6 HOURS AS NEEDED FOR WHEEZING OR SHORTNESS OF BREATH 09/25/21   Hoy Register, MD  Vitamin D, Ergocalciferol, (DRISDOL) 1.25 MG (50000 UNIT) CAPS capsule Take 1 capsule (50,000 Units total) by mouth every 7 (seven) days. 01/09/21   Eleonore Chiquito, FNP    Family History Family History  Problem Relation Age of Onset   Hypertension Mother  Asthma Mother    Eczema Mother    Hypertension Father     Social History Social History   Tobacco Use   Smoking status: Never   Smokeless tobacco: Never  Vaping Use   Vaping Use: Never used  Substance Use Topics   Alcohol use: No   Drug use: No     Allergies   Fish-derived products, Shellfish-derived products, and Tree extract   Review of Systems Review of Systems  Gastrointestinal: Negative.   Genitourinary: Negative.   Musculoskeletal: Negative.   Skin:  Positive for color change. Negative for rash and wound.     Physical Exam Triage Vital Signs ED Triage  Vitals [12/08/21 1440]  Enc Vitals Group     BP 135/84     Pulse Rate 98     Resp 18     Temp 98 F (36.7 C)     Temp Source Oral     SpO2 98 %     Weight      Height      Head Circumference      Peak Flow      Pain Score 0     Pain Loc      Pain Edu?      Excl. in GC?    No data found.  Updated Vital Signs BP 135/84 (BP Location: Left Arm)   Pulse 98   Temp 98 F (36.7 C) (Oral)   Resp 18   SpO2 98%   Visual Acuity Right Eye Distance:   Left Eye Distance:   Bilateral Distance:    Right Eye Near:   Left Eye Near:    Bilateral Near:     Physical Exam Vitals and nursing note reviewed.  Constitutional:      Appearance: She is not toxic-appearing or diaphoretic.  Cardiovascular:     Rate and Rhythm: Normal rate and regular rhythm.  Musculoskeletal:        General: No swelling or tenderness. Normal range of motion.  Skin:    General: Skin is warm.     Capillary Refill: Capillary refill takes less than 2 seconds.     Findings: Erythema present. No bruising or lesion.  Neurological:     General: No focal deficit present.     Mental Status: She is alert and oriented to person, place, and time.      UC Treatments / Results  Labs (all labs ordered are listed, but only abnormal results are displayed) Labs Reviewed - No data to display  EKG   Radiology No results found.  Procedures Procedures (including critical care time)  Medications Ordered in UC Medications - No data to display  Initial Impression / Assessment and Plan / UC Course  I have reviewed the triage vital signs and the nursing notes.  Pertinent labs & imaging results that were available during my care of the patient were reviewed by me and considered in my medical decision making (see chart for details).     1.  Cellulitis of the right flank: Keflex 500 mg 3 times daily for 7 days Tylenol/ibuprofen as needed for pain and/or fever Elevation of the right leg Return precautions  given. Final Clinical Impressions(s) / UC Diagnoses   Final diagnoses:  Cellulitis of leg, right     Discharge Instructions      Please take antibiotics as prescribed Elevate right leg Return to urgent care if symptoms worsen.   ED Prescriptions     Medication Sig Dispense Auth. Provider  cephALEXin (KEFLEX) 500 MG capsule Take 1 capsule (500 mg total) by mouth 3 (three) times daily for 7 days. 20 capsule Nicoles Sedlacek, Britta Mccreedy, MD      PDMP not reviewed this encounter.   Merrilee Jansky, MD 12/08/21 2213

## 2021-12-08 NOTE — Discharge Instructions (Addendum)
Please take antibiotics as prescribed Elevate right leg Return to urgent care if symptoms worsen.

## 2022-04-17 ENCOUNTER — Ambulatory Visit
Admission: EM | Admit: 2022-04-17 | Discharge: 2022-04-17 | Disposition: A | Discharge status: Home / Self Care | Payer: BC Managed Care – PPO | Attending: Physician Assistant | Admitting: Physician Assistant

## 2022-04-17 DIAGNOSIS — N926 Irregular menstruation, unspecified: Secondary | ICD-10-CM

## 2022-04-17 DIAGNOSIS — R197 Diarrhea, unspecified: Secondary | ICD-10-CM

## 2022-04-17 DIAGNOSIS — R112 Nausea with vomiting, unspecified: Secondary | ICD-10-CM

## 2022-04-17 LAB — POCT URINE PREGNANCY: Preg Test, Ur: NEGATIVE

## 2022-04-17 NOTE — ED Triage Notes (Signed)
Pt presents with 2 episodes of vomiting and diarrhea this week; pt also complains of missed period.

## 2022-04-17 NOTE — Discharge Instructions (Signed)
  Please repeat pregnancy test at home if no period over the next few weeks.

## 2022-04-18 NOTE — ED Provider Notes (Signed)
[name]    CSN: [id number] Arrival date & time: 04/17/22  1451      History   Chief Complaint Chief Complaint  Patient presents with   Emesis   Diarrhea   Possible Pregnancy    HPI Amber Arroyo is a 26 y.o. female.   Patient here today for evaluation of 2 episodes of vomiting and diarrhea over the last week.  She reports initially she had 1 episode of vomiting 5 days ago and then had another episode of vomiting today.  She also reports some loose stools on the days she has vomited.  She does report generalized abdominal pain at times.  She has not had fever.  She denies any blood in her stool or dark tarry stools.  Her last menstrual period was the beginning of September and she would like pregnancy test.  The history is provided by the patient.  Emesis Associated symptoms: diarrhea   Associated symptoms: no chills, no fever and no sore throat   Diarrhea Associated symptoms: vomiting   Associated symptoms: no chills and no fever   Possible Pregnancy Pertinent negatives include no shortness of breath.    Past Medical History:  Diagnosis Date   Allergy    eczema, seasonal allergies   Asthma    Eczema    Essential hypertension    Obesity, Class III, BMI 40-49.9 (morbid obesity) (HCC) 11/18/2020    Patient Active Problem List   Diagnosis Date Noted   Change in vision 01/03/2021   Cellulitis of right leg 11/18/2020   Sepsis (HCC) 11/18/2020   Obesity, Class III, BMI 40-49.9 (morbid obesity) (HCC) 11/18/2020   Asthma    Cellulitis 07/03/2017   Seasonal allergies 02/10/2016   Eczema 01/07/2016   Obesity 01/07/2016   Benign essential hypertension 07/17/2013   Abscess of neck 02/04/2013    Past Surgical History:  Procedure Laterality Date   INCISION AND DRAINAGE ABSCESS Left 02/04/2013   Procedure: INCISION AND DRAINAGE POSTERIOR NECK ABSCESS;  Surgeon: Flo Shanks, MD;  Location: [facility] OR;  Service: ENT;  Laterality: Left;   MOUTH SURGERY     Wisdom  Teeth Extraction    OB History   No obstetric history on file.      Home Medications    Prior to Admission medications   Medication Sig Start Date End Date Taking? Authorizing Provider  betamethasone, augmented, (DIPROLENE) 0.05 % lotion betamethasone, augmented 0.05 % lotion  APPLY A SMALL AMOUNT TO THE SCALP ONCE A DAY    [provider]  cetirizine (ZYRTEC ALLERGY) 10 MG tablet Take 1 tablet (10 mg total) by mouth daily. 06/22/20   Hall-Potvin, [place], PA-C  EPINEPHrine 0.3 mg/0.3 mL IJ SOAJ injection See admin instructions.    [provider]  fluticasone (FLONASE) 50 MCG/ACT nasal spray Place 1-2 sprays into both nostrils daily as needed for allergies or rhinitis. 01/03/21   Eleonore Chiquito, FNP  hydrochlorothiazide (HYDRODIURIL) 12.5 MG tablet Take 1 tablet (12.5 mg total) by mouth daily. 01/03/21   Eleonore Chiquito, FNP  levocetirizine (XYZAL) 5 MG tablet 1 tablet in the evening 10/29/20   [provider]  naproxen (NAPROSYN) 500 MG tablet Take 1 tablet (500 mg total) by mouth 2 (two) times daily. 11/25/20   Wieters, Hallie C, PA-C  triamcinolone cream (KENALOG) 0.1 % APPLY EXTERNALLY TO THE AFFECTED AREA TWICE DAILY Patient taking differently: Apply 1 application topically See admin instructions. Apply to affected areas 2 times a day 02/25/18   Hoy Register, MD  VENTOLIN HFA 108 (90 Base) MCG/ACT inhaler INHALE 2 PUFFS INTO THE LUNGS EVERY 6 HOURS AS NEEDED FOR WHEEZING OR SHORTNESS OF BREATH 09/25/21   Hoy Register, MD  Vitamin D, Ergocalciferol, (DRISDOL) 1.25 MG (50000 UNIT) CAPS capsule Take 1 capsule (50,000 Units total) by mouth every 7 (seven) days. 01/09/21   Eleonore Chiquito, FNP    Family History Family History  Problem Relation Age of Onset   Hypertension Mother    Asthma Mother    Eczema Mother    Hypertension Father     Social History Social History   Tobacco Use   Smoking status: Never   Smokeless tobacco: Never  Vaping Use    Vaping Use: Never used  Substance Use Topics   Alcohol use: No   Drug use: No     Allergies   Fish-derived products, Shellfish-derived products, and Tree extract   Review of Systems Review of Systems  Constitutional:  Negative for chills and fever.  HENT:  Negative for congestion and sore throat.   Eyes:  Negative for discharge and redness.  Respiratory:  Negative for shortness of breath.   Gastrointestinal:  Positive for diarrhea and vomiting.  Genitourinary:  Positive for menstrual problem.     Physical Exam Triage Vital Signs ED Triage Vitals  Enc Vitals Group     BP 04/17/22 1559 136/84     Pulse Rate 04/17/22 1559 82     Resp 04/17/22 1559 18     Temp 04/17/22 1559 (!) 97.3 F (36.3 C)     Temp Source 04/17/22 1559 Oral     SpO2 04/17/22 1559 98 %     Weight --      Height --      Head Circumference --      Peak Flow --      Pain Score 04/17/22 1558 0     Pain Loc --      Pain Edu? --      Excl. in GC? --    No data found.  Updated Vital Signs BP 136/84 (BP Location: Right Arm)   Pulse 82   Temp (!) 97.3 F (36.3 C) (Oral)   Resp 18   LMP 02/25/2022   SpO2 98%      Physical Exam Vitals and nursing note reviewed.  Constitutional:      General: She is not in acute distress.    Appearance: Normal appearance. She is not ill-appearing.  HENT:     Head: Normocephalic and atraumatic.  Eyes:     Conjunctiva/sclera: Conjunctivae normal.  Cardiovascular:     Rate and Rhythm: Normal rate.  Pulmonary:     Effort: Pulmonary effort is normal. No respiratory distress.  Neurological:     Mental Status: She is alert.  Psychiatric:        Mood and Affect: Mood normal.        Behavior: Behavior normal.      UC Treatments / Results  Labs (all labs ordered are listed, but only abnormal results are displayed) Labs Reviewed  BETA HCG QUANT (REF LAB)  POCT URINE PREGNANCY    EKG   Radiology No results found.  Procedures Procedures (including  critical care time)  Medications Ordered in UC Medications - No data to display  Initial Impression / Assessment and Plan / UC Course  I have reviewed the triage vital signs and the nursing notes.  Pertinent labs & imaging results that were available during my care of the patient were  reviewed by me and considered in my medical decision making (see chart for details).    Urine pregnancy screening negative.  Will order hCG quant.  Discussed that given intermittent nature of nausea and vomiting, low concern for infectious cause other than possible viral illness.  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  Encouraged follow-up if symptoms persist or with any further concerns.  Final Clinical Impressions(s) / UC Diagnoses   Final diagnoses:  Missed period  Nausea vomiting and diarrhea     Discharge Instructions       Please repeat pregnancy test at home if no period over the next few weeks.     ED Prescriptions   None    PDMP not reviewed this encounter.   Francene Finders, PA-C 04/18/22 [phone]

## 2022-04-19 LAB — BETA HCG QUANT (REF LAB): hCG Quant: <1 m[IU]/mL

## 2022-12-22 IMAGING — DX DG CHEST 1V PORT
1 series · 1 of 1 positions shown · non-contrast
Comparison: None.

CLINICAL DATA: Shortness of breath

EXAM:
PORTABLE CHEST 1 VIEW

[chest ap]
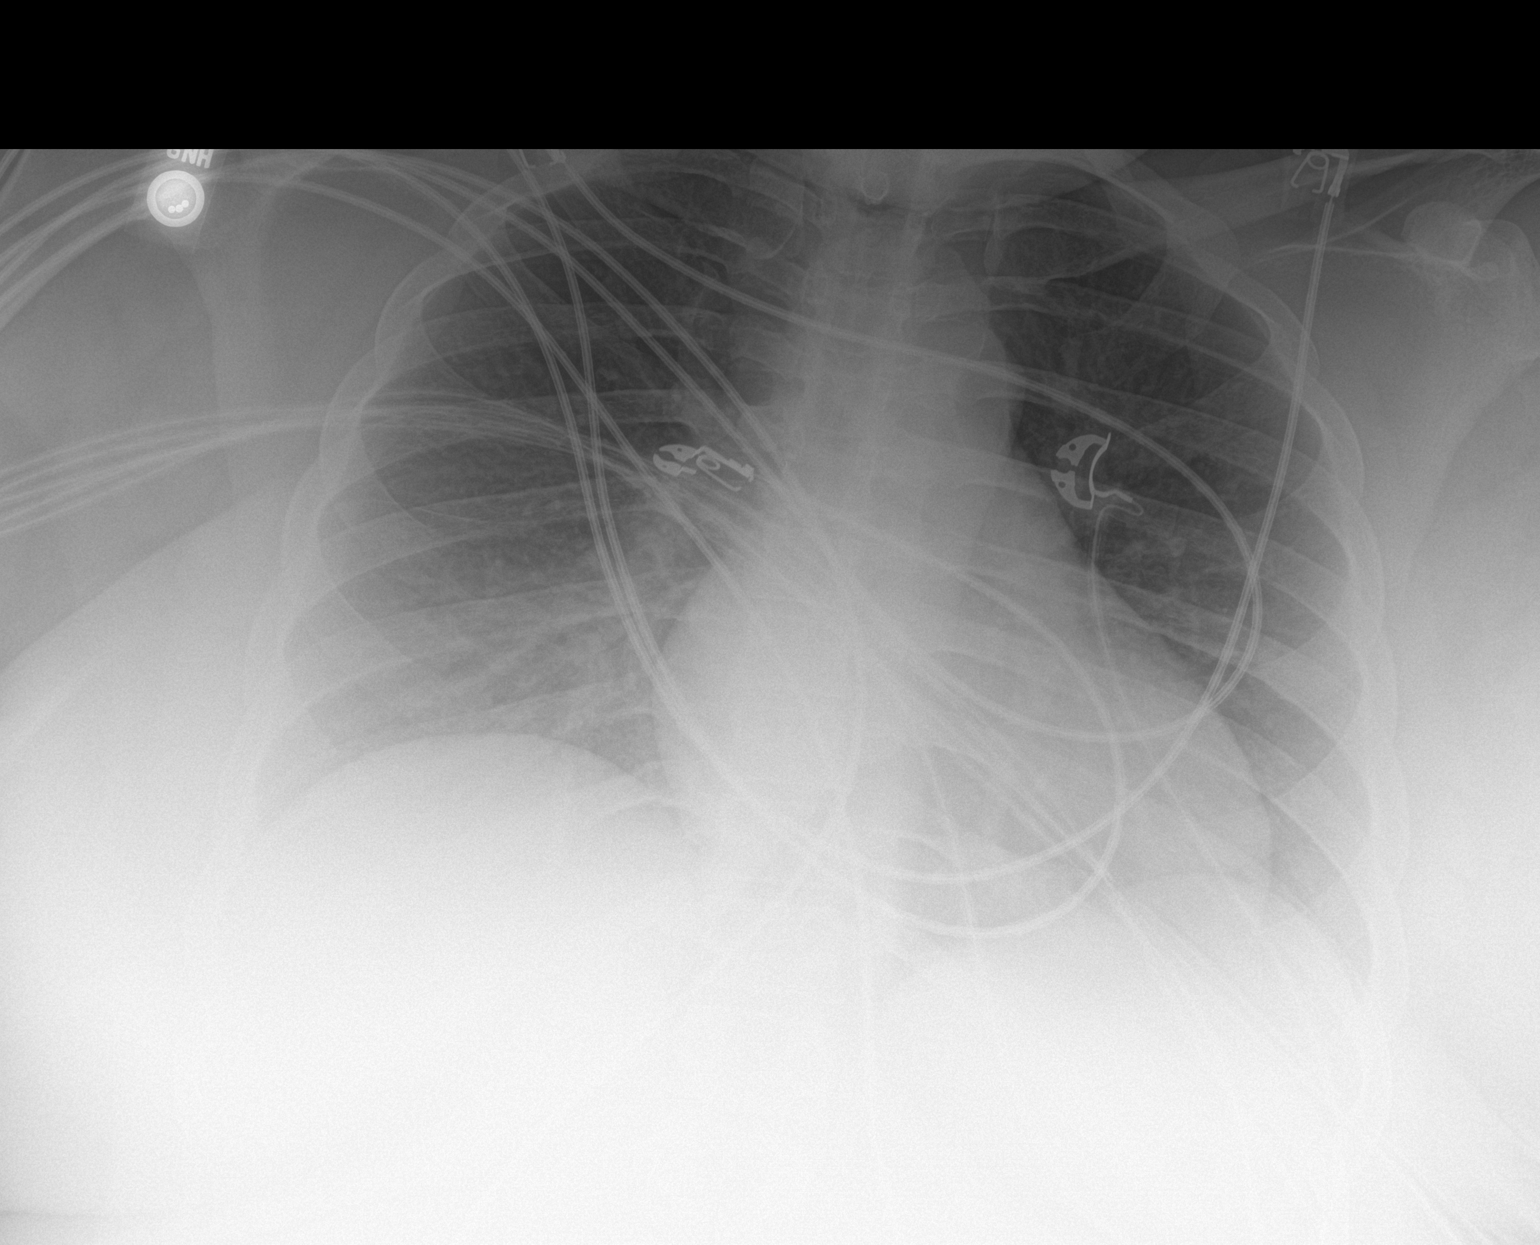

[1 of 1 positions shown; findings below may reference images not displayed]

FINDINGS: The cardiomediastinal silhouette is the upper limits of normal in
contour, likely accentuated due to technique. No pleural effusion.
No pneumothorax. No acute pleuroparenchymal abnormality. Visualized
abdomen is unremarkable. No acute osseous abnormality noted.
IMPRESSION: No acute cardiopulmonary abnormality.

## 2023-04-02 ENCOUNTER — Ambulatory Visit
Admission: EM | Admit: 2023-04-02 | Discharge: 2023-04-02 | Disposition: A | Discharge status: Home / Self Care | Payer: No Typology Code available for payment source | Attending: Physician Assistant | Admitting: Physician Assistant

## 2023-04-02 DIAGNOSIS — R112 Nausea with vomiting, unspecified: Secondary | ICD-10-CM

## 2023-04-02 LAB — POCT URINALYSIS DIP (MANUAL ENTRY)
Bilirubin, UA: NEGATIVE
Glucose, UA: NEGATIVE mg/dL
Ketones, POC UA: NEGATIVE mg/dL
Nitrite, UA: NEGATIVE
Protein Ur, POC: 30 mg/dL — AB
Spec Grav, UA: 1.02 (ref 1.010–1.025)
Urobilinogen, UA: 0.2 U/dL
pH, UA: 8.5 — AB (ref 5.0–8.0)

## 2023-04-02 LAB — POCT URINE PREGNANCY: Preg Test, Ur: NEGATIVE

## 2023-04-02 MED ORDER — ONDANSETRON 4 MG PO TBDP: 4.0000 mg | ORAL_TABLET | Freq: Three times a day (TID) | ORAL | 0 refills | Status: DC | PRN

## 2023-04-02 MED ORDER — ONDANSETRON 4 MG PO TBDP
4.0000 mg | ORAL_TABLET | Freq: Once | ORAL | Status: AC
  Administered 2023-04-02: 4 mg via ORAL

## 2023-04-02 NOTE — ED Provider Notes (Signed)
[name]    CSN: [id number] Arrival date & time: 04/02/23  1033      History   Chief Complaint Chief Complaint  Patient presents with   Emesis    HPI Amber Arroyo is a 27 y.o. female.   Patient here today for evaluation of vomiting that started today.  She reports that she has vomited 3 times in the last hour.  She denies any diarrhea.  She reports some upper abdominal pain.  She has not had fever.  She does not report treatment for symptoms.  The history is provided by the patient.  Emesis Associated symptoms: abdominal pain   Associated symptoms: no chills, no diarrhea and no fever     Past Medical History:  Diagnosis Date   Allergy    eczema, seasonal allergies   Asthma    Eczema    Essential hypertension    Obesity, Class III, BMI 40-49.9 (morbid obesity) (HCC) 11/18/2020    Patient Active Problem List   Diagnosis Date Noted   Change in vision 01/03/2021   Cellulitis of right leg 11/18/2020   Sepsis (HCC) 11/18/2020   Obesity, Class III, BMI 40-49.9 (morbid obesity) (HCC) 11/18/2020   Asthma    Cellulitis 07/03/2017   Seasonal allergies 02/10/2016   Eczema 01/07/2016   Obesity 01/07/2016   Benign essential hypertension 07/17/2013   Abscess of neck 02/04/2013    Past Surgical History:  Procedure Laterality Date   INCISION AND DRAINAGE ABSCESS Left 02/04/2013   Procedure: INCISION AND DRAINAGE POSTERIOR NECK ABSCESS;  Surgeon: Flo Shanks, MD;  Location: [facility] OR;  Service: ENT;  Laterality: Left;   MOUTH SURGERY     Wisdom Teeth Extraction    OB History   No obstetric history on file.      Home Medications    Prior to Admission medications   Medication Sig Start Date End Date Taking? Authorizing Provider  ondansetron (ZOFRAN-ODT) 4 MG disintegrating tablet Take 1 tablet (4 mg total) by mouth every 8 (eight) hours as needed. 04/02/23  Yes Tomi Bamberger, PA-C  betamethasone, augmented, (DIPROLENE) 0.05 % lotion betamethasone,  augmented 0.05 % lotion  APPLY A SMALL AMOUNT TO THE SCALP ONCE A DAY    [provider]  cetirizine (ZYRTEC ALLERGY) 10 MG tablet Take 1 tablet (10 mg total) by mouth daily. 06/22/20   Hall-Potvin, [place], PA-C  EPINEPHrine 0.3 mg/0.3 mL IJ SOAJ injection See admin instructions.    [provider]  fluticasone (FLONASE) 50 MCG/ACT nasal spray Place 1-2 sprays into both nostrils daily as needed for allergies or rhinitis. 01/03/21   Eleonore Chiquito, FNP  hydrochlorothiazide (HYDRODIURIL) 12.5 MG tablet Take 1 tablet (12.5 mg total) by mouth daily. 01/03/21   Eleonore Chiquito, FNP  levocetirizine (XYZAL) 5 MG tablet 1 tablet in the evening 10/29/20   [provider]  naproxen (NAPROSYN) 500 MG tablet Take 1 tablet (500 mg total) by mouth 2 (two) times daily. 11/25/20   Wieters, Hallie C, PA-C  triamcinolone cream (KENALOG) 0.1 % APPLY EXTERNALLY TO THE AFFECTED AREA TWICE DAILY Patient taking differently: Apply 1 application topically See admin instructions. Apply to affected areas 2 times a day 02/25/18   Hoy Register, MD  VENTOLIN HFA 108 (90 Base) MCG/ACT inhaler INHALE 2 PUFFS INTO THE LUNGS EVERY 6 HOURS AS NEEDED FOR WHEEZING OR SHORTNESS OF BREATH 09/25/21   Hoy Register, MD  Vitamin D, Ergocalciferol, (DRISDOL) 1.25 MG (50000 UNIT) CAPS capsule Take 1 capsule (50,000 Units total)  by mouth every 7 (seven) days. 01/09/21   Eleonore Chiquito, FNP    Family History Family History  Problem Relation Age of Onset   Hypertension Mother    Asthma Mother    Eczema Mother    Hypertension Father     Social History Social History   Tobacco Use   Smoking status: Never   Smokeless tobacco: Never  Vaping Use   Vaping status: Never Used  Substance Use Topics   Alcohol use: No   Drug use: No     Allergies   Fish-derived products, Shellfish-derived products, and Tree extract   Review of Systems Review of Systems  Constitutional:  Negative for chills and fever.   Eyes:  Negative for discharge and redness.  Respiratory:  Negative for shortness of breath.   Gastrointestinal:  Positive for abdominal pain, nausea and vomiting. Negative for diarrhea.     Physical Exam Triage Vital Signs ED Triage Vitals  Encounter Vitals Group     BP 04/02/23 1051 128/70     Systolic BP Percentile --      Diastolic BP Percentile --      Pulse Rate 04/02/23 1051 (!) 119     Resp 04/02/23 1049 16     Temp 04/02/23 1049 98.1 F (36.7 C)     Temp Source 04/02/23 1049 Oral     SpO2 04/02/23 1049 98 %     Weight --      Height --      Head Circumference --      Peak Flow --      Pain Score 04/02/23 1050 0     Pain Loc --      Pain Education --      Exclude from Growth Chart --    No data found.  Updated Vital Signs BP 128/70 (BP Location: Left Arm)   Pulse (!) 119   Temp 98.1 F (36.7 C) (Oral)   Resp 16   LMP 03/29/2023 (Approximate)   SpO2 98%     Physical Exam Vitals and nursing note reviewed.  Constitutional:      General: She is not in acute distress.    Appearance: Normal appearance. She is not ill-appearing.  HENT:     Head: Normocephalic and atraumatic.  Eyes:     Conjunctiva/sclera: Conjunctivae normal.  Cardiovascular:     Rate and Rhythm: Regular rhythm. Tachycardia present.  Pulmonary:     Effort: Pulmonary effort is normal. No respiratory distress.     Breath sounds: No wheezing, rhonchi or rales.  Abdominal:     General: Abdomen is flat. Bowel sounds are normal. There is no distension.     Palpations: Abdomen is soft.     Tenderness: There is no abdominal tenderness. There is no guarding or rebound.  Neurological:     Mental Status: She is alert.  Psychiatric:        Mood and Affect: Mood normal.        Behavior: Behavior normal.        Thought Content: Thought content normal.      UC Treatments / Results  Labs (all labs ordered are listed, but only abnormal results are displayed) Labs Reviewed  POCT URINALYSIS DIP  (MANUAL ENTRY) - Abnormal; Notable for the following components:      Result Value   Blood, UA small (*)    pH, UA 8.5 (*)    Protein Ur, POC =30 (*)    Leukocytes, UA Small (1+) (*)  All other components within normal limits  POCT URINE PREGNANCY    EKG   Radiology No results found.  Procedures Procedures (including critical care time)  Medications Ordered in UC Medications  ondansetron (ZOFRAN-ODT) disintegrating tablet 4 mg (4 mg Oral Given 04/02/23 1119)    Initial Impression / Assessment and Plan / UC Course  I have reviewed the triage vital signs and the nursing notes.  Pertinent labs & imaging results that were available during my care of the patient were reviewed by me and considered in my medical decision making (see chart for details).    Suspect likely viral etiology of symptoms.  Will treat with Zofran and recommended follow-up if no gradual improvement or with any further concerns.  Encouraged bland diet, increase fluids.  Patient expresses understanding.  Final Clinical Impressions(s) / UC Diagnoses   Final diagnoses:  Nausea and vomiting, unspecified vomiting type   Discharge Instructions   None    ED Prescriptions     Medication Sig Dispense Auth. Provider   ondansetron (ZOFRAN-ODT) 4 MG disintegrating tablet Take 1 tablet (4 mg total) by mouth every 8 (eight) hours as needed. 20 tablet Tomi Bamberger, PA-C      PDMP not reviewed this encounter.   Tomi Bamberger, PA-C 04/02/23 1126

## 2023-04-02 NOTE — ED Triage Notes (Signed)
Pt states vomiting x 3 for the past hour.

## 2023-04-04 ENCOUNTER — Ambulatory Visit
Admission: EM | Admit: 2023-04-04 | Discharge: 2023-04-04 | Disposition: A | Discharge status: Home / Self Care | Payer: No Typology Code available for payment source

## 2023-04-04 DIAGNOSIS — T7840XA Allergy, unspecified, initial encounter: Secondary | ICD-10-CM | POA: Insufficient documentation

## 2023-04-04 DIAGNOSIS — I10 Essential (primary) hypertension: Secondary | ICD-10-CM | POA: Insufficient documentation

## 2023-04-04 DIAGNOSIS — L03115 Cellulitis of right lower limb: Secondary | ICD-10-CM | POA: Diagnosis not present

## 2023-04-04 MED ORDER — DOXYCYCLINE HYCLATE 100 MG PO CAPS
100.0000 mg | ORAL_CAPSULE | Freq: Two times a day (BID) | ORAL | 0 refills | Status: DC
Start: 2023-04-04 — End: 2023-06-28

## 2023-04-04 NOTE — ED Triage Notes (Signed)
"  I have had redness/swelling in my right lower leg". "I have eczema and history of cellulitis". "I have been having a ha on/off as well". No fever. "The redness/swelling is from below my knee to my foot".

## 2023-04-04 NOTE — ED Provider Notes (Signed)
[name]    CSN: [id number] Arrival date & time: 04/04/23  1218      History   Chief Complaint Chief Complaint  Patient presents with   Skin Problem    Right Leg    HPI Amber Arroyo is a 27 y.o. female.   Patient here today for evaluation of redness and swelling to her right lower leg that started a few days ago.  She reports that she has had some headache as well but no fever.  She states that she has some pain with walking but not constantly.  She does have history of eczema with associated cellulitis and current presentation is similar to same.  The history is provided by the patient.    Past Medical History:  Diagnosis Date   Allergy    eczema, seasonal allergies   Asthma    Eczema    Essential hypertension    Obesity, Class III, BMI 40-49.9 (morbid obesity) (HCC) 11/18/2020    Patient Active Problem List   Diagnosis Date Noted   Hypertensive disorder 04/04/2023   Allergy 04/04/2023   Change in vision 01/03/2021   Cellulitis of right leg 11/18/2020   Sepsis (HCC) 11/18/2020   Obesity, Class III, BMI 40-49.9 (morbid obesity) (HCC) 11/18/2020   Asthma    Cellulitis 07/03/2017   Seasonal allergies 02/10/2016   Eczema 01/07/2016   Obesity 01/07/2016   Benign essential hypertension 07/17/2013   Abscess of neck 02/04/2013    Past Surgical History:  Procedure Laterality Date   INCISION AND DRAINAGE ABSCESS Left 02/04/2013   Procedure: INCISION AND DRAINAGE POSTERIOR NECK ABSCESS;  Surgeon: Flo Shanks, MD;  Location: [facility] OR;  Service: ENT;  Laterality: Left;   MOUTH SURGERY     Wisdom Teeth Extraction    OB History   No obstetric history on file.      Home Medications    Prior to Admission medications   Medication Sig Start Date End Date Taking? Authorizing Provider  doxycycline (VIBRAMYCIN) 100 MG capsule Take 1 capsule (100 mg total) by mouth 2 (two) times daily. 04/04/23  Yes Tomi Bamberger, PA-C  FEROSUL 325 (65 Fe) MG tablet Take  325 mg by mouth daily. 12/14/22  Yes [provider]  hydrochlorothiazide (HYDRODIURIL) 25 MG tablet Take 25 mg by mouth daily. 03/31/23  Yes [provider]  hydrOXYzine (ATARAX) 25 MG tablet Take 25 mg by mouth as needed. 03/31/23  Yes [provider]  norethindrone (AYGESTIN) 5 MG tablet Take 5 mg by mouth 2 (two) times daily. 12/09/22  Yes [provider]  triamcinolone ointment (KENALOG) 0.1 % Apply 1 Application topically 2 (two) times daily as needed. 03/12/23  Yes [provider]  albuterol (VENTOLIN HFA) 108 (90 Base) MCG/ACT inhaler Inhale 2 puffs into the lungs every 6 (six) hours as needed.    [provider]  betamethasone, augmented, (DIPROLENE) 0.05 % lotion betamethasone, augmented 0.05 % lotion  APPLY A SMALL AMOUNT TO THE SCALP ONCE A DAY    [provider]  cetirizine (ZYRTEC ALLERGY) 10 MG tablet Take 1 tablet (10 mg total) by mouth daily. 06/22/20   Hall-Potvin, [place], PA-C  EPINEPHrine 0.3 mg/0.3 mL IJ SOAJ injection See admin instructions.    [provider]  fluticasone (CUTIVATE) 0.05 % cream Apply 1 Application topically 2 (two) times daily.    [provider]  fluticasone (FLONASE) 50 MCG/ACT nasal spray Place 1-2 sprays into both nostrils daily as needed for allergies or rhinitis.  01/03/21   Eleonore Chiquito, FNP  hydrochlorothiazide (HYDRODIURIL) 12.5 MG tablet Take 1 tablet (12.5 mg total) by mouth daily. 01/03/21   Eleonore Chiquito, FNP  levocetirizine (XYZAL) 5 MG tablet 1 tablet in the evening 10/29/20   [provider]  metroNIDAZOLE (FLAGYL) 500 MG tablet Take 500 mg by mouth 2 (two) times daily.    [provider]  naproxen (NAPROSYN) 500 MG tablet Take 1 tablet (500 mg total) by mouth 2 (two) times daily. 11/25/20   Wieters, Hallie C, PA-C  ondansetron (ZOFRAN-ODT) 4 MG disintegrating tablet Take 1 tablet (4 mg total) by mouth every 8 (eight) hours as needed. 04/02/23    Tomi Bamberger, PA-C  triamcinolone cream (KENALOG) 0.1 % APPLY EXTERNALLY TO THE AFFECTED AREA TWICE DAILY Patient taking differently: Apply 1 application topically See admin instructions. Apply to affected areas 2 times a day 02/25/18   Hoy Register, MD  VENTOLIN HFA 108 (90 Base) MCG/ACT inhaler INHALE 2 PUFFS INTO THE LUNGS EVERY 6 HOURS AS NEEDED FOR WHEEZING OR SHORTNESS OF BREATH 09/25/21   Hoy Register, MD  Vitamin D, Ergocalciferol, (DRISDOL) 1.25 MG (50000 UNIT) CAPS capsule Take 1 capsule (50,000 Units total) by mouth every 7 (seven) days. 01/09/21   Eleonore Chiquito, FNP    Family History Family History  Problem Relation Age of Onset   Hypertension Mother    Asthma Mother    Eczema Mother    Hypertension Father     Social History Social History   Tobacco Use   Smoking status: Never   Smokeless tobacco: Never  Vaping Use   Vaping status: Never Used  Substance Use Topics   Alcohol use: No   Drug use: No     Allergies   Fish-derived products, Shellfish-derived products, and Tree extract   Review of Systems Review of Systems  Constitutional:  Negative for chills and fever.  Eyes:  Negative for discharge and redness.  Respiratory:  Negative for shortness of breath.   Gastrointestinal:  Negative for abdominal pain, nausea and vomiting.  Musculoskeletal:  Positive for myalgias.  Skin:  Positive for color change.  Neurological:  Negative for numbness.     Physical Exam Triage Vital Signs ED Triage Vitals  Encounter Vitals Group     BP 04/04/23 1230 116/77     Systolic BP Percentile --      Diastolic BP Percentile --      Pulse Rate 04/04/23 1230 (!) 102     Resp 04/04/23 1230 20     Temp 04/04/23 1230 99.1 F (37.3 C)     Temp Source 04/04/23 1230 Oral     SpO2 04/04/23 1230 99 %     Weight 04/04/23 1228 277 lb (125.6 kg)     Height 04/04/23 1228 5\' 3"  (1.6 m)     Head Circumference --      Peak Flow --      Pain Score 04/04/23 1225 8     Pain  Loc --      Pain Education --      Exclude from Growth Chart --    No data found.  Updated Vital Signs BP 116/77 (BP Location: Left Arm)   Pulse (!) 102   Temp 99.1 F (37.3 C) (Oral)   Resp 20   Ht 5\' 3"  (1.6 m)   Wt 277 lb (125.6 kg)   LMP 03/29/2023 (Approximate)   SpO2 99%   BMI 49.07 kg/m       Physical  Exam Vitals and nursing note reviewed.  Constitutional:      General: She is not in acute distress.    Appearance: Normal appearance. She is not ill-appearing.  HENT:     Head: Normocephalic and atraumatic.  Eyes:     Conjunctiva/sclera: Conjunctivae normal.  Cardiovascular:     Rate and Rhythm: Normal rate.  Pulmonary:     Effort: Pulmonary effort is normal. No respiratory distress.  Musculoskeletal:     Comments: Mild swelling and erythema noted to right lateral lower leg, normal coloration of right foot  Neurological:     Mental Status: She is alert.  Psychiatric:        Mood and Affect: Mood normal.        Behavior: Behavior normal.        Thought Content: Thought content normal.      UC Treatments / Results  Labs (all labs ordered are listed, but only abnormal results are displayed) Labs Reviewed - No data to display  EKG   Radiology No results found.  Procedures Procedures (including critical care time)  Medications Ordered in UC Medications - No data to display  Initial Impression / Assessment and Plan / UC Course  I have reviewed the triage vital signs and the nursing notes.  Pertinent labs & imaging results that were available during my care of the patient were reviewed by me and considered in my medical decision making (see chart for details).    Suspect cellulitis and will treat with doxycycline.  Recommended follow-up if no gradual improvement with any further concerns.  Advised emergency room evaluation with any worsening symptoms.  Final Clinical Impressions(s) / UC Diagnoses   Final diagnoses:  Cellulitis of leg, right    Discharge Instructions   None    ED Prescriptions     Medication Sig Dispense Auth. Provider   doxycycline (VIBRAMYCIN) 100 MG capsule Take 1 capsule (100 mg total) by mouth 2 (two) times daily. 20 capsule Tomi Bamberger, PA-C      PDMP not reviewed this encounter.   Tomi Bamberger, PA-C 04/04/23 1454

## 2023-04-06 ENCOUNTER — Inpatient Hospital Stay (HOSPITAL_COMMUNITY)
Admission: EM | Admit: 2023-04-06 | Discharge: 2023-04-08 | DRG: 603 | Disposition: A | Discharge status: Home / Self Care | Payer: No Typology Code available for payment source | Attending: Internal Medicine | Admitting: Internal Medicine

## 2023-04-06 ENCOUNTER — Other Ambulatory Visit: Payer: Self-pay

## 2023-04-06 ENCOUNTER — Encounter (HOSPITAL_COMMUNITY): Payer: Self-pay | Admitting: Emergency Medicine

## 2023-04-06 ENCOUNTER — Emergency Department (HOSPITAL_COMMUNITY): Payer: No Typology Code available for payment source

## 2023-04-06 DIAGNOSIS — E66813 Obesity, class 3: Secondary | ICD-10-CM | POA: Diagnosis present

## 2023-04-06 DIAGNOSIS — K5900 Constipation, unspecified: Secondary | ICD-10-CM | POA: Diagnosis present

## 2023-04-06 DIAGNOSIS — E669 Obesity, unspecified: Secondary | ICD-10-CM | POA: Diagnosis not present

## 2023-04-06 DIAGNOSIS — D649 Anemia, unspecified: Secondary | ICD-10-CM | POA: Diagnosis not present

## 2023-04-06 DIAGNOSIS — J45909 Unspecified asthma, uncomplicated: Secondary | ICD-10-CM | POA: Diagnosis present

## 2023-04-06 DIAGNOSIS — N939 Abnormal uterine and vaginal bleeding, unspecified: Secondary | ICD-10-CM | POA: Diagnosis present

## 2023-04-06 DIAGNOSIS — D500 Iron deficiency anemia secondary to blood loss (chronic): Secondary | ICD-10-CM | POA: Diagnosis present

## 2023-04-06 DIAGNOSIS — L0390 Cellulitis, unspecified: Principal | ICD-10-CM | POA: Diagnosis present

## 2023-04-06 DIAGNOSIS — L03115 Cellulitis of right lower limb: Secondary | ICD-10-CM | POA: Diagnosis present

## 2023-04-06 DIAGNOSIS — Z91013 Allergy to seafood: Secondary | ICD-10-CM | POA: Diagnosis not present

## 2023-04-06 DIAGNOSIS — Z8249 Family history of ischemic heart disease and other diseases of the circulatory system: Secondary | ICD-10-CM | POA: Diagnosis not present

## 2023-04-06 DIAGNOSIS — Z825 Family history of asthma and other chronic lower respiratory diseases: Secondary | ICD-10-CM

## 2023-04-06 DIAGNOSIS — Z6841 Body Mass Index (BMI) 40.0 and over, adult: Secondary | ICD-10-CM

## 2023-04-06 DIAGNOSIS — I10 Essential (primary) hypertension: Secondary | ICD-10-CM | POA: Diagnosis present

## 2023-04-06 DIAGNOSIS — Z9109 Other allergy status, other than to drugs and biological substances: Secondary | ICD-10-CM | POA: Diagnosis not present

## 2023-04-06 DIAGNOSIS — R609 Edema, unspecified: Secondary | ICD-10-CM | POA: Diagnosis not present

## 2023-04-06 LAB — ABO/RH: ABO/RH(D): A POS

## 2023-04-06 LAB — RETICULOCYTES
Immature Retic Fract: 14 % (ref 2.3–15.9)
RBC.: 3.86 MIL/uL — ABNORMAL LOW (ref 3.87–5.11)
Retic Count, Absolute: 16.6 10*3/uL — ABNORMAL LOW (ref 19.0–186.0)
Retic Ct Pct: 0.5 % (ref 0.4–3.1)

## 2023-04-06 LAB — CBC WITH DIFFERENTIAL/PLATELET
Abs Immature Granulocytes: 0.05 10*3/uL (ref 0.00–0.07)
Abs Immature Granulocytes: 0.05 10*3/uL (ref 0.00–0.07)
Basophils Absolute: 0 10*3/uL (ref 0.0–0.1)
Basophils Absolute: 0 10*3/uL (ref 0.0–0.1)
Basophils Relative: 1 %
Basophils Relative: 1 %
Eosinophils Absolute: 0.1 10*3/uL (ref 0.0–0.5)
Eosinophils Absolute: 0.2 10*3/uL (ref 0.0–0.5)
Eosinophils Relative: 3 %
Eosinophils Relative: 3 %
HCT: 24.8 % — ABNORMAL LOW (ref 36.0–46.0)
HCT: 26.4 % — ABNORMAL LOW (ref 36.0–46.0)
Hemoglobin: 6.9 g/dL — CL (ref 12.0–15.0)
Hemoglobin: 7.2 g/dL — ABNORMAL LOW (ref 12.0–15.0)
Immature Granulocytes: 1 %
Immature Granulocytes: 1 %
Lymphocytes Relative: 33 %
Lymphocytes Relative: 34 %
Lymphs Abs: 1.5 10*3/uL (ref 0.7–4.0)
Lymphs Abs: 2 10*3/uL (ref 0.7–4.0)
MCH: 17.5 pg — ABNORMAL LOW (ref 26.0–34.0)
MCH: 17.7 pg — ABNORMAL LOW (ref 26.0–34.0)
MCHC: 27.8 g/dL — ABNORMAL LOW (ref 30.0–36.0)
MCHC: 27.3 g/dL — ABNORMAL LOW (ref 30.0–36.0)
MCV: 62.9 fL — ABNORMAL LOW (ref 80.0–100.0)
MCV: 64.9 fL — ABNORMAL LOW (ref 80.0–100.0)
Monocytes Absolute: 0.5 10*3/uL (ref 0.1–1.0)
Monocytes Absolute: 0.7 10*3/uL (ref 0.1–1.0)
Monocytes Relative: 11 %
Monocytes Relative: 12 %
Neutro Abs: 2.3 10*3/uL (ref 1.7–7.7)
Neutro Abs: 2.8 10*3/uL (ref 1.7–7.7)
Neutrophils Relative %: 51 %
Neutrophils Relative %: 49 %
Platelets: 344 10*3/uL (ref 150–400)
Platelets: 346 10*3/uL (ref 150–400)
RBC: 3.94 MIL/uL (ref 3.87–5.11)
RBC: 4.07 MIL/uL (ref 3.87–5.11)
RDW: 19 % — ABNORMAL HIGH (ref 11.5–15.5)
RDW: 19.2 % — ABNORMAL HIGH (ref 11.5–15.5)
WBC: 4.5 10*3/uL (ref 4.0–10.5)
WBC: 5.7 10*3/uL (ref 4.0–10.5)
nRBC: 0.4 % — ABNORMAL HIGH (ref 0.0–0.2)
nRBC: 0.3 % — ABNORMAL HIGH (ref 0.0–0.2)

## 2023-04-06 LAB — I-STAT CHEM 8, ED
BUN: 12 mg/dL (ref 6–20)
Calcium, Ion: 1.19 mmol/L (ref 1.15–1.40)
Chloride: 106 mmol/L (ref 98–111)
Creatinine, Ser: 0.7 mg/dL (ref 0.44–1.00)
Glucose, Bld: 88 mg/dL (ref 70–99)
HCT: 25 % — ABNORMAL LOW (ref 36.0–46.0)
Hemoglobin: 8.5 g/dL — ABNORMAL LOW (ref 12.0–15.0)
Potassium: 3.6 mmol/L (ref 3.5–5.1)
Sodium: 144 mmol/L (ref 135–145)
TCO2: 27 mmol/L (ref 22–32)

## 2023-04-06 LAB — COMPREHENSIVE METABOLIC PANEL
ALT: 15 U/L (ref 0–44)
AST: 17 U/L (ref 15–41)
Albumin: 3.2 g/dL — ABNORMAL LOW (ref 3.5–5.0)
Alkaline Phosphatase: 41 U/L (ref 38–126)
Anion gap: 9 (ref 5–15)
BUN: 12 mg/dL (ref 6–20)
CO2: 26 mmol/L (ref 22–32)
Calcium: 9.1 mg/dL (ref 8.9–10.3)
Chloride: 107 mmol/L (ref 98–111)
Creatinine, Ser: 0.71 mg/dL (ref 0.44–1.00)
GFR, Estimated: >60 mL/min (ref >60)
Glucose, Bld: 104 mg/dL — ABNORMAL HIGH (ref 70–99)
Potassium: 3.5 mmol/L (ref 3.5–5.1)
Sodium: 142 mmol/L (ref 135–145)
Total Bilirubin: <0.1 mg/dL — ABNORMAL LOW (ref 0.3–1.2)
Total Protein: 7.1 g/dL (ref 6.5–8.1)

## 2023-04-06 LAB — FERRITIN: Ferritin: 17 ng/mL (ref 11–307)

## 2023-04-06 LAB — IRON AND TIBC
Iron: 12 ug/dL — ABNORMAL LOW (ref 28–170)
Saturation Ratios: 3 % — ABNORMAL LOW (ref 10.4–31.8)
TIBC: 431 ug/dL (ref 250–450)
UIBC: 419 ug/dL

## 2023-04-06 LAB — MAGNESIUM: Magnesium: 2.1 mg/dL (ref 1.7–2.4)

## 2023-04-06 LAB — CK: Total CK: 113 U/L (ref 38–234)

## 2023-04-06 LAB — HCG, SERUM, QUALITATIVE: Preg, Serum: NEGATIVE

## 2023-04-06 LAB — TSH: TSH: 3.128 u[IU]/mL (ref 0.350–4.500)

## 2023-04-06 LAB — I-STAT CG4 LACTIC ACID, ED
Lactic Acid, Venous: 0.7 mmol/L (ref 0.5–1.9)
Lactic Acid, Venous: 1 mmol/L (ref 0.5–1.9)

## 2023-04-06 LAB — POC OCCULT BLOOD, ED: Fecal Occult Bld: NEGATIVE

## 2023-04-06 LAB — PHOSPHORUS: Phosphorus: 3.3 mg/dL (ref 2.5–4.6)

## 2023-04-06 LAB — PROCALCITONIN: Procalcitonin: 0.13 ng/mL

## 2023-04-06 MED ORDER — ACETAMINOPHEN 650 MG RE SUPP: 650.0000 mg | Freq: Four times a day (QID) | RECTAL | Status: DC | PRN

## 2023-04-06 MED ORDER — ACETAMINOPHEN 325 MG PO TABS: 650.0000 mg | ORAL_TABLET | Freq: Four times a day (QID) | ORAL | Status: DC | PRN

## 2023-04-06 MED ORDER — SODIUM CHLORIDE 0.9 % IV SOLN
2.0000 g | Freq: Once | INTRAVENOUS | Status: AC
  Administered 2023-04-06: 2 g via INTRAVENOUS
  Filled 2023-04-06: qty 20

## 2023-04-06 MED ORDER — ONDANSETRON HCL 4 MG/2ML IJ SOLN: 4.0000 mg | Freq: Four times a day (QID) | INTRAMUSCULAR | Status: DC | PRN

## 2023-04-06 MED ORDER — HYDROCODONE-ACETAMINOPHEN 5-325 MG PO TABS: 1.0000 | ORAL_TABLET | ORAL | Status: DC | PRN

## 2023-04-06 MED ORDER — FENTANYL CITRATE PF 50 MCG/ML IJ SOSY: 12.5000 ug | PREFILLED_SYRINGE | INTRAMUSCULAR | Status: DC | PRN

## 2023-04-06 MED ORDER — ONDANSETRON HCL 4 MG PO TABS: 4.0000 mg | ORAL_TABLET | Freq: Four times a day (QID) | ORAL | Status: DC | PRN

## 2023-04-06 MED ORDER — ALBUTEROL SULFATE (2.5 MG/3ML) 0.083% IN NEBU: 2.5000 mg | INHALATION_SOLUTION | RESPIRATORY_TRACT | Status: DC | PRN

## 2023-04-06 MED ORDER — SODIUM CHLORIDE 0.9 % IV SOLN
2.0000 g | INTRAVENOUS | Status: DC
  Administered 2023-04-07: 2 g via INTRAVENOUS
  Filled 2023-04-06: qty 20

## 2023-04-06 NOTE — Assessment & Plan Note (Addendum)
Patient states her OB/GYN has told her that she has anemia in the past and they were concerned she has iron deficiency.  Obtain anemia panel patient may benefit from iron infusion transfuse if hemoglobin below 7 or if patient is is symptomatic patient denies any symptoms at this time Denies any bleeding, no heavy menses no blood per rectum Hemoccult negative Patient is agreeable for blood transfusion if needed

## 2023-04-06 NOTE — Assessment & Plan Note (Addendum)
Will need to follow-up as an outpatient with nutritional consult Significant obesity does complicate management

## 2023-04-06 NOTE — Subjective & Objective (Signed)
Patient has been on antibiotics for cellulitis since Sunday but yesterday noticed increased redness and swelling Right lower extremity is affected associated headache no fever pain with walking history of eczema and prior cellulitis as well as obesity

## 2023-04-06 NOTE — Assessment & Plan Note (Signed)
Soft blood pressure hold hydrochlorothiazide for tonight

## 2023-04-06 NOTE — ED Triage Notes (Signed)
Pt reports taking PO antibiotics for cellulitis since Sunday. Pt reports worked yesterday and noted increased reddness and swelling. VSS. NAD at present.

## 2023-04-06 NOTE — Assessment & Plan Note (Signed)
-  admit per  cellulitis protocol will      On rocephin      plain films ordered  Order DVT study      Will obtain MRSA screening,       obtain blood cultures  if febrile or septic     further antibiotic adjustment pending above results

## 2023-04-06 NOTE — H&P (Signed)
Maidie Streight XBJ:[id number] DOB: 10-17-1995 DOA: 04/06/2023     PCP: Salvatore Decent, PA-C      Patient arrived to ER on 04/06/23 at 1446 Referred by Attending Royanne Foots, DO   Patient coming from:    home Lives alone,     Chief Complaint:   Chief Complaint  Patient presents with   Cellulitis    HPI: Nirel Babler is a 27 y.o. female with medical history significant of obesity, cellulitis, anemia    Presented with  right leg pain  3 days Patient has been on antibiotics for cellulitis since Sunday but yesterday noticed increased redness and swelling Right lower extremity is affected associated headache no fever pain with walking history of eczema and prior cellulitis as well as obesity  Last period was on the October 7, period lasts 5 dys and she only changes 3 pads  Not on birth control  No black stool no blood per rectum   States no heavy periods , iron deficiency   Denies significant ETOH intake   Does not smoke   Lab Results  Component Value Date   SARSCOV2NAA NEGATIVE 11/18/2020   SARSCOV2NAA Not Detected 06/22/2020   SARSCOV2NAA Not Detected 03/18/2020   SARSCOV2NAA Not Detected 01/19/2020        Regarding pertinent Chronic problems:       HTN on      Morbid obesity-   BMI Readings from Last 1 Encounters:  04/06/23 49.07 kg/m       Asthma -well   controlled on home inhalers/ nebs                     Chronic anemia - baseline hg Hemoglobin & Hematocrit  Recent Labs    04/06/23 1523 04/06/23 1747 04/06/23 1757  HGB 6.9* 7.2* 8.5*      While in ER:     Lab Orders         Comprehensive metabolic panel         CBC with Differential         hCG, serum, qualitative         CBC with Differential         I-Stat Lactic Acid, ED         POC occult blood, ED         I-stat chem 8, ED (not at [facility], [facility] or [facility])      Right tibia  Following Medications were ordered in ER: Medications  cefTRIAXone (ROCEPHIN) 2 g in sodium chloride 0.9 % 100 mL  IVPB (0 g Intravenous Stopped 04/06/23 1853)       ED Triage Vitals  Encounter Vitals Group     BP 04/06/23 1502 118/60     Systolic BP Percentile --      Diastolic BP Percentile --      Pulse Rate 04/06/23 1502 96     Resp 04/06/23 1502 18     Temp 04/06/23 1502 98.7 F (37.1 C)     Temp Source 04/06/23 1502 Oral     SpO2 04/06/23 1502 100 %     Weight 04/06/23 1521 277 lb (125.6 kg)     Height 04/06/23 1521 5\' 3"  (1.6 m)     Head Circumference --      Peak Flow --      Pain Score 04/06/23 1520 0     Pain Loc --      Pain Education --  Exclude from Growth Chart --   WUJW(11)@     _________________________________________ Significant initial  Findings: Abnormal Labs Reviewed  COMPREHENSIVE METABOLIC PANEL - Abnormal; Notable for the following components:      Result Value   Glucose, Bld 104 (*)    Albumin 3.2 (*)    Total Bilirubin <0.1 (*)    All other components within normal limits  CBC WITH DIFFERENTIAL/PLATELET - Abnormal; Notable for the following components:   Hemoglobin 6.9 (*)    HCT 24.8 (*)    MCV 62.9 (*)    MCH 17.5 (*)    MCHC 27.8 (*)    RDW 19.0 (*)    nRBC 0.4 (*)    All other components within normal limits  CBC WITH DIFFERENTIAL/PLATELET - Abnormal; Notable for the following components:   Hemoglobin 7.2 (*)    HCT 26.4 (*)    MCV 64.9 (*)    MCH 17.7 (*)    MCHC 27.3 (*)    RDW 19.2 (*)    nRBC 0.3 (*)    All other components within normal limits  I-STAT CHEM 8, ED - Abnormal; Notable for the following components:   Hemoglobin 8.5 (*)    HCT 25.0 (*)    All other components within normal limits       ECG: Ordered Personally reviewed and interpreted by me showing: HR : 72 Rhythm: Sinus rhythm Borderline left axis deviation RSR' in V1 or V2, probably normal variant Borderline T abnormalities, anterior leads Borderline prolonged QT interval QTC 479   COVID-19 Labs  No results for input(s): "DDIMER", "FERRITIN", "LDH", "CRP" in  the last 72 hours.  Lab Results  Component Value Date   SARSCOV2NAA NEGATIVE 11/18/2020   SARSCOV2NAA Not Detected 06/22/2020   SARSCOV2NAA Not Detected 03/18/2020   SARSCOV2NAA Not Detected 01/19/2020     The recent clinical data is shown below. Vitals:   04/06/23 1800 04/06/23 1804 04/06/23 1830 04/06/23 1845  BP: (!) 132/59 (!) 132/59 131/60 119/72  Pulse: 73 67 77 82  Resp:  16    Temp:  98.4 F (36.9 C)    TempSrc:  Oral    SpO2: 100% 100% 100% 100%  Weight:      Height:        WBC     Component Value Date/Time   WBC 5.7 04/06/2023 1747   LYMPHSABS 2.0 04/06/2023 1747   LYMPHSABS 1.5 01/03/2021 1454   MONOABS 0.7 04/06/2023 1747   EOSABS 0.2 04/06/2023 1747   EOSABS 0.1 01/03/2021 1454   BASOSABS 0.0 04/06/2023 1747   BASOSABS 0.0 01/03/2021 1454    Lactic Acid, Venous    Component Value Date/Time   LATICACIDVEN 1.0 04/06/2023 1754      UA  ordered    ABX started Antibiotics Given (last 72 hours)     Date/Time Action Medication Dose Rate   04/06/23 1807 New Bag/Given   cefTRIAXone (ROCEPHIN) 2 g in sodium chloride 0.9 % 100 mL IVPB 2 g 200 mL/hr        __________________________________________________________ Recent Labs  Lab 04/06/23 1523 04/06/23 1757  NA 142 144  K 3.5 3.6  CO2 26  --   GLUCOSE 104* 88  BUN 12 12  CREATININE 0.71 0.70  CALCIUM 9.1  --     Cr   stable,    Lab Results  Component Value Date   CREATININE 0.70 04/06/2023   CREATININE 0.71 04/06/2023   CREATININE 0.94 01/03/2021    Recent Labs  Lab 04/06/23 1523  AST 17  ALT 15  ALKPHOS 41  BILITOT <0.1*  PROT 7.1  ALBUMIN 3.2*   Lab Results  Component Value Date   CALCIUM 9.1 04/06/2023    Plt: Lab Results  Component Value Date   PLT 346 04/06/2023       Recent Labs  Lab 04/06/23 1523 04/06/23 1747 04/06/23 1757  WBC 4.5 5.7  --   NEUTROABS 2.3 2.8  --   HGB 6.9* 7.2* 8.5*  HCT 24.8* 26.4* 25.0*  MCV 62.9* 64.9*  --   PLT 344 346  --      HG/HCT  stable,     Component Value Date/Time   HGB 8.5 (L) 04/06/2023 1757   HGB 11.1 01/03/2021 1454   HCT 25.0 (L) 04/06/2023 1757   HCT 34.3 01/03/2021 1454   MCV 64.9 (L) 04/06/2023 1747   MCV 77 (L) 01/03/2021 1454     Hospitalist was called for admission for rigiht leg cellulitis   The following Work up has been ordered so far:  Orders Placed This Encounter  Procedures   Comprehensive metabolic panel   CBC with Differential   hCG, serum, qualitative   CBC with Differential   Consult to hospitalist   I-Stat Lactic Acid, ED   POC occult blood, ED   I-stat chem 8, ED (not at [facility], [facility] or [facility])          Cultures:    Component Value Date/Time   SDES  11/18/2020 1530    BLOOD RIGHT FOREARM Performed at [facility], [address] [place], [place] [address]    SDES  11/18/2020 1530    BLOOD SITE NOT SPECIFIED Performed at [facility], [address], [place], [place] [address]    SPECREQUEST  11/18/2020 1530    BOTTLES DRAWN AEROBIC AND ANAEROBIC Blood Culture results may not be optimal due to an inadequate volume of blood received in culture bottles Performed at [facility], [address], [place], [place] [address]    SPECREQUEST  11/18/2020 1530    BOTTLES DRAWN AEROBIC AND ANAEROBIC Blood Culture results may not be optimal due to an inadequate volume of blood received in culture bottles Performed at [facility], [address] [place], [place] [address]    CULT  11/18/2020 1530    NO GROWTH 5 DAYS Performed at [facility], [address], [place], [place] [address]    CULT  11/18/2020 1530    NO GROWTH 5 DAYS Performed at [facility], [address] [place], [place] [address]    REPTSTATUS 11/23/2020 FINAL 11/18/2020 1530   REPTSTATUS 11/23/2020 FINAL 11/18/2020 1530     Radiological Exams on Admission: No results  found. _______________________________________________________________________________________________________ Latest  Blood pressure 119/72, pulse 82, temperature 98.4 F (36.9 C), temperature source Oral, resp. rate 16, height 5\' 3"  (1.6 m), weight 125.6 kg, last menstrual period 03/29/2023, SpO2 100%.   Vitals  labs and radiology finding personally reviewed  Review of Systems:    Pertinent positives include: right leg pain and swelling  Constitutional:  No weight loss, night sweats, Fevers, chills, fatigue, weight loss  HEENT:  No headaches, Difficulty swallowing,Tooth/dental problems,Sore throat,  No sneezing, itching, ear ache, nasal congestion, post nasal drip,  Cardio-vascular:  No chest pain, Orthopnea, PND, anasarca, dizziness, palpitations.no Bilateral lower extremity swelling  GI:  No heartburn, indigestion, abdominal pain, nausea, vomiting, diarrhea, change in bowel habits, loss  of appetite, melena, blood in stool, hematemesis Resp:  no shortness of breath at rest. No dyspnea on exertion, No excess mucus, no productive cough, No non-productive cough, No coughing up of blood.No change in color of mucus.No wheezing. Skin:  no rash or lesions. No jaundice GU:  no dysuria, change in color of urine, no urgency or frequency. No straining to urinate.  No flank pain.  Musculoskeletal:  No joint pain or no joint swelling. No decreased range of motion. No back pain.  Psych:  No change in mood or affect. No depression or anxiety. No memory loss.  Neuro: no localizing neurological complaints, no tingling, no weakness, no double vision, no gait abnormality, no slurred speech, no confusion  All systems reviewed and apart from HOPI all are negative _______________________________________________________________________________________________ Past Medical History:   Past Medical History:  Diagnosis Date   Allergy    eczema, seasonal allergies   Asthma    Eczema    Essential  hypertension    Obesity, Class III, BMI 40-49.9 (morbid obesity) (HCC) 11/18/2020     Past Surgical History:  Procedure Laterality Date   INCISION AND DRAINAGE ABSCESS Left 02/04/2013   Procedure: INCISION AND DRAINAGE POSTERIOR NECK ABSCESS;  Surgeon: Flo Shanks, MD;  Location: [facility] OR;  Service: ENT;  Laterality: Left;   MOUTH SURGERY     Wisdom Teeth Extraction    Social History:  Ambulatory   independently      reports that she has never smoked. She has never used smokeless tobacco. She reports that she does not drink alcohol and does not use drugs.   Family History:   Family History  Problem Relation Age of Onset   Hypertension Mother    Asthma Mother    Eczema Mother    Hypertension Father    ______________________________________________________________________________________________ Allergies: Allergies  Allergen Reactions   Fish-Derived Products Shortness Of Breath, Swelling and Other (See Comments)    Lips swell   Shellfish-Derived Products Shortness Of Breath, Swelling and Other (See Comments)    Lips swell   Tree Extract Other (See Comments)    Patient was told she is allergic to trees     Prior to Admission medications   Medication Sig Start Date End Date Taking? Authorizing Provider  albuterol (VENTOLIN HFA) 108 (90 Base) MCG/ACT inhaler Inhale 2 puffs into the lungs every 6 (six) hours as needed.    [provider]  betamethasone, augmented, (DIPROLENE) 0.05 % lotion betamethasone, augmented 0.05 % lotion  APPLY A SMALL AMOUNT TO THE SCALP ONCE A DAY    [provider]  cetirizine (ZYRTEC ALLERGY) 10 MG tablet Take 1 tablet (10 mg total) by mouth daily. 06/22/20   Hall-Potvin, [place], PA-C  doxycycline (VIBRAMYCIN) 100 MG capsule Take 1 capsule (100 mg total) by mouth 2 (two) times daily. 04/04/23   Tomi Bamberger, PA-C  EPINEPHrine 0.3 mg/0.3 mL IJ SOAJ injection See admin instructions.    [provider]  FEROSUL 325 (65  Fe) MG tablet Take 325 mg by mouth daily. 12/14/22   [provider]  fluticasone (CUTIVATE) 0.05 % cream Apply 1 Application topically 2 (two) times daily.    [provider]  fluticasone (FLONASE) 50 MCG/ACT nasal spray Place 1-2 sprays into both nostrils daily as needed for allergies or rhinitis. 01/03/21   Eleonore Chiquito, FNP  hydrochlorothiazide (HYDRODIURIL) 12.5 MG tablet Take 1 tablet (12.5 mg total) by mouth daily. 01/03/21   Eleonore Chiquito, FNP  hydrochlorothiazide (HYDRODIURIL) 25 MG tablet  Take 25 mg by mouth daily. 03/31/23   [provider]  hydrOXYzine (ATARAX) 25 MG tablet Take 25 mg by mouth as needed. 03/31/23   [provider]  levocetirizine (XYZAL) 5 MG tablet 1 tablet in the evening 10/29/20   [provider]  metroNIDAZOLE (FLAGYL) 500 MG tablet Take 500 mg by mouth 2 (two) times daily.    [provider]  naproxen (NAPROSYN) 500 MG tablet Take 1 tablet (500 mg total) by mouth 2 (two) times daily. 11/25/20   Wieters, Hallie C, PA-C  norethindrone (AYGESTIN) 5 MG tablet Take 5 mg by mouth 2 (two) times daily. 12/09/22   [provider]  ondansetron (ZOFRAN-ODT) 4 MG disintegrating tablet Take 1 tablet (4 mg total) by mouth every 8 (eight) hours as needed. 04/02/23   Tomi Bamberger, PA-C  triamcinolone cream (KENALOG) 0.1 % APPLY EXTERNALLY TO THE AFFECTED AREA TWICE DAILY Patient taking differently: Apply 1 application topically See admin instructions. Apply to affected areas 2 times a day 02/25/18   Hoy Register, MD  triamcinolone ointment (KENALOG) 0.1 % Apply 1 Application topically 2 (two) times daily as needed. 03/12/23   [provider]  VENTOLIN HFA 108 (90 Base) MCG/ACT inhaler INHALE 2 PUFFS INTO THE LUNGS EVERY 6 HOURS AS NEEDED FOR WHEEZING OR SHORTNESS OF BREATH 09/25/21   Hoy Register, MD  Vitamin D, Ergocalciferol, (DRISDOL) 1.25 MG (50000 UNIT) CAPS capsule Take 1 capsule (50,000 Units total) by  mouth every 7 (seven) days. 01/09/21   Eleonore Chiquito, FNP    ___________________________________________________________________________________________________ Physical Exam:    04/06/2023    6:45 PM 04/06/2023    6:30 PM 04/06/2023    6:04 PM  Vitals with BMI  Systolic 119 131 782  Diastolic 72 60 59  Pulse 82 77 67     1. General:  in No  Acute distress    Well appearing 2. Psychological: Alert and   Oriented 3. Head/ENT:    Dry Mucous Membranes                          Head Non traumatic, neck supple                          Normal   Dentition 4. SKIN:  decreased Skin turgor,  Skin clean Dry      5. Heart: Regular rate and rhythm no  Murmur, no Rub or gallop 6. Lungs:  no wheezes or crackles   7. Abdomen: Soft,  non-tender, Non distended   obese  bowel sounds present 8. Lower extremities: no clubbing, cyanosis, edema right > left 9. Neurologically Grossly intact, moving all 4 extremities equally   10. MSK: Normal range of motion    Chart has been reviewed  ______________________________________________________________________________________________  Assessment/Plan 27 y.o. female with medical history significant of obesity, cellulitis, anemia  Admitted for right leg cellulitis and anemia   Present on Admission:  Cellulitis  Obesity  Essential hypertension  Anemia    Cellulitis -admit per  cellulitis protocol will      On rocephin      plain films ordered  Order DVT study      Will obtain MRSA screening,       obtain blood cultures  if febrile or septic     further antibiotic adjustment pending above results   Obesity Will need to follow-up as an outpatient with nutritional consult Significant obesity  does complicate management  Essential hypertension Soft blood pressure hold hydrochlorothiazide for tonight  Anemia Patient states her OB/GYN has told her that she has anemia in the past and they were concerned she has iron deficiency.  Obtain  anemia panel patient may benefit from iron infusion transfuse if hemoglobin below 7 or if patient is is symptomatic patient denies any symptoms at this time Denies any bleeding, no heavy menses no blood per rectum Hemoccult negative Patient is agreeable for blood transfusion if needed   Other plan as per orders.  DVT prophylaxis:  SCD    Code Status:    Code Status: Prior FULL CODE   as per patient    I had personally discussed CODE STATUS with patient   ACP   none    Family Communication:   Family not at  Bedside    Diet  heart halthy   Disposition Plan:     To home once workup is complete and patient is stable   Following barriers for discharge:                                                          Pain controlled with PO medications                                     Consult Orders  (From admission, onward)           Start     Ordered   04/06/23 1902  Consult to hospitalist  Pg pg Viviann Spare  Once       Provider:  (Not yet assigned)  Question Answer Comment  Place call to: [facility]   Reason for Consult Admit      04/06/23 1901               Consults called: none     Admission status:  ED Disposition     ED Disposition  Admit   Condition  --   Comment  Hospital Area: [facility] [[id number]]  Level of Care: Telemetry Medical [[id number]]  May admit patient to [facility] or [facility] if equivalent level of care is available:: No  Covid Evaluation: Asymptomatic - no recent exposure (last 10 days) testing not required  Diagnosis: Cellulitis [284132]  Admitting Physician: Therisa Doyne [[id number]]  Attending Physician: Therisa Doyne [[id number]]  Certification:: I certify this patient will need inpatient services for at least 2 midnights  Expected Medical Readiness: 04/09/2023              inpatient     I Expect 2 midnight stay secondary to severity of patient's current illness need for inpatient interventions justified by the  following:    Severe lab/radiological/exam abnormalities including:    Right leg infection   and extensive comorbidities including:  Morbid Obesity   That are currently affecting medical management.   I expect  patient to be hospitalized for 2 midnights requiring inpatient medical care.  Patient is at high risk for adverse outcome (such as loss of life or disability) if not treated.  Indication for inpatient stay as follows:   severe pain requiring acute inpatient management,   Need for IV antibiotics, IV fluids   Level of  care     tele    24H     Libbie Bartley 04/06/2023, 8:59 PM    [facility]     after 2 AM please page floor coverage PA If 7AM-7PM, please contact the day team taking care of the patient using [email or web address]

## 2023-04-06 NOTE — ED Notes (Addendum)
ED TO INPATIENT HANDOFF REPORT  ED Nurse Name and Phone #: Amil Amen [phone]  S Name/Age/Gender Amber Arroyo 27 y.o. female Room/Bed: 038C/038C  Code Status   Code Status: Full Code  Home/SNF/Other Home Patient oriented to: self, place, time, and situation Is this baseline? Yes   Triage Complete: Triage complete  Chief Complaint Cellulitis [L03.90]  Triage Note Pt reports taking PO antibiotics for cellulitis since Sunday. Pt reports worked yesterday and noted increased reddness and swelling. VSS. NAD at present.    Allergies Allergies  Allergen Reactions   Fish-Derived Products Shortness Of Breath, Swelling and Other (See Comments)    Lips swell   Shellfish-Derived Products Shortness Of Breath, Swelling and Other (See Comments)    Lips swell   Tree Extract Other (See Comments)    Patient was told she is allergic to trees    Level of Care/Admitting Diagnosis ED Disposition     ED Disposition  Admit   Condition  --   Comment  Hospital Area: [facility] [[id number]]  Level of Care: Telemetry Medical [[id number]]  May admit patient to [facility] or [facility] if equivalent level of care is available:: No  Covid Evaluation: Asymptomatic - no recent exposure (last 10 days) testing not required  Diagnosis: Cellulitis [829562]  Admitting Physician: Therisa Doyne [[id number]]  Attending Physician: Therisa Doyne [[id number]]  Certification:: I certify this patient will need inpatient services for at least 2 midnights  Expected Medical Readiness: 04/09/2023          B Medical/Surgery History Past Medical History:  Diagnosis Date   Allergy    eczema, seasonal allergies   Asthma    Eczema    Essential hypertension    Obesity, Class III, BMI 40-49.9 (morbid obesity) (HCC) 11/18/2020   Past Surgical History:  Procedure Laterality Date   INCISION AND DRAINAGE ABSCESS Left 02/04/2013   Procedure: INCISION AND DRAINAGE POSTERIOR NECK ABSCESS;  Surgeon: Flo Shanks, MD;  Location: [facility] OR;  Service: ENT;  Laterality: Left;   MOUTH SURGERY     Wisdom Teeth Extraction     A IV Location/Drains/Wounds Patient Lines/Drains/Airways Status     Active Line/Drains/Airways     Name Placement date Placement time Site Days   Peripheral IV 04/06/23 20 G Right Antecubital 04/06/23  1753  Antecubital  less than 1            Intake/Output Last 24 hours No intake or output data in the 24 hours ending 04/06/23 2055  Labs/Imaging Results for orders placed or performed during the hospital encounter of 04/06/23 (from the past 48 hour(s))  Comprehensive metabolic panel     Status: Abnormal   Collection Time: 04/06/23  3:23 PM  Result Value Ref Range   Sodium 142 135 - 145 mmol/L   Potassium 3.5 3.5 - 5.1 mmol/L   Chloride 107 98 - 111 mmol/L   CO2 26 22 - 32 mmol/L   Glucose, Bld 104 (H) 70 - 99 mg/dL    Comment: Glucose reference range applies only to samples taken after fasting for at least 8 hours.   BUN 12 6 - 20 mg/dL   Creatinine, Ser 1.30 0.44 - 1.00 mg/dL   Calcium 9.1 8.9 - 86.5 mg/dL   Total Protein 7.1 6.5 - 8.1 g/dL   Albumin 3.2 (L) 3.5 - 5.0 g/dL   AST 17 15 - 41 U/L   ALT 15 0 - 44 U/L   Alkaline Phosphatase 41 38 - 126 U/L  Total Bilirubin <0.1 (L) 0.3 - 1.2 mg/dL   GFR, Estimated >16 >10 mL/min    Comment: (NOTE) Calculated using the CKD-EPI Creatinine Equation (2021)    Anion gap 9 5 - 15    Comment: Performed at [facility], [address], [place], [place] [address]  CBC with Differential     Status: Abnormal   Collection Time: 04/06/23  3:23 PM  Result Value Ref Range   WBC 4.5 4.0 - 10.5 K/uL   RBC 3.94 3.87 - 5.11 MIL/uL   Hemoglobin 6.9 (LL) 12.0 - 15.0 g/dL    Comment: REPEATED TO VERIFY Reticulocyte Hemoglobin testing may be clinically indicated, consider ordering this additional test [id number] THIS CRITICAL RESULT HAS VERIFIED AND BEEN CALLED TO L BYRD,RN BY WALTER BOND ON 10 15 2024 AT 1625, AND HAS  BEEN READ BACK.     HCT 24.8 (L) 36.0 - 46.0 %   MCV 62.9 (L) 80.0 - 100.0 fL   MCH 17.5 (L) 26.0 - 34.0 pg   MCHC 27.8 (L) 30.0 - 36.0 g/dL   RDW 47.8 (H) 29.5 - 62.1 %   Platelets 344 150 - 400 K/uL   nRBC 0.4 (H) 0.0 - 0.2 %   Neutrophils Relative % 51 %   Neutro Abs 2.3 1.7 - 7.7 K/uL   Lymphocytes Relative 33 %   Lymphs Abs 1.5 0.7 - 4.0 K/uL   Monocytes Relative 11 %   Monocytes Absolute 0.5 0.1 - 1.0 K/uL   Eosinophils Relative 3 %   Eosinophils Absolute 0.1 0.0 - 0.5 K/uL   Basophils Relative 1 %   Basophils Absolute 0.0 0.0 - 0.1 K/uL   WBC Morphology MORPHOLOGY UNREMARKABLE    RBC Morphology See Note    Smear Review PLATELET COUNT CONFIRMED BY SMEAR    Immature Granulocytes 1 %   Abs Immature Granulocytes 0.05 0.00 - 0.07 K/uL   Ovalocytes PRESENT     Comment: Performed at [facility], [address], [place], [place] [address]  hCG, serum, qualitative     Status: None   Collection Time: 04/06/23  3:23 PM  Result Value Ref Range   Preg, Serum NEGATIVE NEGATIVE    Comment:        THE SENSITIVITY OF THIS METHODOLOGY IS >10 mIU/mL. Performed at [facility], [address], [place], [place] [address]   I-Stat Lactic Acid, ED     Status: None   Collection Time: 04/06/23  4:05 PM  Result Value Ref Range   Lactic Acid, Venous 0.7 0.5 - 1.9 mmol/L  POC occult blood, ED     Status: None   Collection Time: 04/06/23  5:36 PM  Result Value Ref Range   Fecal Occult Bld NEGATIVE NEGATIVE  CBC with Differential     Status: Abnormal   Collection Time: 04/06/23  5:47 PM  Result Value Ref Range   WBC 5.7 4.0 - 10.5 K/uL   RBC 4.07 3.87 - 5.11 MIL/uL   Hemoglobin 7.2 (L) 12.0 - 15.0 g/dL    Comment: Reticulocyte Hemoglobin testing may be clinically indicated, consider ordering this additional test [id number]    HCT 26.4 (L) 36.0 - 46.0 %   MCV 64.9 (L) 80.0 - 100.0 fL   MCH 17.7 (L) 26.0 - 34.0 pg   MCHC 27.3 (L) 30.0 - 36.0 g/dL   RDW 32.4 (H) 40.1 - 02.7  %   Platelets 346 150 - 400 K/uL   nRBC 0.3 (  H) 0.0 - 0.2 %   Neutrophils Relative % 49 %   Neutro Abs 2.8 1.7 - 7.7 K/uL   Lymphocytes Relative 34 %   Lymphs Abs 2.0 0.7 - 4.0 K/uL   Monocytes Relative 12 %   Monocytes Absolute 0.7 0.1 - 1.0 K/uL   Eosinophils Relative 3 %   Eosinophils Absolute 0.2 0.0 - 0.5 K/uL   Basophils Relative 1 %   Basophils Absolute 0.0 0.0 - 0.1 K/uL   WBC Morphology MORPHOLOGY UNREMARKABLE    RBC Morphology See Note    Smear Review PLATELET COUNT CONFIRMED BY SMEAR    Immature Granulocytes 1 %   Abs Immature Granulocytes 0.05 0.00 - 0.07 K/uL   Ovalocytes PRESENT     Comment: Performed at [facility], [address] [place], [place] [address]  ABO/Rh     Status: None   Collection Time: 04/06/23  5:47 PM  Result Value Ref Range   ABO/RH(D)      A POS Performed at [facility], [address], [place], [place] [address]   I-Stat Lactic Acid, ED     Status: None   Collection Time: 04/06/23  5:54 PM  Result Value Ref Range   Lactic Acid, Venous 1.0 0.5 - 1.9 mmol/L  I-stat chem 8, ED (not at [place] [facility], [facility] or [facility])     Status: Abnormal   Collection Time: 04/06/23  5:57 PM  Result Value Ref Range   Sodium 144 135 - 145 mmol/L   Potassium 3.6 3.5 - 5.1 mmol/L   Chloride 106 98 - 111 mmol/L   BUN 12 6 - 20 mg/dL   Creatinine, Ser 0.98 0.44 - 1.00 mg/dL   Glucose, Bld 88 70 - 99 mg/dL    Comment: Glucose reference range applies only to samples taken after fasting for at least 8 hours.   Calcium, Ion 1.19 1.15 - 1.40 mmol/L   TCO2 27 22 - 32 mmol/L   Hemoglobin 8.5 (L) 12.0 - 15.0 g/dL   HCT 11.9 (L) 14.7 - 82.9 %  Type and screen [facility]     Status: None (Preliminary result)   Collection Time: 04/06/23  8:25 PM  Result Value Ref Range   ABO/RH(D) PENDING    Antibody Screen PENDING    Sample Expiration      04/09/2023,2359 Performed at [facility], [address] [place], [place] [address]    No results  found.  Pending Labs Unresulted Labs (From admission, onward)     Start     Ordered   04/07/23 0500  Vitamin B12  (Anemia Panel (PNL))  Tomorrow morning,   R        04/06/23 1947   04/07/23 0500  Folate  (Anemia Panel (PNL))  Tomorrow morning,   R        04/06/23 1947   04/07/23 0500  Prealbumin  Tomorrow morning,   R        04/06/23 1947   04/06/23 2009  Urinalysis, Complete w Microscopic -Urine, Clean Catch  Once,   URGENT       Question Answer Comment  Release to patient Immediate   Specimen Source Urine, Clean Catch      04/06/23 2008   04/06/23 1950  MRSA Next Gen by PCR, Nasal  Once,   URGENT        04/06/23 1949   04/06/23 1948  Iron and TIBC  (Anemia Panel (PNL))  Add-on,  AD        04/06/23 1947   04/06/23 1948  Ferritin  (Anemia Panel (PNL))  Add-on,   AD        04/06/23 1947   04/06/23 1948  Reticulocytes  (Anemia Panel (PNL))  Add-on,   AD        04/06/23 1947   04/06/23 1948  Procalcitonin  Add-on,   AD       References:    Procalcitonin Lower Respiratory Tract Infection AND Sepsis Procalcitonin Algorithm   04/06/23 1947   04/06/23 1948  CK  Add-on,   AD        04/06/23 1947   04/06/23 1948  Magnesium  Add-on,   AD        04/06/23 1947   04/06/23 1948  Phosphorus  Add-on,   AD        04/06/23 1947   04/06/23 1948  TSH  Add-on,   AD        04/06/23 1947   Unscheduled  Occult blood card to lab, stool RN will collect  As needed,   R     Question Answer Comment  Specimen to be collected by: RN will collect   Release to patient Immediate      04/06/23 2013   Signed and Held  HIV Antibody (routine testing w rflx)  (HIV Antibody (Routine testing w reflex) panel)  Once,   R        Signed and Held   Signed and Held  Magnesium  Tomorrow morning,   R        Signed and Held   Signed and Held  Phosphorus  Tomorrow morning,   R        Signed and Held   Signed and Held  Comprehensive metabolic panel  Tomorrow morning,   R       Question:  Release to patient  Answer:   Immediate   Signed and Held   Signed and Held  CBC  Tomorrow morning,   R       Question:  Release to patient  Answer:  Immediate   Signed and Held            Vitals/Pain Today's Vitals   04/06/23 1830 04/06/23 1845 04/06/23 1900 04/06/23 1915  BP: 131/60 119/72 119/68 127/75  Pulse: 77 82 74 73  Resp:      Temp:      TempSrc:      SpO2: 100% 100% 100% 100%  Weight:      Height:      PainSc:        Isolation Precautions No active isolations  Medications Medications  cefTRIAXone (ROCEPHIN) 2 g in sodium chloride 0.9 % 100 mL IVPB (0 g Intravenous Stopped 04/06/23 1853)    Mobility walks     Focused Assessments Cellulitis   R Recommendations: See Admitting Provider Note  Report given to:   Additional Notes: Initial Hgb 6.9, rpt 8.5. No blood given, possible lab error.

## 2023-04-06 NOTE — ED Provider Notes (Signed)
[place] EMERGENCY DEPARTMENT AT [facility] Provider Note   CSN: [id number] Arrival date & time: 04/06/23  1446     History  Chief Complaint  Patient presents with   Cellulitis    Amber Arroyo is a 27 y.o. female presents today for evaluation of cellulitis.  Patient has has increased swelling, pain and redness on her right leg for 3 days.  Patient has taken doxycycline for 3 days with no improvement, in fact symptoms have gotten worse.  She denies any fever, cold/chills, nausea, vomiting, abdominal pain, bowel changes, urinary symptoms, blood in urine.  Reports 1 episode of seeing blood on wipes after having constipation.  She denies any abnormal vaginal bleeding.  No change in menstrual cycle.  HPI  Past Medical History:  Diagnosis Date   Allergy    eczema, seasonal allergies   Asthma    Eczema    Essential hypertension    Obesity, Class III, BMI 40-49.9 (morbid obesity) (HCC) 11/18/2020   Past Surgical History:  Procedure Laterality Date   INCISION AND DRAINAGE ABSCESS Left 02/04/2013   Procedure: INCISION AND DRAINAGE POSTERIOR NECK ABSCESS;  Surgeon: Flo Shanks, MD;  Location: [facility] OR;  Service: ENT;  Laterality: Left;   MOUTH SURGERY     Wisdom Teeth Extraction     Home Medications Prior to Admission medications   Medication Sig Start Date End Date Taking? Authorizing Provider  albuterol (VENTOLIN HFA) 108 (90 Base) MCG/ACT inhaler Inhale 2 puffs into the lungs every 6 (six) hours as needed.    [provider]  betamethasone, augmented, (DIPROLENE) 0.05 % lotion betamethasone, augmented 0.05 % lotion  APPLY A SMALL AMOUNT TO THE SCALP ONCE A DAY    [provider]  cetirizine (ZYRTEC ALLERGY) 10 MG tablet Take 1 tablet (10 mg total) by mouth daily. 06/22/20   Hall-Potvin, [place], PA-C  doxycycline (VIBRAMYCIN) 100 MG capsule Take 1 capsule (100 mg total) by mouth 2 (two) times daily. 04/04/23   Tomi Bamberger, PA-C  EPINEPHrine 0.3  mg/0.3 mL IJ SOAJ injection See admin instructions.    [provider]  FEROSUL 325 (65 Fe) MG tablet Take 325 mg by mouth daily. 12/14/22   [provider]  fluticasone (CUTIVATE) 0.05 % cream Apply 1 Application topically 2 (two) times daily.    [provider]  fluticasone (FLONASE) 50 MCG/ACT nasal spray Place 1-2 sprays into both nostrils daily as needed for allergies or rhinitis. 01/03/21   Eleonore Chiquito, FNP  hydrochlorothiazide (HYDRODIURIL) 12.5 MG tablet Take 1 tablet (12.5 mg total) by mouth daily. 01/03/21   Eleonore Chiquito, FNP  hydrochlorothiazide (HYDRODIURIL) 25 MG tablet Take 25 mg by mouth daily. 03/31/23   [provider]  hydrOXYzine (ATARAX) 25 MG tablet Take 25 mg by mouth as needed. 03/31/23   [provider]  levocetirizine (XYZAL) 5 MG tablet 1 tablet in the evening 10/29/20   [provider]  metroNIDAZOLE (FLAGYL) 500 MG tablet Take 500 mg by mouth 2 (two) times daily.    [provider]  naproxen (NAPROSYN) 500 MG tablet Take 1 tablet (500 mg total) by mouth 2 (two) times daily. 11/25/20   Wieters, Hallie C, PA-C  norethindrone (AYGESTIN) 5 MG tablet Take 5 mg by mouth 2 (two) times daily. 12/09/22   [provider]  ondansetron (ZOFRAN-ODT) 4 MG disintegrating tablet Take 1 tablet (4 mg total) by mouth every 8 (eight) hours as needed. 04/02/23   Tomi Bamberger, PA-C  triamcinolone  cream (KENALOG) 0.1 % APPLY EXTERNALLY TO THE AFFECTED AREA TWICE DAILY Patient taking differently: Apply 1 application topically See admin instructions. Apply to affected areas 2 times a day 02/25/18   Hoy Register, MD  triamcinolone ointment (KENALOG) 0.1 % Apply 1 Application topically 2 (two) times daily as needed. 03/12/23   [provider]  VENTOLIN HFA 108 (90 Base) MCG/ACT inhaler INHALE 2 PUFFS INTO THE LUNGS EVERY 6 HOURS AS NEEDED FOR WHEEZING OR SHORTNESS OF BREATH 09/25/21   Hoy Register, MD  Vitamin D,  Ergocalciferol, (DRISDOL) 1.25 MG (50000 UNIT) CAPS capsule Take 1 capsule (50,000 Units total) by mouth every 7 (seven) days. 01/09/21   Eleonore Chiquito, FNP      Allergies    Fish-derived products, Shellfish-derived products, and Tree extract    Review of Systems   Review of Systems Negative except as per HPI.  Physical Exam Updated Vital Signs BP 127/75   Pulse 73   Temp 98.4 F (36.9 C) (Oral)   Resp 16   Ht 5\' 3"  (1.6 m)   Wt 125.6 kg   LMP 03/29/2023 (Approximate)   SpO2 100%   BMI 49.07 kg/m  Physical Exam Vitals and nursing note reviewed.  Constitutional:      Appearance: Normal appearance.  HENT:     Head: Normocephalic and atraumatic.     Mouth/Throat:     Mouth: Mucous membranes are moist.  Eyes:     General: No scleral icterus. Cardiovascular:     Rate and Rhythm: Normal rate and regular rhythm.     Pulses: Normal pulses.     Heart sounds: Normal heart sounds.  Pulmonary:     Effort: Pulmonary effort is normal.     Breath sounds: Normal breath sounds.  Abdominal:     General: Abdomen is flat.     Palpations: Abdomen is soft.     Tenderness: There is no abdominal tenderness.  Musculoskeletal:        General: No deformity.     Comments: Swelling, redness, tenderness to palpation to right lower leg.  Skin:    General: Skin is warm.     Findings: No rash.  Neurological:     General: No focal deficit present.     Mental Status: She is alert.  Psychiatric:        Mood and Affect: Mood normal.     ED Results / Procedures / Treatments   Labs (all labs ordered are listed, but only abnormal results are displayed) Labs Reviewed  COMPREHENSIVE METABOLIC PANEL - Abnormal; Notable for the following components:      Result Value   Glucose, Bld 104 (*)    Albumin 3.2 (*)    Total Bilirubin <0.1 (*)    All other components within normal limits  CBC WITH DIFFERENTIAL/PLATELET - Abnormal; Notable for the following components:   Hemoglobin 6.9 (*)    HCT  24.8 (*)    MCV 62.9 (*)    MCH 17.5 (*)    MCHC 27.8 (*)    RDW 19.0 (*)    nRBC 0.4 (*)    All other components within normal limits  CBC WITH DIFFERENTIAL/PLATELET - Abnormal; Notable for the following components:   Hemoglobin 7.2 (*)    HCT 26.4 (*)    MCV 64.9 (*)    MCH 17.7 (*)    MCHC 27.3 (*)    RDW 19.2 (*)    nRBC 0.3 (*)    All other components within normal  limits  I-STAT CHEM 8, ED - Abnormal; Notable for the following components:   Hemoglobin 8.5 (*)    HCT 25.0 (*)    All other components within normal limits  MRSA NEXT GEN BY PCR, NASAL  HCG, SERUM, QUALITATIVE  VITAMIN B12  FOLATE  IRON AND TIBC  FERRITIN  RETICULOCYTES  PROCALCITONIN  CK  MAGNESIUM  PHOSPHORUS  PREALBUMIN  TSH  URINALYSIS, COMPLETE (UACMP) WITH MICROSCOPIC  I-STAT CG4 LACTIC ACID, ED  I-STAT CG4 LACTIC ACID, ED  POC OCCULT BLOOD, ED  TYPE AND SCREEN  ABO/RH    EKG None  Radiology No results found.  Procedures Procedures    Medications Ordered in ED Medications  cefTRIAXone (ROCEPHIN) 2 g in sodium chloride 0.9 % 100 mL IVPB (0 g Intravenous Stopped 04/06/23 1853)    ED Course/ Medical Decision Making/ A&P                                 Medical Decision Making Amount and/or Complexity of Data Reviewed Labs: ordered.  Risk Decision regarding hospitalization.   This patient presents to the ED for cellulitis, this involves an extensive number of treatment options, and is a complaint that carries with a high risk of complications and morbidity.  The differential diagnosis includes cellulitis, DVT, GI bleed, uterine bleeding [place]y.  This is not an exhaustive list.  Lab tests: I ordered and personally interpreted labs.  The pertinent results include: WBC unremarkable. Hbg 6.9. Platelets unremarkable. Electrolytes unremarkable. BUN, creatinine unremarkable.  Problem list/ ED course/ Critical interventions/ Medical management: HPI: See above Vital signs within  normal range and stable throughout visit. Laboratory/imaging studies significant for: See above. On physical examination, patient is afebrile and appears in no acute distress. This patient presents with worsening local erythema, warmth, swelling concerning for cellulitis. Sensitivity/pain to light touch around the erythematous area. No lymphangitic spread visible and no fluid pockets or fluctuance concerning for abscess noted. Low concern for osteomyelitis or DVT. No immune compromise, bullae, pain out of proportion, or rapid progression concerning for necrotizing fasciitis.  Patient will require admission for IV antibiotics due to failing p.o. doxycycline.  Hemoglobin of 6.9 with unclear etiology.  Rectal exam with RN present throughout examination with negative Hemoccult.  Patient will require further evaluation during inpatient admission. I have reviewed the patient home medicines and have made adjustments as needed.  Cardiac monitoring/EKG: The patient was maintained on a cardiac monitor.  I personally reviewed and interpreted the cardiac monitor which showed an underlying rhythm of: sinus rhythm.  Additional history obtained: External records from outside source obtained and reviewed including: Chart review including previous notes, labs, imaging.  Consultations obtained: I spoke to Dr. Adela Glimpse [facility].  She agreed to admit the patient.  Disposition Admit.  This chart was dictated using voice recognition software.  Despite best efforts to proofread,  errors can occur which can change the documentation meaning.          Final Clinical Impression(s) / ED Diagnoses Final diagnoses:  Cellulitis of right lower extremity    Rx / DC Orders ED Discharge Orders     None         Jeanelle Malling, [place] 04/07/23 1113    Royanne Foots, DO 04/12/23 [phone]

## 2023-04-07 ENCOUNTER — Inpatient Hospital Stay (HOSPITAL_COMMUNITY): Payer: No Typology Code available for payment source

## 2023-04-07 DIAGNOSIS — R609 Edema, unspecified: Secondary | ICD-10-CM | POA: Diagnosis not present

## 2023-04-07 DIAGNOSIS — L03115 Cellulitis of right lower limb: Secondary | ICD-10-CM | POA: Diagnosis not present

## 2023-04-07 LAB — URINALYSIS, COMPLETE (UACMP) WITH MICROSCOPIC
Bilirubin Urine: NEGATIVE
Glucose, UA: NEGATIVE mg/dL
Hgb urine dipstick: NEGATIVE
Ketones, ur: 5 mg/dL — AB
Nitrite: NEGATIVE
Protein, ur: 30 mg/dL — AB
Specific Gravity, Urine: 1.03 (ref 1.005–1.030)
pH: 5 (ref 5.0–8.0)

## 2023-04-07 LAB — CBC
HCT: 24.2 % — ABNORMAL LOW (ref 36.0–46.0)
Hemoglobin: 6.7 g/dL — CL (ref 12.0–15.0)
MCH: 17.2 pg — ABNORMAL LOW (ref 26.0–34.0)
MCHC: 27.7 g/dL — ABNORMAL LOW (ref 30.0–36.0)
MCV: 62.2 fL — ABNORMAL LOW (ref 80.0–100.0)
Platelets: 320 10*3/uL (ref 150–400)
RBC: 3.89 MIL/uL (ref 3.87–5.11)
RDW: 19.2 % — ABNORMAL HIGH (ref 11.5–15.5)
WBC: 4.4 10*3/uL (ref 4.0–10.5)
nRBC: 0.5 % — ABNORMAL HIGH (ref 0.0–0.2)

## 2023-04-07 LAB — FOLATE: Folate: 11.1 ng/mL (ref >5.9)

## 2023-04-07 LAB — PREPARE RBC (CROSSMATCH)

## 2023-04-07 LAB — COMPREHENSIVE METABOLIC PANEL
ALT: 14 U/L (ref 0–44)
AST: 12 U/L — ABNORMAL LOW (ref 15–41)
Albumin: 2.8 g/dL — ABNORMAL LOW (ref 3.5–5.0)
Alkaline Phosphatase: 36 U/L — ABNORMAL LOW (ref 38–126)
Anion gap: 9 (ref 5–15)
BUN: 10 mg/dL (ref 6–20)
CO2: 27 mmol/L (ref 22–32)
Calcium: 8.8 mg/dL — ABNORMAL LOW (ref 8.9–10.3)
Chloride: 105 mmol/L (ref 98–111)
Creatinine, Ser: 0.59 mg/dL (ref 0.44–1.00)
GFR, Estimated: >60 mL/min (ref >60)
Glucose, Bld: 98 mg/dL (ref 70–99)
Potassium: 3.2 mmol/L — ABNORMAL LOW (ref 3.5–5.1)
Sodium: 141 mmol/L (ref 135–145)
Total Bilirubin: 0.1 mg/dL — ABNORMAL LOW (ref 0.3–1.2)
Total Protein: 6.4 g/dL — ABNORMAL LOW (ref 6.5–8.1)

## 2023-04-07 LAB — HIV ANTIBODY (ROUTINE TESTING W REFLEX): HIV Screen 4th Generation wRfx: NONREACTIVE

## 2023-04-07 LAB — MRSA NEXT GEN BY PCR, NASAL: MRSA by PCR Next Gen: NOT DETECTED

## 2023-04-07 LAB — HEMOGLOBIN AND HEMATOCRIT, BLOOD
HCT: 28.1 % — ABNORMAL LOW (ref 36.0–46.0)
Hemoglobin: 8.1 g/dL — ABNORMAL LOW (ref 12.0–15.0)

## 2023-04-07 LAB — VITAMIN B12: Vitamin B-12: 569 pg/mL (ref 180–914)

## 2023-04-07 LAB — PHOSPHORUS: Phosphorus: 4.5 mg/dL (ref 2.5–4.6)

## 2023-04-07 LAB — MAGNESIUM: Magnesium: 2.1 mg/dL (ref 1.7–2.4)

## 2023-04-07 LAB — PREALBUMIN: Prealbumin: 12 mg/dL — ABNORMAL LOW (ref 18–38)

## 2023-04-07 MED ORDER — ENOXAPARIN SODIUM 40 MG/0.4ML IJ SOSY
40.0000 mg | PREFILLED_SYRINGE | INTRAMUSCULAR | Status: DC
  Administered 2023-04-07 – 2023-04-08 (×2): 40 mg via SUBCUTANEOUS
  Filled 2023-04-07 (×2): qty 0.4

## 2023-04-07 MED ORDER — POTASSIUM CHLORIDE 20 MEQ PO PACK
40.0000 meq | PACK | Freq: Once | ORAL | Status: AC
  Administered 2023-04-07: 40 meq via ORAL
  Filled 2023-04-07: qty 2

## 2023-04-07 MED ORDER — SODIUM CHLORIDE 0.9 % IV SOLN
250.0000 mg | Freq: Once | INTRAVENOUS | Status: AC
  Administered 2023-04-07: 250 mg via INTRAVENOUS
  Filled 2023-04-07: qty 20

## 2023-04-07 MED ORDER — SODIUM CHLORIDE 0.9% IV SOLUTION: Freq: Once | INTRAVENOUS | Status: AC

## 2023-04-07 NOTE — Progress Notes (Addendum)
Acute on chronic iron deficiency anemia:  -Patient nurse reporting that morning blood work showing hemoglobin drop 8.5 to 6.7. -CBC showing and hemoglobin 6.7, hematocrit 24, low MCV 62. - Per chart review of the admitting physician note patient has been admitted for cellulitis and patient has history of chronic anemia.  Patient does not have any active bleeding.  Patient is hemodynamically stable. -Per patient nurse; patient has already consented for blood transfusion. - Preparing to 2 units of blood and transfusing 1 unit of blood now. -Also transfusing Ferrlecit 250 mg once. -Checking anemia panel - Checking FOBT  Tereasa Coop, MD [facility] 04/07/2023, 6:13 AM

## 2023-04-07 NOTE — Progress Notes (Signed)
Contacted OC MD, about cardiac monitoring expiring. No new orders given, ok to d/c monitoring.

## 2023-04-07 NOTE — Plan of Care (Signed)

## 2023-04-07 NOTE — Progress Notes (Addendum)
PROGRESS NOTE    Amber Arroyo  ZOX:[id number] DOB: 25-Jun-1995 DOA: 04/06/2023 PCP: Salvatore Decent, PA-C  Outpatient Specialists: gynecology    Brief Narrative:   Amber Arroyo is a 27 y.o. female with medical history significant of obesity, cellulitis, anemia     Presented with  right leg pain  3 days Patient has been on antibiotics for cellulitis since Sunday but yesterday noticed increased redness and swelling Right lower extremity is affected associated headache no fever pain with walking history of eczema and prior cellulitis as well as obesity  Last period was on the October 7, period lasts 5 dys and   Assessment & Plan:   Principal Problem:   Cellulitis Active Problems:   Obesity   Obesity, Class III, BMI 40-49.9 (morbid obesity) (HCC)   Essential hypertension   Anemia  # Right lower extremity cellulitis Present for several days. Failed outpatient doxy. No s/s sepsis. Mrsa swab negative. Patient reports no improvement today. No signs abscess. Photo taken today (see below) - continue ceftriaxone - considered adding vanc today given absence of improvement. Pharmacist Ulyses Southward counseled against. We can see how things go in the next 24 hours on ceftriaxone, but if no improvement by tomorrow then I definitely would consider starting vancomycin - f/u PVL  # Iron deficiency anemia Appears to be 2/2 abnormal uterine bleeding. Patient says was seen by gyn and told nothing to do but now with severe anemia obviously that's not the case. Not currently bleeding, denies melena or other bleeding. Hgb 6.7 today, asymptomatic - 1 unit ordered - IV iron also ordered - would plan to discharge on an oral progestin (such as norethindrone 5 mg daily) - outpt gyn f/u, needs a long-term plan such as lng-iud. Also needs endometrial biopsy if this wasn't obtained at time of initial w/u  # Obesity Noted  # HTN Here bp wnl - holding home hctz    DVT prophylaxis: lovenox (think this is safe as  patient is not actively bleeding) Code Status: full Family Communication: friend updated @ bedside  Level of care: Telemetry Medical Status is: Inpatient Remains inpatient appropriate because: need for IV abx    Consultants:  none  Procedures: none  Antimicrobials:  Ceftriaxone>ceftriaxone/vanc   Subjective: Mild RLE pain, no fevers, tolerating diet  Objective: Vitals:   04/07/23 0055 04/07/23 0645 04/07/23 0700 04/07/23 0944  BP: 139/84 125/80 136/86 131/72  Pulse: 67 76 83 83  Resp: 18 18 18 17   Temp: 98 F (36.7 C) (!) 97.4 F (36.3 C) 98 F (36.7 C) 97.8 F (36.6 C)  TempSrc: Oral Oral  Oral  SpO2: 100% 100%  100%  Weight:      Height:        Intake/Output Summary (Last 24 hours) at 04/07/2023 1110 Last data filed at 04/07/2023 0958 Gross per 24 hour  Intake 314.16 ml  Output --  Net 314.16 ml   Filed Weights   04/06/23 1521  Weight: 125.6 kg    Examination:  General exam: Appears calm and comfortable  Respiratory system: Clear to auscultation. Respiratory effort normal. Cardiovascular system: S1 & S2 heard, RRR. No JVD, murmurs, rubs, gallops or clicks. No pedal edema. Gastrointestinal system: Abdomen is obese, soft and nontender.   Central nervous system: Alert and oriented. No focal neurological deficits. Extremities: Symmetric 5 x 5 power. Skin: from today: Psychiatry: Judgement and insight appear normal. Mood & affect appropriate.     Data Reviewed: I have personally reviewed following labs and imaging  studies  CBC: Recent Labs  Lab 04/06/23 1523 04/06/23 1747 04/06/23 1757 04/07/23 0457  WBC 4.5 5.7  --  4.4  NEUTROABS 2.3 2.8  --   --   HGB 6.9* 7.2* 8.5* 6.7*  HCT 24.8* 26.4* 25.0* 24.2*  MCV 62.9* 64.9*  --  62.2*  PLT 344 346  --  320   Basic Metabolic Panel: Recent Labs  Lab 04/06/23 1523 04/06/23 1757 04/06/23 2144 04/07/23 0457  NA 142 144  --  141  K 3.5 3.6  --  3.2*  CL 107 106  --  105  CO2 26  --   --  27   GLUCOSE 104* 88  --  98  BUN 12 12  --  10  CREATININE 0.71 0.70  --  0.59  CALCIUM 9.1  --   --  8.8*  MG  --   --  2.1 2.1  PHOS  --   --  3.3 4.5   GFR: Estimated Creatinine Clearance: 136.2 mL/min (by C-G formula based on SCr of 0.59 mg/dL). Liver Function Tests: Recent Labs  Lab 04/06/23 1523 04/07/23 0457  AST 17 12*  ALT 15 14  ALKPHOS 41 36*  BILITOT <0.1* 0.1*  PROT 7.1 6.4*  ALBUMIN 3.2* 2.8*   No results for input(s): "LIPASE", "AMYLASE" in the last 168 hours. No results for input(s): "AMMONIA" in the last 168 hours. Coagulation Profile: No results for input(s): "INR", "PROTIME" in the last 168 hours. Cardiac Enzymes: Recent Labs  Lab 04/06/23 2144  CKTOTAL 113   BNP (last 3 results) No results for input(s): "PROBNP" in the last 8760 hours. HbA1C: No results for input(s): "HGBA1C" in the last 72 hours. CBG: No results for input(s): "GLUCAP" in the last 168 hours. Lipid Profile: No results for input(s): "CHOL", "HDL", "LDLCALC", "TRIG", "CHOLHDL", "LDLDIRECT" in the last 72 hours. Thyroid Function Tests: Recent Labs    04/06/23 2144  TSH 3.128   Anemia Panel: Recent Labs    04/06/23 2144 04/07/23 0457  VITAMINB12  --  569  FOLATE  --  11.1  FERRITIN 17  --   TIBC 431  --   IRON 12*  --   RETICCTPCT 0.5  --    Urine analysis:    Component Value Date/Time   COLORURINE YELLOW 04/07/2023 0049   APPEARANCEUR CLOUDY (A) 04/07/2023 0049   LABSPEC 1.030 04/07/2023 0049   PHURINE 5.0 04/07/2023 0049   GLUCOSEU NEGATIVE 04/07/2023 0049   HGBUR NEGATIVE 04/07/2023 0049   BILIRUBINUR NEGATIVE 04/07/2023 0049   BILIRUBINUR negative 04/02/2023 1111   KETONESUR 5 (A) 04/07/2023 0049   PROTEINUR 30 (A) 04/07/2023 0049   UROBILINOGEN 0.2 04/02/2023 1111   UROBILINOGEN 0.2 01/06/2018 0904   NITRITE NEGATIVE 04/07/2023 0049   LEUKOCYTESUR LARGE (A) 04/07/2023 0049   Sepsis Labs: @LABRCNTIP (procalcitonin:4,lacticidven:4)  ) Recent Results (from  the past 240 hour(s))  MRSA Next Gen by PCR, Nasal     Status: None   Collection Time: 04/07/23  4:37 AM   Specimen: Nasal Mucosa; Nasal Swab  Result Value Ref Range Status   MRSA by PCR Next Gen NOT DETECTED NOT DETECTED Final    Comment: (NOTE) The GeneXpert MRSA Assay (FDA approved for NASAL specimens only), is one component of a comprehensive MRSA colonization surveillance program. It is not intended to diagnose MRSA infection nor to guide or monitor treatment for MRSA infections. Test performance is not FDA approved in patients less than 2 years old. Performed at [facility], [address] [place], [place] [address]          Radiology Studies: DG Tibia/Fibula Right  Result Date: 04/06/2023 CLINICAL DATA:  Cellulitis of the right lower extremity. EXAM: RIGHT TIBIA AND FIBULA - 2 VIEW COMPARISON:  None Available. FINDINGS: There is no acute fracture or dislocation. The bones are well mineralized. No significant arthritic changes. Diffuse subcutaneous edema. No opaque foreign object or soft tissue gas. IMPRESSION: 1. No acute fracture or dislocation. 2. Diffuse subcutaneous edema. Electronically Signed   By: Elgie Collard M.D.   On: 04/06/2023 22:18        Scheduled Meds: Continuous Infusions:  cefTRIAXone (ROCEPHIN)  IV     ferric gluconate (FERRLECIT) IVPB 135 mL/hr at 04/07/23 0958     LOS: 1 day     Silvano Bilis, MD [facility]   If 7PM-7AM, please contact night-coverage [email or web address] Password [email or web address] 04/07/2023, 11:10 AM

## 2023-04-07 NOTE — Progress Notes (Signed)
Lower extremity venous duplex completed. Please see CV Procedures for preliminary results.  Shona Simpson, RVT 04/07/23 1:33 PM

## 2023-04-07 NOTE — Progress Notes (Signed)
D/w Dr Ashok Pall, continue ceftriaxone for now and re-assess in AM to see if vanc is needed.  Ulyses Southward, PharmD, BCIDP, AAHIVP, CPP Infectious Disease Pharmacist 04/07/2023 12:05 PM

## 2023-04-08 DIAGNOSIS — L03115 Cellulitis of right lower limb: Secondary | ICD-10-CM | POA: Diagnosis not present

## 2023-04-08 LAB — COMPREHENSIVE METABOLIC PANEL
ALT: 14 U/L (ref 0–44)
AST: 14 U/L — ABNORMAL LOW (ref 15–41)
Albumin: 3 g/dL — ABNORMAL LOW (ref 3.5–5.0)
Alkaline Phosphatase: 42 U/L (ref 38–126)
Anion gap: 11 (ref 5–15)
BUN: 9 mg/dL (ref 6–20)
CO2: 28 mmol/L (ref 22–32)
Calcium: 9.5 mg/dL (ref 8.9–10.3)
Chloride: 103 mmol/L (ref 98–111)
Creatinine, Ser: 0.58 mg/dL (ref 0.44–1.00)
GFR, Estimated: >60 mL/min (ref >60)
Glucose, Bld: 96 mg/dL (ref 70–99)
Potassium: 3.6 mmol/L (ref 3.5–5.1)
Sodium: 142 mmol/L (ref 135–145)
Total Bilirubin: 0.3 mg/dL (ref 0.3–1.2)
Total Protein: 7 g/dL (ref 6.5–8.1)

## 2023-04-08 LAB — CBC
HCT: 32.3 % — ABNORMAL LOW (ref 36.0–46.0)
Hemoglobin: 9.2 g/dL — ABNORMAL LOW (ref 12.0–15.0)
MCH: 18.1 pg — ABNORMAL LOW (ref 26.0–34.0)
MCHC: 28.5 g/dL — ABNORMAL LOW (ref 30.0–36.0)
MCV: 63.6 fL — ABNORMAL LOW (ref 80.0–100.0)
Platelets: 382 10*3/uL (ref 150–400)
RBC: 5.08 MIL/uL (ref 3.87–5.11)
RDW: 20.7 % — ABNORMAL HIGH (ref 11.5–15.5)
WBC: 7.1 10*3/uL (ref 4.0–10.5)
nRBC: 1 % — ABNORMAL HIGH (ref 0.0–0.2)

## 2023-04-08 MED ORDER — EPINEPHRINE 0.3 MG/0.3ML IJ SOAJ: 0.3000 mg | INTRAMUSCULAR | 2 refills | Status: AC

## 2023-04-08 MED ORDER — SODIUM CHLORIDE 0.9 % IV SOLN
2.0000 g | Freq: Once | INTRAVENOUS | Status: AC
  Administered 2023-04-08: 2 g via INTRAVENOUS
  Filled 2023-04-08: qty 20

## 2023-04-08 MED ORDER — NAPROXEN 500 MG PO TABS: 500.0000 mg | ORAL_TABLET | Freq: Two times a day (BID) | ORAL | 0 refills | Status: AC

## 2023-04-08 MED ORDER — CEPHALEXIN 500 MG PO CAPS: 500.0000 mg | ORAL_CAPSULE | Freq: Three times a day (TID) | ORAL | 0 refills | Status: AC

## 2023-04-08 NOTE — Discharge Summary (Signed)
Physician Discharge Summary  Amber Arroyo JYN:[id number] DOB: [birth year] DOA: 04/06/2023  PCP: Salvatore Decent, PA-C  Admit date: 04/06/2023 Discharge date: 04/08/2023  Admitted From: Home Disposition: Home  Recommendations for Outpatient Follow-up:  Follow up with PCP in 1-2 weeks Apply compression socks  Home Health: N/A Equipment/Devices: N/A  Discharge Condition: Stable CODE STATUS: Full code Diet recommendation: Regular diet  Discharge summary: 27 year old with history of morbid obesity, BMI 49, history of chronic iron deficiency anemia due to menstrual blood loss presented to the ER with 3 days of right leg pain and swelling not improved despite taking doxycycline that was prescribed from urgent care.  In the emergency room hemodynamically stable, afebrile.  Significant cellulitis on her right leg.  Right lower extremity cellulitis, duplex negative for DVT.  Blood cultures negative.  Treated with ceftriaxone with clinical improvement. Patient already has doxycycline that she will continue and complete the 7 days of therapy. Patient had responded well to ceftriaxone, will prescribe Keflex 500 mg 3 times daily to continue for 7 days. Duplex is negative for DVT.  Advised compression socks.  Iron deficiency anemia secondary to abnormal uterine bleeding.  Hemoglobin was 6.7.  She does have chronic anemia.  She was given 1 unit of PRBC and also 1 unit of IV iron.  Patient was taking norethindrone in the past and was advised to continue.  She will follow-up with her OB/GYN.  Medically stable for discharge.   Discharge Diagnoses:  Principal Problem:   Cellulitis Active Problems:   Obesity   Obesity, Class III, BMI 40-49.9 (morbid obesity) (HCC)   Essential hypertension   Anemia    Discharge Instructions  Discharge Instructions     Diet - low sodium heart healthy   Complete by: As directed    Increase activity slowly   Complete by: As directed       Allergies as of  04/08/2023       Reactions   Fish-derived Products Shortness Of Breath, Swelling, Other (See Comments)   Lips swell   Shellfish-derived Products Shortness Of Breath, Swelling, Other (See Comments)   Lips swell   Tree Extract Other (See Comments)   Patient was told she is allergic to trees        Medication List     TAKE these medications    acetaminophen 500 MG tablet Commonly known as: TYLENOL Take 1,000 mg by mouth every 6 (six) hours as needed for mild pain (pain score 1-3) or moderate pain (pain score 4-6).   betamethasone (augmented) 0.05 % lotion Commonly known as: DIPROLENE Apply 1 Application topically daily.   cephALEXin 500 MG capsule Commonly known as: KEFLEX Take 1 capsule (500 mg total) by mouth 3 (three) times daily for 7 days.   doxycycline 100 MG capsule Commonly known as: VIBRAMYCIN Take 1 capsule (100 mg total) by mouth 2 (two) times daily.   EPINEPHrine 0.3 mg/0.3 mL Soaj injection Commonly known as: EPI-PEN Inject 0.3 mg into the muscle See admin instructions. What changed:  how much to take how to take this   FeroSul 325 (65 Fe) MG tablet Generic drug: ferrous sulfate Take 325 mg by mouth daily.   fluticasone 0.05 % cream Commonly known as: CUTIVATE Apply 1 Application topically 2 (two) times daily.   hydrochlorothiazide 25 MG tablet Commonly known as: HYDRODIURIL Take 25 mg by mouth daily.   hydrOXYzine 25 MG tablet Commonly known as: ATARAX Take 25 mg by mouth as needed.   norethindrone 5 MG tablet Commonly  known as: AYGESTIN Take 5 mg by mouth 2 (two) times daily.   ondansetron 4 MG disintegrating tablet Commonly known as: ZOFRAN-ODT Take 1 tablet (4 mg total) by mouth every 8 (eight) hours as needed.   triamcinolone cream 0.1 % Commonly known as: KENALOG APPLY EXTERNALLY TO THE AFFECTED AREA TWICE DAILY What changed: See the new instructions.   Ventolin HFA 108 (90 Base) MCG/ACT inhaler Generic drug: albuterol INHALE 2  PUFFS INTO THE LUNGS EVERY 6 HOURS AS NEEDED FOR WHEEZING OR SHORTNESS OF BREATH        Allergies  Allergen Reactions   Fish-Derived Products Shortness Of Breath, Swelling and Other (See Comments)    Lips swell   Shellfish-Derived Products Shortness Of Breath, Swelling and Other (See Comments)    Lips swell   Tree Extract Other (See Comments)    Patient was told she is allergic to trees    Consultations: None   Procedures/Studies: VAS [place] LOWER EXTREMITY VENOUS (DVT)  Result Date: 04/07/2023  Lower Venous DVT Study Patient Name:  Amber Arroyo  Date of Exam:   04/07/2023 Medical Rec #: [id number]  Accession #:    [id number] Date of Birth: 03-28-1996  Patient Gender: F Patient Age:   69 years Exam Location:  [facility] Procedure:      VAS [place] LOWER EXTREMITY VENOUS (DVT) Referring Phys: Jonny Ruiz DOUTOVA --------------------------------------------------------------------------------  Indications: Edema.  Risk Factors: Obesity. Limitations: Body habitus and Patient movement. Comparison Study: No significant changes seen since prior exam 11/19/20 Performing Technologist: Shona Simpson  Examination Guidelines: A complete evaluation includes B-mode imaging, spectral Doppler, color Doppler, and power Doppler as needed of all accessible portions of each vessel. Bilateral testing is considered an integral part of a complete examination. Limited examinations for reoccurring indications may be performed as noted. The reflux portion of the exam is performed with the patient in reverse Trendelenburg.  +---------+---------------+---------+-----------+----------+-------------------+ RIGHT    CompressibilityPhasicitySpontaneityPropertiesThrombus Aging      +---------+---------------+---------+-----------+----------+-------------------+ CFV      Full           Yes      Yes                                      +---------+---------------+---------+-----------+----------+-------------------+  SFJ      Full                                                             +---------+---------------+---------+-----------+----------+-------------------+ FV Prox  Full                                                             +---------+---------------+---------+-----------+----------+-------------------+ FV Mid   Full                                                             +---------+---------------+---------+-----------+----------+-------------------+  FV DistalFull                                                             +---------+---------------+---------+-----------+----------+-------------------+ PFV      Full                                                             +---------+---------------+---------+-----------+----------+-------------------+ POP      Full           Yes      Yes                                      +---------+---------------+---------+-----------+----------+-------------------+ PTV                                                   Patent by color     +---------+---------------+---------+-----------+----------+-------------------+ PERO                                                  Not well visualized +---------+---------------+---------+-----------+----------+-------------------+   +----+---------------+---------+-----------+----------+--------------+ LEFTCompressibilityPhasicitySpontaneityPropertiesThrombus Aging +----+---------------+---------+-----------+----------+--------------+ CFV Full           Yes      Yes                                 +----+---------------+---------+-----------+----------+--------------+    Summary: RIGHT: - There is no evidence of deep vein thrombosis in the lower extremity.  - No cystic structure found in the popliteal fossa.  LEFT: - No evidence of common femoral vein obstruction.   *See table(s) above for measurements and observations. Electronically signed by Coral Else  MD on 04/07/2023 at 7:19:51 PM.    Final    DG Tibia/Fibula Right  Result Date: 04/06/2023 CLINICAL DATA:  Cellulitis of the right lower extremity. EXAM: RIGHT TIBIA AND FIBULA - 2 VIEW COMPARISON:  None Available. FINDINGS: There is no acute fracture or dislocation. The bones are well mineralized. No significant arthritic changes. Diffuse subcutaneous edema. No opaque foreign object or soft tissue gas. IMPRESSION: 1. No acute fracture or dislocation. 2. Diffuse subcutaneous edema. Electronically Signed   By: Elgie Collard M.D.   On: 04/06/2023 22:18   (Echo, Carotid, EGD, Colonoscopy, ERCP)    Subjective: Patient was seen and examined.  Denies any complaints.  Pain is not significant.  Redness has significantly improved but he still has some swelling.  Able to walk around.  Afebrile.   Discharge Exam: Vitals:   04/08/23 0127 04/08/23 0741  BP: (!) 123/59 110/61  Pulse: 78 63  Resp: 18 18  Temp: 98 F (36.7 C) 98.1 F (36.7 C)  SpO2: 96% 100%   Vitals:   04/07/23 1419 04/07/23  2130 04/08/23 0127 04/08/23 0741  BP: 124/75 (!) 142/76 (!) 123/59 110/61  Pulse: 77 79 78 63  Resp:  18 18 18   Temp: 97.9 F (36.6 C) 97.7 F (36.5 C) 98 F (36.7 C) 98.1 F (36.7 C)  TempSrc: Oral  Oral   SpO2: 99% 99% 96% 100%  Weight:      Height:        General: Pt is alert, awake, not in acute distress Cardiovascular: RRR, S1/S2 +, no rubs, no gallops Respiratory: CTA bilaterally, no wheezing, no rhonchi Abdominal: Soft, NT, ND, bowel sounds + Extremities:  Asymmetrical edema right more than left.  She has mild erythema behind the right ankle, no evidence of induration, no evidence of fluctuation.    The results of significant diagnostics from this hospitalization (including imaging, microbiology, ancillary and laboratory) are listed below for reference.     Microbiology: Recent Results (from the past 240 hour(s))  MRSA Next Gen by PCR, Nasal     Status: None   Collection Time:  04/07/23  4:37 AM   Specimen: Nasal Mucosa; Nasal Swab  Result Value Ref Range Status   MRSA by PCR Next Gen NOT DETECTED NOT DETECTED Final    Comment: (NOTE) The GeneXpert MRSA Assay (FDA approved for NASAL specimens only), is one component of a comprehensive MRSA colonization surveillance program. It is not intended to diagnose MRSA infection nor to guide or monitor treatment for MRSA infections. Test performance is not FDA approved in patients less than 66 years old. Performed at [facility], [address] [place], [place] [address]      Labs: BNP (last 3 results) No results for input(s): "BNP" in the last 8760 hours. Basic Metabolic Panel: Recent Labs  Lab 04/06/23 1523 04/06/23 1757 04/06/23 2144 04/07/23 0457 04/08/23 0611  NA 142 144  --  141 142  K 3.5 3.6  --  3.2* 3.6  CL 107 106  --  105 103  CO2 26  --   --  27 28  GLUCOSE 104* 88  --  98 96  BUN 12 12  --  10 9  CREATININE 0.71 0.70  --  0.59 0.58  CALCIUM 9.1  --   --  8.8* 9.5  MG  --   --  2.1 2.1  --   PHOS  --   --  3.3 4.5  --    Liver Function Tests: Recent Labs  Lab 04/06/23 1523 04/07/23 0457 04/08/23 0611  AST 17 12* 14*  ALT 15 14 14   ALKPHOS 41 36* 42  BILITOT <0.1* 0.1* 0.3  PROT 7.1 6.4* 7.0  ALBUMIN 3.2* 2.8* 3.0*   No results for input(s): "LIPASE", "AMYLASE" in the last 168 hours. No results for input(s): "AMMONIA" in the last 168 hours. CBC: Recent Labs  Lab 04/06/23 1523 04/06/23 1747 04/06/23 1757 04/07/23 0457 04/07/23 1147 04/08/23 0611  WBC 4.5 5.7  --  4.4  --  7.1  NEUTROABS 2.3 2.8  --   --   --   --   HGB 6.9* 7.2* 8.5* 6.7* 8.1* 9.2*  HCT 24.8* 26.4* 25.0* 24.2* 28.1* 32.3*  MCV 62.9* 64.9*  --  62.2*  --  63.6*  PLT 344 346  --  320  --  382   Cardiac Enzymes: Recent Labs  Lab 04/06/23 2144  CKTOTAL 113   BNP: Invalid input(s): "POCBNP" CBG: No results for input(s): "GLUCAP" in the last 168 hours. D-Dimer No results  for input(s):  "DDIMER" in the last 72 hours. Hgb A1c No results for input(s): "HGBA1C" in the last 72 hours. Lipid Profile No results for input(s): "CHOL", "HDL", "LDLCALC", "TRIG", "CHOLHDL", "LDLDIRECT" in the last 72 hours. Thyroid function studies Recent Labs    04/06/23 2144  TSH 3.128   Anemia work up Recent Labs    04/06/23 2144 04/07/23 0457  VITAMINB12  --  569  FOLATE  --  11.1  FERRITIN 17  --   TIBC 431  --   IRON 12*  --   RETICCTPCT 0.5  --    Urinalysis    Component Value Date/Time   COLORURINE YELLOW 04/07/2023 0049   APPEARANCEUR CLOUDY (A) 04/07/2023 0049   LABSPEC 1.030 04/07/2023 0049   PHURINE 5.0 04/07/2023 0049   GLUCOSEU NEGATIVE 04/07/2023 0049   HGBUR NEGATIVE 04/07/2023 0049   BILIRUBINUR NEGATIVE 04/07/2023 0049   BILIRUBINUR negative 04/02/2023 1111   KETONESUR 5 (A) 04/07/2023 0049   PROTEINUR 30 (A) 04/07/2023 0049   UROBILINOGEN 0.2 04/02/2023 1111   UROBILINOGEN 0.2 01/06/2018 0904   NITRITE NEGATIVE 04/07/2023 0049   LEUKOCYTESUR LARGE (A) 04/07/2023 0049   Sepsis Labs Recent Labs  Lab 04/06/23 1523 04/06/23 1747 04/07/23 0457 04/08/23 0611  WBC 4.5 5.7 4.4 7.1   Microbiology Recent Results (from the past 240 hour(s))  MRSA Next Gen by PCR, Nasal     Status: None   Collection Time: 04/07/23  4:37 AM   Specimen: Nasal Mucosa; Nasal Swab  Result Value Ref Range Status   MRSA by PCR Next Gen NOT DETECTED NOT DETECTED Final    Comment: (NOTE) The GeneXpert MRSA Assay (FDA approved for NASAL specimens only), is one component of a comprehensive MRSA colonization surveillance program. It is not intended to diagnose MRSA infection nor to guide or monitor treatment for MRSA infections. Test performance is not FDA approved in patients less than 49 years old. Performed at [facility], [address] [place], [place] [address]      Time coordinating discharge:  32 minutes  SIGNED:   Dorcas Carrow, MD  [facility] 04/08/2023, 10:41 AM

## 2023-04-08 NOTE — Plan of Care (Signed)

## 2023-04-10 LAB — TYPE AND SCREEN
ABO/RH(D): A POS
Antibody Screen: NEGATIVE
Unit division: 0
Unit division: 0

## 2023-04-10 LAB — BPAM RBC
Blood Product Expiration Date: 202411142359
Blood Product Expiration Date: 202411142359
ISSUE DATE / TIME: 202410160638
Unit Type and Rh: 6200
Unit Type and Rh: 6200

## 2023-06-28 ENCOUNTER — Ambulatory Visit
Admission: EM | Admit: 2023-06-28 | Discharge: 2023-06-28 | Disposition: A | Discharge status: Home / Self Care | Payer: Self-pay | Attending: Family Medicine | Admitting: Family Medicine

## 2023-06-28 DIAGNOSIS — H66001 Acute suppurative otitis media without spontaneous rupture of ear drum, right ear: Secondary | ICD-10-CM

## 2023-06-28 DIAGNOSIS — J45901 Unspecified asthma with (acute) exacerbation: Secondary | ICD-10-CM

## 2023-06-28 DIAGNOSIS — J069 Acute upper respiratory infection, unspecified: Secondary | ICD-10-CM

## 2023-06-28 LAB — POCT INFLUENZA A/B
Influenza A, POC: NEGATIVE
Influenza B, POC: NEGATIVE

## 2023-06-28 MED ORDER — AMOXICILLIN 875 MG PO TABS: 875.0000 mg | ORAL_TABLET | Freq: Two times a day (BID) | ORAL | 0 refills | Status: DC

## 2023-06-28 MED ORDER — ALBUTEROL SULFATE HFA 108 (90 BASE) MCG/ACT IN AERS: 2.0000 | INHALATION_SPRAY | RESPIRATORY_TRACT | Status: DC | PRN

## 2023-06-28 MED ORDER — ALBUTEROL SULFATE HFA 108 (90 BASE) MCG/ACT IN AERS
2.0000 | INHALATION_SPRAY | Freq: Once | RESPIRATORY_TRACT | Status: AC
  Administered 2023-06-28: 2 via RESPIRATORY_TRACT

## 2023-06-28 MED ORDER — AEROCHAMBER PLUS FLO-VU SMALL MISC
1.0000 | Freq: Once | Status: AC
  Administered 2023-06-28: 1

## 2023-06-28 MED ORDER — PROMETHAZINE-DM 6.25-15 MG/5ML PO SYRP
5.0000 mL | ORAL_SOLUTION | Freq: Four times a day (QID) | ORAL | 0 refills | Status: DC | PRN
Start: 2023-06-28 — End: 2023-06-29

## 2023-06-28 MED ORDER — PREDNISONE 20 MG PO TABS: 40.0000 mg | ORAL_TABLET | Freq: Every day | ORAL | 0 refills | Status: DC

## 2023-06-28 NOTE — Discharge Instructions (Signed)
 Flu test is negative.  Suspect her symptoms are viral and have caused her to experience a asthma exacerbation.  Prescribing prednisone  40 mg once daily for 5 days to reduce inflammation in your chest.  And resume use of albuterol  inhaler 2 puffs every 4-6 hours as needed for shortness of breath or chest tightness or coughing persistently.  Should gradually improve over the next 5 to 7 days.  Return if symptoms have not resolved within 1 week or sooner if symptoms worsen at any time.

## 2023-06-28 NOTE — ED Provider Notes (Signed)
 [name]    CSN: [id number] Arrival date & time: 06/28/23  1810      History   Chief Complaint Chief Complaint  Patient presents with   Cough    Fever, congestion, chills, and SOB    HPI Amber Arroyo is a 28 y.o. female.   HPI Patient today with history of asthma presents today with coughing, shortness of breath, headache, fatigue and bodyaches.  Patient also has subjective fever but endorses chills.  Denies any known sick contacts.  She denies any wheezing.  She has not taken any medication for her symptoms. Report  that she is out of her albuterol  inhaler. Past Medical History:  Diagnosis Date   Allergy    eczema, seasonal allergies   Asthma    Eczema    Essential hypertension    Obesity, Class III, BMI 40-49.9 (morbid obesity) (HCC) 11/18/2020    Patient Active Problem List   Diagnosis Date Noted   Anemia 04/06/2023   Essential hypertension 04/04/2023   Allergy 04/04/2023   Change in vision 01/03/2021   Cellulitis of right leg 11/18/2020   Sepsis (HCC) 11/18/2020   Obesity, Class III, BMI 40-49.9 (morbid obesity) (HCC) 11/18/2020   Asthma    Cellulitis 07/03/2017   Seasonal allergies 02/10/2016   Eczema 01/07/2016   Obesity 01/07/2016   Benign essential hypertension 07/17/2013   Abscess of neck 02/04/2013    Past Surgical History:  Procedure Laterality Date   INCISION AND DRAINAGE ABSCESS Left 02/04/2013   Procedure: INCISION AND DRAINAGE POSTERIOR NECK ABSCESS;  Surgeon: Marlyce Finer, MD;  Location: [facility] OR;  Service: ENT;  Laterality: Left;   MOUTH SURGERY     Wisdom Teeth Extraction    OB History   No obstetric history on file.      Home Medications    Prior to Admission medications   Medication Sig Start Date End Date Taking? Authorizing Provider  albuterol  (VENTOLIN  HFA) 108 (90 Base) MCG/ACT inhaler Inhale 2 puffs into the lungs every 4 (four) hours as needed for wheezing or shortness of breath. 06/28/23  Yes Arloa Suzen RAMAN, NP   amoxicillin  (AMOXIL ) 875 MG tablet Take 1 tablet (875 mg total) by mouth 2 (two) times daily for 10 days. 06/28/23 07/08/23 Yes Arloa Suzen RAMAN, NP  predniSONE  (DELTASONE ) 20 MG tablet Take 2 tablets (40 mg total) by mouth daily with breakfast. 06/28/23  Yes Arloa Suzen RAMAN, NP  promethazine -dextromethorphan  (PROMETHAZINE -DM) 6.25-15 MG/5ML syrup Take 5 mLs by mouth 4 (four) times daily as needed for cough. 06/28/23  Yes Arloa Suzen RAMAN, NP  acetaminophen  (TYLENOL ) 500 MG tablet Take 1,000 mg by mouth every 6 (six) hours as needed for mild pain (pain score 1-3) or moderate pain (pain score 4-6).    [provider]  betamethasone, augmented, (DIPROLENE) 0.05 % lotion Apply 1 Application topically daily.    [provider]  EPINEPHrine  0.3 mg/0.3 mL IJ SOAJ injection Inject 0.3 mg into the muscle See admin instructions. 04/08/23   Ghimire, Kuber, MD  FEROSUL 325 (65 Fe) MG tablet Take 325 mg by mouth daily. 12/14/22   [provider]  fluticasone  (CUTIVATE ) 0.05 % cream Apply 1 Application topically 2 (two) times daily.    [provider]  hydrochlorothiazide  (HYDRODIURIL ) 25 MG tablet Take 25 mg by mouth daily. 03/31/23   [provider]  hydrOXYzine (ATARAX) 25 MG tablet Take 25 mg by mouth as needed. 03/31/23   [provider]  norethindrone (AYGESTIN) 5 MG tablet  Take 5 mg by mouth 2 (two) times daily. 12/09/22   [provider]  ondansetron  (ZOFRAN -ODT) 4 MG disintegrating tablet Take 1 tablet (4 mg total) by mouth every 8 (eight) hours as needed. 04/02/23   Billy Asberry FALCON, PA-C  triamcinolone  cream (KENALOG ) 0.1 % APPLY EXTERNALLY TO THE AFFECTED AREA TWICE DAILY Patient taking differently: Apply 1 application  topically See admin instructions. Apply to affected areas 2 times a day 02/25/18   Delbert Clam, MD    Family History Family History  Problem Relation Age of Onset   Hypertension Mother    Asthma Mother    Eczema Mother     Hypertension Father     Social History Social History   Tobacco Use   Smoking status: Never   Smokeless tobacco: Never  Vaping Use   Vaping status: Never Used  Substance Use Topics   Alcohol use: No   Drug use: No     Allergies   Fish-derived products, Shellfish-derived products, and Tree extract   Review of Systems Review of Systems Pertinent negatives listed in HPI   Physical Exam Triage Vital Signs ED Triage Vitals  Encounter Vitals Group     BP 06/28/23 1917 139/83     Systolic BP Percentile --      Diastolic BP Percentile --      Pulse Rate 06/28/23 1917 100     Resp 06/28/23 1917 20     Temp 06/28/23 1917 99.1 F (37.3 C)     Temp Source 06/28/23 1917 Oral     SpO2 06/28/23 1917 98 %     Weight 06/28/23 1914 270 lb (122.5 kg)     Height 06/28/23 1913 5' (1.524 m)     Head Circumference --      Peak Flow --      Pain Score 06/28/23 1913 0     Pain Loc --      Pain Education --      Exclude from Growth Chart --    No data found.  Updated Vital Signs BP 139/83 (BP Location: Left Arm)   Pulse 100   Temp 99.1 F (37.3 C) (Oral)   Resp 20   Ht 5' 2.5 (1.588 m)   Wt 270 lb (122.5 kg)   LMP 06/23/2023   SpO2 98%   BMI 48.60 kg/m   Visual Acuity Right Eye Distance:   Left Eye Distance:   Bilateral Distance:    Right Eye Near:   Left Eye Near:    Bilateral Near:     Physical Exam Vitals reviewed.  Constitutional:      Appearance: She is obese. She is ill-appearing.  HENT:     Head: Normocephalic and atraumatic.     Right Ear: Hearing and external ear normal. Swelling and tenderness present. Tympanic membrane is erythematous and bulging.     Left Ear: Hearing, tympanic membrane, ear canal and external ear normal.     Nose: Congestion and rhinorrhea present.  Eyes:     Extraocular Movements: Extraocular movements intact.     Conjunctiva/sclera: Conjunctivae normal.     Pupils: Pupils are equal, round, and reactive to light.   Cardiovascular:     Rate and Rhythm: Regular rhythm. Tachycardia present.  Pulmonary:     Breath sounds: Decreased air movement present. Rhonchi present.  Musculoskeletal:        General: Normal range of motion.     Cervical back: Normal range of motion and neck supple.  Skin:  General: Skin is warm and dry.  Neurological:     General: No focal deficit present.     Mental Status: She is alert.      UC Treatments / Results  Labs (all labs ordered are listed, but only abnormal results are displayed) Labs Reviewed  POCT INFLUENZA A/B - Normal    EKG   Radiology No results found.  Procedures Procedures (including critical care time)  Medications Ordered in UC Medications  albuterol  (VENTOLIN  HFA) 108 (90 Base) MCG/ACT inhaler 2 puff (2 puffs Inhalation Given 06/28/23 1950)  AeroChamber Plus Flo-Vu Small device MISC 1 each (1 each Other Given 06/28/23 1958)    Initial Impression / Assessment and Plan / UC Course  I have reviewed the triage vital signs and the nursing notes.  Pertinent labs & imaging results that were available during my care of the patient were reviewed by me and considered in my medical decision making (see chart for details).   Today for an acute asthma exacerbation likely related to viral upper respiratory illness, disown 40 mg once daily for 5 days.  Albuterol  inhaler 2 puffs every 4-6 hours as needed. Promethazine  DM for cough. Patient also has an acute ear infection involving the right ear which has been treated empirically with amoxicillin  875 twice daily for 10 days. Final Clinical Impressions(s) / UC Diagnoses   Final diagnoses:  Asthma with acute exacerbation, unspecified asthma severity, unspecified whether persistent  Viral URI with cough  Non-recurrent acute suppurative otitis media of right ear without spontaneous rupture of tympanic membrane     Discharge Instructions      Flu test is negative.  Suspect her symptoms are viral and have  caused her to experience a asthma exacerbation.  Prescribing prednisone  40 mg once daily for 5 days to reduce inflammation in your chest.  And resume use of albuterol  inhaler 2 puffs every 4-6 hours as needed for shortness of breath or chest tightness or coughing persistently.  Should gradually improve over the next 5 to 7 days.  Return if symptoms have not resolved within 1 week or sooner if symptoms worsen at any time.     ED Prescriptions     Medication Sig Dispense Auth. Provider   albuterol  (VENTOLIN  HFA) 108 (90 Base) MCG/ACT inhaler Inhale 2 puffs into the lungs every 4 (four) hours as needed for wheezing or shortness of breath. -- Arloa Suzen RAMAN, NP   predniSONE  (DELTASONE ) 20 MG tablet Take 2 tablets (40 mg total) by mouth daily with breakfast. 10 tablet Arloa Suzen RAMAN, NP   amoxicillin  (AMOXIL ) 875 MG tablet Take 1 tablet (875 mg total) by mouth 2 (two) times daily for 10 days. 20 tablet Arloa Suzen RAMAN, NP   promethazine -dextromethorphan  (PROMETHAZINE -DM) 6.25-15 MG/5ML syrup Take 5 mLs by mouth 4 (four) times daily as needed for cough. 180 mL Arloa Suzen RAMAN, NP      PDMP not reviewed this encounter.   Arloa Suzen RAMAN, NP 06/28/23 2016

## 2023-06-28 NOTE — ED Triage Notes (Addendum)
 Patient presents with cough, fatigue, fullness in right ear, headache, vomiting x day 3. Treated with cough drops and salt water.

## 2023-06-29 ENCOUNTER — Telehealth: Payer: Self-pay | Admitting: Emergency Medicine

## 2023-06-29 MED ORDER — ALBUTEROL SULFATE HFA 108 (90 BASE) MCG/ACT IN AERS: 2.0000 | INHALATION_SPRAY | RESPIRATORY_TRACT | Status: AC | PRN

## 2023-06-29 MED ORDER — PREDNISONE 20 MG PO TABS: 40.0000 mg | ORAL_TABLET | Freq: Every day | ORAL | 0 refills | Status: DC

## 2023-06-29 MED ORDER — AMOXICILLIN 875 MG PO TABS: 875.0000 mg | ORAL_TABLET | Freq: Two times a day (BID) | ORAL | 0 refills | Status: DC

## 2023-06-29 MED ORDER — PROMETHAZINE-DM 6.25-15 MG/5ML PO SYRP: 5.0000 mL | ORAL_SOLUTION | Freq: Four times a day (QID) | ORAL | 0 refills | Status: DC | PRN

## 2023-07-05 DIAGNOSIS — I2602 Saddle embolus of pulmonary artery with acute cor pulmonale: Secondary | ICD-10-CM | POA: Diagnosis not present

## 2023-07-05 DIAGNOSIS — J9601 Acute respiratory failure with hypoxia: Secondary | ICD-10-CM | POA: Diagnosis present

## 2023-07-05 DIAGNOSIS — I82432 Acute embolism and thrombosis of left popliteal vein: Secondary | ICD-10-CM | POA: Diagnosis present

## 2023-07-05 DIAGNOSIS — Z1152 Encounter for screening for COVID-19: Secondary | ICD-10-CM

## 2023-07-05 DIAGNOSIS — I429 Cardiomyopathy, unspecified: Secondary | ICD-10-CM | POA: Diagnosis present

## 2023-07-05 DIAGNOSIS — I5022 Chronic systolic (congestive) heart failure: Secondary | ICD-10-CM | POA: Diagnosis present

## 2023-07-05 DIAGNOSIS — Z7901 Long term (current) use of anticoagulants: Secondary | ICD-10-CM

## 2023-07-05 DIAGNOSIS — Z79899 Other long term (current) drug therapy: Secondary | ICD-10-CM

## 2023-07-05 DIAGNOSIS — Z91013 Allergy to seafood: Secondary | ICD-10-CM

## 2023-07-05 DIAGNOSIS — I2699 Other pulmonary embolism without acute cor pulmonale: Secondary | ICD-10-CM | POA: Diagnosis not present

## 2023-07-05 DIAGNOSIS — J189 Pneumonia, unspecified organism: Secondary | ICD-10-CM | POA: Diagnosis present

## 2023-07-05 DIAGNOSIS — R000 Tachycardia, unspecified: Secondary | ICD-10-CM | POA: Diagnosis present

## 2023-07-05 DIAGNOSIS — J45909 Unspecified asthma, uncomplicated: Secondary | ICD-10-CM | POA: Diagnosis present

## 2023-07-05 DIAGNOSIS — I82412 Acute embolism and thrombosis of left femoral vein: Secondary | ICD-10-CM | POA: Diagnosis present

## 2023-07-05 DIAGNOSIS — J069 Acute upper respiratory infection, unspecified: Secondary | ICD-10-CM | POA: Diagnosis present

## 2023-07-05 DIAGNOSIS — I110 Hypertensive heart disease with heart failure: Secondary | ICD-10-CM | POA: Diagnosis present

## 2023-07-05 DIAGNOSIS — N920 Excessive and frequent menstruation with regular cycle: Secondary | ICD-10-CM | POA: Diagnosis present

## 2023-07-05 DIAGNOSIS — Z7952 Long term (current) use of systemic steroids: Secondary | ICD-10-CM

## 2023-07-05 DIAGNOSIS — Z888 Allergy status to other drugs, medicaments and biological substances status: Secondary | ICD-10-CM

## 2023-07-05 DIAGNOSIS — Z6841 Body Mass Index (BMI) 40.0 and over, adult: Secondary | ICD-10-CM

## 2023-07-05 DIAGNOSIS — Z8249 Family history of ischemic heart disease and other diseases of the circulatory system: Secondary | ICD-10-CM

## 2023-07-05 DIAGNOSIS — Z825 Family history of asthma and other chronic lower respiratory diseases: Secondary | ICD-10-CM

## 2023-07-05 DIAGNOSIS — D509 Iron deficiency anemia, unspecified: Secondary | ICD-10-CM | POA: Diagnosis present

## 2023-07-05 DIAGNOSIS — R9431 Abnormal electrocardiogram [ECG] [EKG]: Secondary | ICD-10-CM | POA: Diagnosis present

## 2023-07-05 DIAGNOSIS — D6869 Other thrombophilia: Secondary | ICD-10-CM | POA: Diagnosis present

## 2023-07-06 ENCOUNTER — Encounter (HOSPITAL_COMMUNITY): Payer: Self-pay | Admitting: Emergency Medicine

## 2023-07-06 ENCOUNTER — Inpatient Hospital Stay (HOSPITAL_COMMUNITY)
Admission: EM | Admit: 2023-07-06 | Discharge: 2023-07-11 | DRG: 175 | Disposition: A | Discharge status: Home / Self Care | Payer: Managed Care, Other (non HMO) | Attending: Internal Medicine | Admitting: Internal Medicine

## 2023-07-06 ENCOUNTER — Emergency Department (HOSPITAL_COMMUNITY): Payer: Managed Care, Other (non HMO)

## 2023-07-06 DIAGNOSIS — I110 Hypertensive heart disease with heart failure: Secondary | ICD-10-CM | POA: Diagnosis present

## 2023-07-06 DIAGNOSIS — I420 Dilated cardiomyopathy: Secondary | ICD-10-CM | POA: Diagnosis not present

## 2023-07-06 DIAGNOSIS — I2602 Saddle embolus of pulmonary artery with acute cor pulmonale: Secondary | ICD-10-CM | POA: Diagnosis present

## 2023-07-06 DIAGNOSIS — J45909 Unspecified asthma, uncomplicated: Secondary | ICD-10-CM | POA: Diagnosis present

## 2023-07-06 DIAGNOSIS — M79661 Pain in right lower leg: Secondary | ICD-10-CM

## 2023-07-06 DIAGNOSIS — D6869 Other thrombophilia: Secondary | ICD-10-CM | POA: Diagnosis present

## 2023-07-06 DIAGNOSIS — I2699 Other pulmonary embolism without acute cor pulmonale: Secondary | ICD-10-CM | POA: Diagnosis present

## 2023-07-06 DIAGNOSIS — Z79899 Other long term (current) drug therapy: Secondary | ICD-10-CM | POA: Diagnosis not present

## 2023-07-06 DIAGNOSIS — Z825 Family history of asthma and other chronic lower respiratory diseases: Secondary | ICD-10-CM | POA: Diagnosis not present

## 2023-07-06 DIAGNOSIS — I82432 Acute embolism and thrombosis of left popliteal vein: Secondary | ICD-10-CM | POA: Diagnosis present

## 2023-07-06 DIAGNOSIS — Z91013 Allergy to seafood: Secondary | ICD-10-CM | POA: Diagnosis not present

## 2023-07-06 DIAGNOSIS — Z888 Allergy status to other drugs, medicaments and biological substances status: Secondary | ICD-10-CM | POA: Diagnosis not present

## 2023-07-06 DIAGNOSIS — J189 Pneumonia, unspecified organism: Secondary | ICD-10-CM | POA: Diagnosis present

## 2023-07-06 DIAGNOSIS — J9601 Acute respiratory failure with hypoxia: Secondary | ICD-10-CM | POA: Diagnosis present

## 2023-07-06 DIAGNOSIS — I5022 Chronic systolic (congestive) heart failure: Secondary | ICD-10-CM | POA: Diagnosis present

## 2023-07-06 DIAGNOSIS — I2609 Other pulmonary embolism with acute cor pulmonale: Secondary | ICD-10-CM | POA: Diagnosis not present

## 2023-07-06 DIAGNOSIS — I82412 Acute embolism and thrombosis of left femoral vein: Secondary | ICD-10-CM | POA: Diagnosis present

## 2023-07-06 DIAGNOSIS — Z6841 Body Mass Index (BMI) 40.0 and over, adult: Secondary | ICD-10-CM | POA: Diagnosis not present

## 2023-07-06 DIAGNOSIS — R0600 Dyspnea, unspecified: Principal | ICD-10-CM

## 2023-07-06 DIAGNOSIS — Z7952 Long term (current) use of systemic steroids: Secondary | ICD-10-CM | POA: Diagnosis not present

## 2023-07-06 DIAGNOSIS — J069 Acute upper respiratory infection, unspecified: Secondary | ICD-10-CM | POA: Diagnosis present

## 2023-07-06 DIAGNOSIS — Z8249 Family history of ischemic heart disease and other diseases of the circulatory system: Secondary | ICD-10-CM | POA: Diagnosis not present

## 2023-07-06 DIAGNOSIS — Z1152 Encounter for screening for COVID-19: Secondary | ICD-10-CM | POA: Diagnosis not present

## 2023-07-06 DIAGNOSIS — Z7901 Long term (current) use of anticoagulants: Secondary | ICD-10-CM | POA: Diagnosis not present

## 2023-07-06 DIAGNOSIS — D509 Iron deficiency anemia, unspecified: Secondary | ICD-10-CM | POA: Diagnosis present

## 2023-07-06 DIAGNOSIS — R9431 Abnormal electrocardiogram [ECG] [EKG]: Secondary | ICD-10-CM | POA: Diagnosis present

## 2023-07-06 DIAGNOSIS — I429 Cardiomyopathy, unspecified: Secondary | ICD-10-CM | POA: Diagnosis present

## 2023-07-06 DIAGNOSIS — N920 Excessive and frequent menstruation with regular cycle: Secondary | ICD-10-CM | POA: Diagnosis present

## 2023-07-06 LAB — CBC WITH DIFFERENTIAL/PLATELET
Abs Immature Granulocytes: 0.07 10*3/uL (ref 0.00–0.07)
Basophils Absolute: 0 10*3/uL (ref 0.0–0.1)
Basophils Relative: 0 %
Eosinophils Absolute: 0.2 10*3/uL (ref 0.0–0.5)
Eosinophils Relative: 2 %
HCT: 29.9 % — ABNORMAL LOW (ref 36.0–46.0)
Hemoglobin: 9.2 g/dL — ABNORMAL LOW (ref 12.0–15.0)
Immature Granulocytes: 1 %
Lymphocytes Relative: 15 %
Lymphs Abs: 1.3 10*3/uL (ref 0.7–4.0)
MCH: 21.9 pg — ABNORMAL LOW (ref 26.0–34.0)
MCHC: 30.8 g/dL (ref 30.0–36.0)
MCV: 71.2 fL — ABNORMAL LOW (ref 80.0–100.0)
Monocytes Absolute: 0.5 10*3/uL (ref 0.1–1.0)
Monocytes Relative: 6 %
Neutro Abs: 6.8 10*3/uL (ref 1.7–7.7)
Neutrophils Relative %: 76 %
Platelets: 313 10*3/uL (ref 150–400)
RBC: 4.2 MIL/uL (ref 3.87–5.11)
RDW: 22.5 % — ABNORMAL HIGH (ref 11.5–15.5)
WBC: 8.9 10*3/uL (ref 4.0–10.5)
nRBC: 0.7 % — ABNORMAL HIGH (ref 0.0–0.2)

## 2023-07-06 LAB — PROCALCITONIN: Procalcitonin: 0.18 ng/mL

## 2023-07-06 LAB — BASIC METABOLIC PANEL
Anion gap: 11 (ref 5–15)
BUN: 9 mg/dL (ref 6–20)
CO2: 28 mmol/L (ref 22–32)
Calcium: 9.1 mg/dL (ref 8.9–10.3)
Chloride: 103 mmol/L (ref 98–111)
Creatinine, Ser: 0.74 mg/dL (ref 0.44–1.00)
GFR, Estimated: >60 mL/min (ref >60)
Glucose, Bld: 100 mg/dL — ABNORMAL HIGH (ref 70–99)
Potassium: 3.5 mmol/L (ref 3.5–5.1)
Sodium: 142 mmol/L (ref 135–145)

## 2023-07-06 LAB — RESP PANEL BY RT-PCR (RSV, FLU A&B, COVID)  RVPGX2
Influenza A by PCR: NEGATIVE
Influenza B by PCR: NEGATIVE
Resp Syncytial Virus by PCR: NEGATIVE
SARS Coronavirus 2 by RT PCR: NEGATIVE

## 2023-07-06 LAB — HEPARIN LEVEL (UNFRACTIONATED): Heparin Unfractionated: <0.1 [IU]/mL — ABNORMAL LOW (ref 0.30–0.70)

## 2023-07-06 LAB — I-STAT CG4 LACTIC ACID, ED
Lactic Acid, Venous: 0.8 mmol/L (ref 0.5–1.9)
Lactic Acid, Venous: 0.8 mmol/L (ref 0.5–1.9)

## 2023-07-06 LAB — TROPONIN I (HIGH SENSITIVITY)
Troponin I (High Sensitivity): 75 ng/L — ABNORMAL HIGH (ref <18)
Troponin I (High Sensitivity): 243 ng/L (ref <18)

## 2023-07-06 LAB — APTT: aPTT: 59 s — ABNORMAL HIGH (ref 24–36)

## 2023-07-06 LAB — BRAIN NATRIURETIC PEPTIDE: B Natriuretic Peptide: 56.7 pg/mL (ref 0.0–100.0)

## 2023-07-06 LAB — HCG, SERUM, QUALITATIVE: Preg, Serum: NEGATIVE

## 2023-07-06 LAB — MRSA NEXT GEN BY PCR, NASAL: MRSA by PCR Next Gen: NOT DETECTED

## 2023-07-06 LAB — GLUCOSE, CAPILLARY: Glucose-Capillary: 149 mg/dL — ABNORMAL HIGH (ref 70–99)

## 2023-07-06 MED ORDER — IOHEXOL 350 MG/ML SOLN
60.0000 mL | Freq: Once | INTRAVENOUS | Status: AC | PRN
  Administered 2023-07-06: 60 mL via INTRAVENOUS

## 2023-07-06 MED ORDER — SODIUM CHLORIDE 0.9 % IV SOLN
500.0000 mg | INTRAVENOUS | Status: DC
  Administered 2023-07-07 (×2): 500 mg via INTRAVENOUS
  Filled 2023-07-06 (×2): qty 5

## 2023-07-06 MED ORDER — IPRATROPIUM-ALBUTEROL 0.5-2.5 (3) MG/3ML IN SOLN
3.0000 mL | Freq: Once | RESPIRATORY_TRACT | Status: AC
  Administered 2023-07-06: 3 mL via RESPIRATORY_TRACT
  Filled 2023-07-06: qty 3

## 2023-07-06 MED ORDER — IPRATROPIUM-ALBUTEROL 0.5-2.5 (3) MG/3ML IN SOLN
3.0000 mL | Freq: Four times a day (QID) | RESPIRATORY_TRACT | Status: DC
  Administered 2023-07-06 – 2023-07-07 (×5): 3 mL via RESPIRATORY_TRACT
  Filled 2023-07-06 (×5): qty 3

## 2023-07-06 MED ORDER — METHYLPREDNISOLONE SODIUM SUCC 125 MG IJ SOLR
INTRAMUSCULAR | Status: AC
  Filled 2023-07-06: qty 2

## 2023-07-06 MED ORDER — IPRATROPIUM-ALBUTEROL 0.5-2.5 (3) MG/3ML IN SOLN: 3.0000 mL | Freq: Four times a day (QID) | RESPIRATORY_TRACT | Status: DC | PRN

## 2023-07-06 MED ORDER — ONDANSETRON HCL 4 MG/2ML IJ SOLN
4.0000 mg | Freq: Four times a day (QID) | INTRAMUSCULAR | Status: DC | PRN
  Filled 2023-07-06: qty 2

## 2023-07-06 MED ORDER — HEPARIN (PORCINE) 25000 UT/250ML-% IV SOLN
2100.0000 [IU]/h | INTRAVENOUS | Status: AC
  Administered 2023-07-06: 1400 [IU]/h via INTRAVENOUS
  Administered 2023-07-07: 1800 [IU]/h via INTRAVENOUS
  Administered 2023-07-08 (×2): 1900 [IU]/h via INTRAVENOUS
  Administered 2023-07-09 (×2): 2100 [IU]/h via INTRAVENOUS
  Filled 2023-07-06 (×6): qty 250

## 2023-07-06 MED ORDER — HEPARIN (PORCINE) 25000 UT/250ML-% IV SOLN
1400.0000 [IU]/h | INTRAVENOUS | Status: DC
  Filled 2023-07-06: qty 250

## 2023-07-06 MED ORDER — PANTOPRAZOLE SODIUM 40 MG PO TBEC: 40.0000 mg | DELAYED_RELEASE_TABLET | Freq: Every day | ORAL | Status: DC

## 2023-07-06 MED ORDER — HEPARIN BOLUS VIA INFUSION
5500.0000 [IU] | Freq: Once | INTRAVENOUS | Status: DC
  Filled 2023-07-06: qty 5500

## 2023-07-06 MED ORDER — METHYLPREDNISOLONE SODIUM SUCC 125 MG IJ SOLR
125.0000 mg | Freq: Once | INTRAMUSCULAR | Status: DC
  Filled 2023-07-06: qty 2

## 2023-07-06 MED ORDER — CEFTRIAXONE SODIUM 1 G IJ SOLR
1.0000 g | Freq: Once | INTRAMUSCULAR | Status: AC
  Administered 2023-07-06: 1 g via INTRAVENOUS
  Filled 2023-07-06: qty 10

## 2023-07-06 MED ORDER — SODIUM CHLORIDE 0.9 % IV SOLN
500.0000 mg | Freq: Once | INTRAVENOUS | Status: DC
  Filled 2023-07-06: qty 5

## 2023-07-06 MED ORDER — CEFTRIAXONE SODIUM 1 G IJ SOLR
1.0000 g | INTRAMUSCULAR | Status: DC
  Administered 2023-07-07: 1 g via INTRAVENOUS
  Filled 2023-07-06: qty 10

## 2023-07-06 MED ORDER — ALTEPLASE (PULMONARY EMBOLISM) INFUSION
50.0000 mg | Freq: Once | INTRAVENOUS | Status: AC
Start: 2023-07-06 — End: 2023-07-06
  Administered 2023-07-06: 50 mg via INTRAVENOUS
  Filled 2023-07-06: qty 50

## 2023-07-06 MED ORDER — CHLORHEXIDINE GLUCONATE CLOTH 2 % EX PADS
6.0000 | MEDICATED_PAD | Freq: Every day | CUTANEOUS | Status: DC
  Administered 2023-07-06 – 2023-07-10 (×5): 6 via TOPICAL

## 2023-07-06 MED ORDER — ACETAMINOPHEN 325 MG PO TABS: 650.0000 mg | ORAL_TABLET | ORAL | Status: DC | PRN

## 2023-07-06 MED ORDER — ORAL CARE MOUTH RINSE: 15.0000 mL | OROMUCOSAL | Status: DC | PRN

## 2023-07-06 NOTE — ED Notes (Signed)
 Pharmacist Jon assistance administering alteplase due to pump library differences and confirming administration requirements and pump settings.

## 2023-07-06 NOTE — ED Notes (Signed)
 Per lab, Troponin 243, Belfi, MD notified.

## 2023-07-06 NOTE — ED Triage Notes (Signed)
 Pt reporting URI and bilateral foot swelling and pain in R leg. These symptoms have been going on for a week.

## 2023-07-06 NOTE — ED Notes (Signed)
 Pt brought back to triage for reassessment. Pt went to the bathroom and then had labored breathing. On assessment pt oxygen saturations 72% with HR around 140. Pt acuity to be increased and charge RN made aware.

## 2023-07-06 NOTE — ED Provider Notes (Addendum)
 [place] EMERGENCY DEPARTMENT AT [facility] Provider Note   CSN: [id number] Arrival date & time: 07/05/23  2236     History  Chief Complaint  Patient presents with  . Leg Swelling         Amber Arroyo is a 28 y.o. female.  28 year old female here today for right leg pain.  While in our waiting room, patient became short of breath.  She does have a history of asthma.  Patient was noted to have O2 sats in the 70s.  She received albuterol , Solu-Medrol  while in triage, and said that she felt this improved her symptoms.       Home Medications Prior to Admission medications   Medication Sig Start Date End Date Taking? Authorizing Provider  acetaminophen  (TYLENOL ) 500 MG tablet Take 1,000 mg by mouth every 6 (six) hours as needed for mild pain (pain score 1-3) or moderate pain (pain score 4-6).    [provider]  albuterol  (VENTOLIN  HFA) 108 (90 Base) MCG/ACT inhaler Inhale 2 puffs into the lungs every 4 (four) hours as needed for wheezing or shortness of breath. 06/29/23   Arloa Suzen RAMAN, NP  amoxicillin  (AMOXIL ) 875 MG tablet Take 1 tablet (875 mg total) by mouth 2 (two) times daily for 10 days. 06/29/23 07/09/23  Arloa Suzen RAMAN, NP  betamethasone, augmented, (DIPROLENE) 0.05 % lotion Apply 1 Application topically daily.    [provider]  EPINEPHrine  0.3 mg/0.3 mL IJ SOAJ injection Inject 0.3 mg into the muscle See admin instructions. 04/08/23   Ghimire, Kuber, MD  FEROSUL 325 (65 Fe) MG tablet Take 325 mg by mouth daily. 12/14/22   [provider]  fluticasone  (CUTIVATE ) 0.05 % cream Apply 1 Application topically 2 (two) times daily.    [provider]  hydrochlorothiazide  (HYDRODIURIL ) 25 MG tablet Take 25 mg by mouth daily. 03/31/23   [provider]  hydrOXYzine (ATARAX) 25 MG tablet Take 25 mg by mouth as needed. 03/31/23   [provider]  norethindrone (AYGESTIN) 5 MG tablet Take 5 mg by mouth 2 (two) times  daily. 12/09/22   [provider]  ondansetron  (ZOFRAN -ODT) 4 MG disintegrating tablet Take 1 tablet (4 mg total) by mouth every 8 (eight) hours as needed. 04/02/23   Billy Asberry FALCON, PA-C  predniSONE  (DELTASONE ) 20 MG tablet Take 2 tablets (40 mg total) by mouth daily with breakfast. 06/29/23   Arloa Suzen RAMAN, NP  promethazine -dextromethorphan  (PROMETHAZINE -DM) 6.25-15 MG/5ML syrup Take 5 mLs by mouth 4 (four) times daily as needed for cough. 06/29/23   Arloa Suzen RAMAN, NP  triamcinolone  cream (KENALOG ) 0.1 % APPLY EXTERNALLY TO THE AFFECTED AREA TWICE DAILY Patient taking differently: Apply 1 application  topically See admin instructions. Apply to affected areas 2 times a day 02/25/18   Newlin, Enobong, MD      Allergies    Fish-derived products, Shellfish-derived products, and Tree extract    Review of Systems   Review of Systems  Physical Exam Updated Vital Signs BP 118/80 (BP Location: Right Arm)   Pulse (!) 136   Temp (!) 97.5 F (36.4 C)   Resp (!) 21   LMP 06/23/2023   SpO2 95%  Physical Exam Vitals reviewed.  Constitutional:      Appearance: She is not toxic-appearing.  Cardiovascular:     Rate and Rhythm: Tachycardia present.  Pulmonary:     Breath sounds: No stridor. Wheezing present.  Abdominal:     Palpations: Abdomen is soft.  Musculoskeletal:        General: No swelling or deformity.  Skin:    General: Skin is warm.  Neurological:     General: No focal deficit present.     Mental Status: She is alert.    ED Results / Procedures / Treatments   Labs (all labs ordered are listed, but only abnormal results are displayed) Labs Reviewed  BASIC METABOLIC PANEL - Abnormal; Notable for the following components:      Result Value   Glucose, Bld 100 (*)    All other components within normal limits  CBC WITH DIFFERENTIAL/PLATELET - Abnormal; Notable for the following components:   Hemoglobin 9.2 (*)    HCT 29.9 (*)    MCV 71.2 (*)    MCH 21.9 (*)     RDW 22.5 (*)    nRBC 0.7 (*)    All other components within normal limits  RESP PANEL BY RT-PCR (RSV, FLU A&B, COVID)  RVPGX2  HCG, SERUM, QUALITATIVE  BRAIN NATRIURETIC PEPTIDE  I-STAT CG4 LACTIC ACID, ED  I-STAT CG4 LACTIC ACID, ED  TROPONIN I (HIGH SENSITIVITY)    EKG None  Radiology DG Chest 2 View Result Date: 07/06/2023 CLINICAL DATA:  28 year old female with history of cough and shortness of breath. EXAM: CHEST - 2 VIEW COMPARISON:  Chest x-ray 09/18/2020. FINDINGS: Lung volumes are normal. Bibasilar opacities which may reflect areas of atelectasis and/or consolidation (right-greater-than-left). Small right pleural effusion. No definite left pleural effusion. No pneumothorax. No evidence of pulmonary edema. Heart size is upper limits of normal. Upper mediastinal contours are within normal limits. IMPRESSION: 1. Bibasilar opacities, most evident in the right lower lobe, which may reflect areas of atelectasis and/or consolidation. Small right pleural effusion. Followup PA and lateral chest X-ray is recommended in 3-4 weeks following trial of antibiotic therapy to ensure resolution. Electronically Signed   By: Toribio Aye M.D.   On: 07/06/2023 06:15    Procedures Procedures    Medications Ordered in ED Medications  methylPREDNISolone  sodium succinate (SOLU-MEDROL ) 125 mg/2 mL injection 125 mg (has no administration in time range)  ipratropium-albuterol  (DUONEB) 0.5-2.5 (3) MG/3ML nebulizer solution 3 mL (3 mLs Nebulization Given 07/06/23 1451)    ED Course/ Medical Decision Making/ A&P                                 Medical Decision Making 28 year old female here today with leg pain, shortness of breath.  Differential diagnoses include PE, asthma exacerbation, pneumonia, pulmonary edema, less likely myocarditis.  Plan -patient with true hypoxia, fortunately did get better with albuterol . With her history, simplest explanation would be asthma exacerbation, however with her  leg pain, significant tachycardia and hypoxia, must exclude PE.  Will send patient over for CT as assessing for PE is more important at this time than waiting for D-dimer. EKG not done in triage, requested EKG.  Lower suspicion for pneumonia despite CXR done earlier. No cough, no fever, no leukocytosis.  Reassessment 4 PM-patient be signed out to Dr. Lenor pending CT imaging, ultrasound, reassessment.  Troponin elevated at 75.  May be rate related, patient low risk for ACS, may be heart strain.  Still pending CTA. Continues to do well with current respiratory support.  Amount and/or Complexity of Data Reviewed Labs: ordered. Radiology: ordered.  Risk Prescription drug management.           Final Clinical Impression(s) / ED Diagnoses Final  diagnoses:  Dyspnea, unspecified type    Rx / DC Orders ED Discharge Orders     None         Mannie Fairy DASEN, DO 07/06/23 1542    Mannie Fairy T, DO 07/06/23 1557    Mannie Fairy T, DO 07/06/23 1821    Mannie Fairy T, DO 07/06/23 1821

## 2023-07-06 NOTE — ED Provider Notes (Signed)
 Care was taken over from Dr. Mannie.  Patient has had recent cough and congestion.  Had worsening shortness of breath today.  Was found to be hypoxic in the waiting room.  Was placed on oxygen and given nebulizer treatments.  This did improve her breathing.  Her troponins are elevated.  CT scan shows evidence of saddle embolus with smaller bilateral pulmonary emboli.  There is evidence of right heart strain.  She also has a DVT in her left leg.  Patient was started on heparin .  There was noted to be some areas of pneumonia on her CT as well and she was started on IV Rocephin  and Zithromax .  I consulted with CCM who will admit the patient for further treatment.  CRITICAL CARE Performed by: Andrea Ness Total critical care time: 30 minutes Critical care time was exclusive of separately billable procedures and treating other patients. Critical care was necessary to treat or prevent imminent or life-threatening deterioration. Critical care was time spent personally by me on the following activities: development of treatment plan with patient and/or surrogate as well as nursing, discussions with consultants, evaluation of patient's response to treatment, examination of patient, obtaining history from patient or surrogate, ordering and performing treatments and interventions, ordering and review of laboratory studies, ordering and review of radiographic studies, pulse oximetry and re-evaluation of patient's condition.    Ness Andrea, MD 07/06/23 [phone]

## 2023-07-06 NOTE — ED Notes (Signed)
 Request assistance for IV access, 2 nurses attempted , charge nurse notified.

## 2023-07-06 NOTE — Progress Notes (Signed)
 PHARMACY - ANTICOAGULATION CONSULT NOTE  Pharmacy Consult for Heparin  Indication: pulmonary embolus  Allergies  Allergen Reactions   Fish-Derived Products Shortness Of Breath, Swelling and Other (See Comments)    Lips swell   Shellfish-Derived Products Shortness Of Breath, Swelling and Other (See Comments)    Lips swell   Tree Extract Other (See Comments)    Patient was told she is allergic to trees    Patient Measurements: Height: 5' 2.5 (158.8 cm) Weight: 122.5 kg (270 lb 1 oz) IBW/kg (Calculated) : 51.25 Heparin  Dosing Weight: 81.6kg  Vital Signs: Temp: 98 F (36.7 C) (01/14 2029) Temp Source: Oral (01/14 2029) BP: 156/106 (01/14 2045) Pulse Rate: 128 (01/14 2053)  Labs: Recent Labs    07/06/23 0530 07/06/23 1500 07/06/23 1620 07/06/23 2219  HGB 9.2*  --   --   --   HCT 29.9*  --   --   --   PLT 313  --   --   --   HEPARINUNFRC  --   --   --  <0.10*  CREATININE 0.74  --   --   --   TROPONINIHS  --  75* 243*  --     Estimated Creatinine Clearance: 133.1 mL/min (by C-G formula based on SCr of 0.74 mg/dL).   Medical History: Past Medical History:  Diagnosis Date   Allergy    eczema, seasonal allergies   Asthma    Eczema    Essential hypertension    Obesity, Class III, BMI 40-49.9 (morbid obesity) (HCC) 11/18/2020   Medications:  Scheduled:   Chlorhexidine  Gluconate Cloth  6 each Topical Daily   ipratropium-albuterol   3 mL Nebulization Q6H   methylPREDNISolone  (SOLU-MEDROL ) injection  125 mg Intravenous Once   methylPREDNISolone  sodium succinate       Infusions:   azithromycin      [START ON 07/07/2023] cefTRIAXone  (ROCEPHIN )  IV     Assessment: 27YOF presenting with R leg pain who became SOB. CTA found to be positive for acute saddle PE with evidence of R heart strain. Not on [facility] prior to admission. Pharmacy has been consulted to dose heparin .  Prior to heparin  gtt initiation decision to start tPA 50mg  over 2h, now s/p infusion and to start heparin   gtt with aPTT <80  Goal of Therapy:  Heparin  level 0.3-0.7 units/ml Monitor platelets by anticoagulation protocol: Yes   Plan:  Heparin  gtt at 1400 units/hr, no bolus F/u 6 hour heparin  level  Dorn Poot, PharmD, [facility] Clinical Pharmacist ED Pharmacist Phone # [phone] 07/06/2023 10:50 PM

## 2023-07-06 NOTE — Progress Notes (Signed)
 eLink Physician-Brief Progress Note Patient Name: Amber Arroyo DOB: [birth year] MRN: [id number]   Date of Service  07/06/2023  HPI/Events of Note  28 year old woman with past medical history of asthma on as needed albuterol  who presents with shortness of breath found to have an acute community-acquired pneumonia and an acute pulmonary embolus.  Status post thrombolysis  Patient tachypneic, tachycardic, hypertensive.  Saturating 97% on nonrebreather.  Intolerant of high flow nasal cannula in the setting of nasal congestion.  Results consistent with mild electrolyte disturbances.  CBC with microcytic anemia.  Saddle PE with RV strain noted on CT PE.  No evidence of DVT.  eICU Interventions  Standard thrombolytic precautions for 24 hours with frequent neurological checks  Community-acquired coverage with ceftriaxone  and azithromycin   DVT therapy with heparin  infusion, status post thrombolysis GI prophylaxis not indicated, discontinue pantoprazole      Intervention Category Evaluation Type: New Patient Evaluation  Tasha Diaz 07/06/2023, 9:10 PM

## 2023-07-06 NOTE — ED Provider Triage Note (Signed)
 Emergency Medicine Provider Triage Evaluation Note  Amber Arroyo , a 28 y.o. female  was evaluated in triage.  Pt complains of right leg pain, has a history of asthma became short of breath while in our waiting room.  Reportedly was diagnosed with a URI 1 week ago.  Started develop pain in the right leg.  Feeling short of breath today.  Review of Systems    Physical Exam  BP 118/80 (BP Location: Right Arm)   Pulse (!) 136   Temp (!) 97.5 F (36.4 C)   Resp (!) 21   LMP 06/23/2023   SpO2 95%  Gen:   Awake, no distress   Resp:  Normal effort, tachypneic.  Mildly diminished breath sounds bilaterally. MSK:   Moves extremities without difficulty  Other:    Medical Decision Making  Medically screening exam initiated at 2:30 PM.  Appropriate orders placed.  Amber Arroyo was informed that the remainder of the evaluation will be completed by another provider, this initial triage assessment does not replace that evaluation, and the importance of remaining in the ED until their evaluation is complete.  Alerted by nursing staff that we had a 28 year old female in the waiting room who had O2 sats in the 70s.  Immediately went to evaluate the patient.  Patient currently on nonrebreather, satting well.  Patient does have mildly diminished air entry bilaterally.  Does have a history of asthma.  Right leg is tender.  With her being tachypneic, tachycardic and hypoxic, will fully work the patient up for pulmonary embolism and treat as an asthma exacerbation.  Her chest x-ray showed possible consolidation but patient has not had fever, been afebrile here.  No leukocytosis.   Mannie Pac T, DO 07/06/23 1432

## 2023-07-06 NOTE — Progress Notes (Signed)
 BLE venous duplex has been completed.  Preliminary findings given to Dr. Fredderick Phenix.   Results can be found under chart review under CV PROC. 07/06/2023 5:12 PM Abran Gavigan RVT, RDMS

## 2023-07-06 NOTE — Progress Notes (Signed)
 NAME:  Amber Arroyo, MRN:  [id number], DOB:  [birth year], LOS: 0 ADMISSION DATE:  07/06/2023, CONSULTATION DATE:  07/06/2023 REFERRING MD:  EDP, CHIEF COMPLAINT:  shortness of breath   History of Present Illness:  Amber Arroyo is a 28 year old woman with past medical history of asthma on as needed albuterol  who presents with shortness of breath.  Her symptoms started about 10 days ago with chest congestion, cough productive with sputum, shortness of breath.  She was seen in urgent care and diagnosed with URI COVID and flu testing was negative.  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  She notes for the past week she has been essentially bedbound and only getting up to go to the bathroom.  She denies any prolonged plane rides or car trips.  Her URI symptoms started about 10 days ago; however, she notes 2 days ago she had a leg swelling and worsening shortness of breath.  Today she tried to stand up and almost passed on the bathroom which is why she called 911.  She was given an albuterol  inhaler in the urgent care but this did not improve her symptoms.  On presentation to the ED she was found to be hypoxemic, tachycardic.  CT PE study confirmed acute pulmonary embolism with saddle distribution and right heart strain.  She also has a questionable wedge-shaped infarct versus pneumonia in the right upper lobe.  Troponin was elevated.  PCCM was consulted to evaluate for management of acute pulmonary embolism with submassive features.  She is not on OCPs.  She was on a progesterone only birth control pill to help regulate her periods but this was last taken in August 2024.  There is no family or personal medical history of hypercoagulable disorder or venous thromboembolism.  She denies any history of intracranial hemorrhage or recent surgery.  Pertinent  Medical History  Asthma  Social history: Non smoker Works at daycare, works at [employer]: Including  procedures, antibiotic start and stop dates in addition to other pertinent events     Interim History / Subjective:    Objective   Blood pressure (!) 143/98, pulse (!) 123, temperature 98.3 F (36.8 C), temperature source Oral, resp. rate (!) 23, height 5' 2.5 (1.588 m), weight 122.5 kg, last menstrual period 06/23/2023, SpO2 96%.       No intake or output data in the 24 hours ending 07/06/23 1834 Filed Weights   07/06/23 1700  Weight: 122.5 kg    Examination: General: Acutely ill-appearing, short of breath, tachypneic on oxygen HENT: Nasal congestion Lungs: Diminished, clear to auscultation, tachypneic, no wheeze Cardiovascular: Tachycardic, regular Abdomen: Obese, soft Extremities: Bilateral lower extremity edema left greater than right Neuro: Normal speech no focal asymmetry  CT scan personally reviewed which shows bilateral saddle pulmonary embolism, large consolidation in the superior subsegment of the right lower lobe.   Resolved Hospital Problem list     Assessment & Plan:   Acute pulmonary embolism, provoked, with submassive features Community-acquired pneumonia  Patient has high risk sPesi score.  Given her high risk features with hypoxemia, tachycardia, presyncopal event as well as evidence of myocardial necrosis and right heart strain we do reviewed the options of heparin  alone versus half dose systemic thrombolysis therapy with alteplase .  Reviewed the risks and benefits of thrombolytic therapy.  She has no contraindications to proceed.  Plan is to admit to ICU and administer half dose systemic thrombolysis at 50 mg followed by heparin  drip.  We will also treat for pneumonia with ceftriaxone  azithromycin .  I suspect this is a community-acquired pneumonia given clinical history rather than pulmonary infarct.  She denies significant pain at the distribution of the consolidation.  Check procalcitonin with a.m. labs.  Titrate down oxygen for goal saturations over 90%.   Will need only 6 months of anticoagulation after discharge.   Best Practice (right click and Reselect all SmartList Selections daily)   Diet/type: Regular consistency (see orders) DVT prophylaxis systemic heparin  Pressure ulcer(s): pressure ulcer assessment deferred  GI prophylaxis: N/A Lines: N/A Foley:  N/A Code Status:  full code Last date of multidisciplinary goals of care discussion []    Labs   CBC: Recent Labs  Lab 07/06/23 0530  WBC 8.9  NEUTROABS 6.8  HGB 9.2*  HCT 29.9*  MCV 71.2*  PLT 313    Basic Metabolic Panel: Recent Labs  Lab 07/06/23 0530  NA 142  K 3.5  CL 103  CO2 28  GLUCOSE 100*  BUN 9  CREATININE 0.74  CALCIUM 9.1   GFR: Estimated Creatinine Clearance: 133.1 mL/min (by C-G formula based on SCr of 0.74 mg/dL). Recent Labs  Lab 07/06/23 0530 07/06/23 0535 07/06/23 0825  WBC 8.9  --   --   LATICACIDVEN  --  0.8 0.8    Liver Function Tests: No results for input(s): AST, ALT, ALKPHOS, BILITOT, PROT, ALBUMIN in the last 168 hours. No results for input(s): LIPASE, AMYLASE in the last 168 hours. No results for input(s): AMMONIA in the last 168 hours.  ABG    Component Value Date/Time   TCO2 27 04/06/2023 1757     Coagulation Profile: No results for input(s): INR, PROTIME in the last 168 hours.  Cardiac Enzymes: No results for input(s): CKTOTAL, CKMB, CKMBINDEX, TROPONINI in the last 168 hours.  HbA1C: No results found for: HGBA1C  CBG: No results for input(s): GLUCAP in the last 168 hours.  Review of Systems:   Shortness of breath, cough with sputum production, pre-syncope  Past Medical History:  She,  has a past medical history of Allergy, Asthma, Eczema, Essential hypertension, and Obesity, Class III, BMI 40-49.9 (morbid obesity) (HCC) (11/18/2020).   Surgical History:   Past Surgical History:  Procedure Laterality Date   INCISION AND DRAINAGE ABSCESS Left 02/04/2013   Procedure:  INCISION AND DRAINAGE POSTERIOR NECK ABSCESS;  Surgeon: Marlyce Finer, MD;  Location: [facility] OR;  Service: ENT;  Laterality: Left;   MOUTH SURGERY     Wisdom Teeth Extraction     Social History:   reports that she has never smoked. She has never used smokeless tobacco. She reports that she does not drink alcohol and does not use drugs.   Family History:  Her family history includes Asthma in her mother; Eczema in her mother; Hypertension in her father and mother.   Allergies Allergies  Allergen Reactions   Fish-Derived Products Shortness Of Breath, Swelling and Other (See Comments)    Lips swell   Shellfish-Derived Products Shortness Of Breath, Swelling and Other (See Comments)    Lips swell   Tree Extract Other (See Comments)    Patient was told she is allergic to trees     Home Medications  Prior to Admission medications   Medication Sig Start Date End Date Taking? Authorizing Provider  acetaminophen  (TYLENOL ) 500 MG tablet Take 1,000 mg by mouth every 6 (six) hours as needed for mild pain (pain score 1-3) or moderate pain (pain score 4-6).    [provider]  albuterol  (VENTOLIN  HFA) 108 (90 Base) MCG/ACT inhaler Inhale 2 puffs into the lungs every 4 (four) hours as needed for wheezing or shortness of breath. 06/29/23   Arloa Suzen RAMAN, NP  amoxicillin  (AMOXIL ) 875 MG tablet Take 1 tablet (875 mg total) by mouth 2 (two) times daily for 10 days. 06/29/23 07/09/23  Arloa Suzen RAMAN, NP  betamethasone, augmented, (DIPROLENE) 0.05 % lotion Apply 1 Application topically daily.    [provider]  EPINEPHrine  0.3 mg/0.3 mL IJ SOAJ injection Inject 0.3 mg into the muscle See admin instructions. 04/08/23   Ghimire, Kuber, MD  FEROSUL 325 (65 Fe) MG tablet Take 325 mg by mouth daily. 12/14/22   [provider]  fluticasone  (CUTIVATE ) 0.05 % cream Apply 1 Application topically 2 (two) times daily.    [provider]  hydrochlorothiazide  (HYDRODIURIL ) 25 MG  tablet Take 25 mg by mouth daily. 03/31/23   [provider]  hydrOXYzine (ATARAX) 25 MG tablet Take 25 mg by mouth as needed. 03/31/23   [provider]  norethindrone (AYGESTIN) 5 MG tablet Take 5 mg by mouth 2 (two) times daily. 12/09/22   [provider]  ondansetron  (ZOFRAN -ODT) 4 MG disintegrating tablet Take 1 tablet (4 mg total) by mouth every 8 (eight) hours as needed. 04/02/23   Billy Asberry FALCON, PA-C  predniSONE  (DELTASONE ) 20 MG tablet Take 2 tablets (40 mg total) by mouth daily with breakfast. 06/29/23   Arloa Suzen RAMAN, NP  promethazine -dextromethorphan  (PROMETHAZINE -DM) 6.25-15 MG/5ML syrup Take 5 mLs by mouth 4 (four) times daily as needed for cough. 06/29/23   Arloa Suzen RAMAN, NP  triamcinolone  cream (KENALOG ) 0.1 % APPLY EXTERNALLY TO THE AFFECTED AREA TWICE DAILY Patient taking differently: Apply 1 application  topically See admin instructions. Apply to affected areas 2 times a day 02/25/18   Newlin, Enobong, MD     Critical care time:       The patient is critically ill due to pulmonary embolism.  Critical care was necessary to treat or prevent imminent or life-threatening deterioration.  Critical care was time spent personally by me on the following activities: development of treatment plan with patient and/or surrogate as well as nursing, discussions with consultants, evaluation of patient's response to treatment, examination of patient, obtaining history from patient or surrogate, ordering and performing treatments and interventions, ordering and review of laboratory studies, ordering and review of radiographic studies, pulse oximetry, re-evaluation of patient's condition and participation in multidisciplinary rounds.   Critical Care Time devoted to patient care services described in this note is 50 minutes. This time reflects time of care of this signee Shanika Levings S Eyvonne Burchfield . This critical care time does not reflect separately billable procedures or  procedure time, teaching time or supervisory time of PA/NP/Med student/Med Resident etc but could involve care discussion time.       Verdon RAMAN Gore [facility] 07/06/2023 6:34 PM  Pager: see AMION  If no response to pager , please call critical care on call (see AMION) until 7pm After 7:00 pm call Elink

## 2023-07-06 NOTE — Progress Notes (Addendum)
 PHARMACY - ANTICOAGULATION CONSULT NOTE  Pharmacy Consult for Heparin  Indication: pulmonary embolus  Allergies  Allergen Reactions   Fish-Derived Products Shortness Of Breath, Swelling and Other (See Comments)    Lips swell   Shellfish-Derived Products Shortness Of Breath, Swelling and Other (See Comments)    Lips swell   Tree Extract Other (See Comments)    Patient was told she is allergic to trees    Patient Measurements: Height: 5' 2.5 (158.8 cm) Weight: 122.5 kg (270 lb 1 oz) IBW/kg (Calculated) : 51.25 Heparin  Dosing Weight: 81.6kg  Vital Signs: Temp: 98.3 F (36.8 C) (01/14 1551) Temp Source: Oral (01/14 1551) BP: 143/98 (01/14 1551) Pulse Rate: 123 (01/14 1551)  Labs: Recent Labs    07/06/23 0530 07/06/23 1500 07/06/23 1620  HGB 9.2*  --   --   HCT 29.9*  --   --   PLT 313  --   --   CREATININE 0.74  --   --   TROPONINIHS  --  75* 243*    Estimated Creatinine Clearance: 133.1 mL/min (by C-G formula based on SCr of 0.74 mg/dL).   Medical History: Past Medical History:  Diagnosis Date   Allergy    eczema, seasonal allergies   Asthma    Eczema    Essential hypertension    Obesity, Class III, BMI 40-49.9 (morbid obesity) (HCC) 11/18/2020   Medications:  Scheduled:   heparin   5,500 Units Intravenous Once   methylPREDNISolone  (SOLU-MEDROL ) injection  125 mg Intravenous Once   Infusions:   azithromycin  (ZITHROMAX ) 500 mg in sodium chloride  0.9 % 250 mL IVPB     cefTRIAXone  (ROCEPHIN )  IV     heparin      Assessment: 27YOF presenting with R leg pain who became SOB. CTA found to be positive for acute saddle PE with evidence of R heart strain. Not on [facility] [place] prior to admission. Pharmacy has been consulted to dose heparin .  Hgb 9.2, plt 313  Goal of Therapy:  Heparin  level 0.3-0.7 units/ml Monitor platelets by anticoagulation protocol: Yes   Plan:  Give 5500 units bolus x 1 Start heparin  infusion at 1400 units/hr Check anti-Xa level in 6 hours and  daily while on heparin  Continue to monitor H&H and platelets  Harlene DEL Jaelen Soth 07/06/2023,5:36 PM

## 2023-07-06 NOTE — Progress Notes (Signed)
 Pt placed on HFNC 14L with Sp02 in the 80's. Pt switched to HHFNC 35L /100% with Sp02 in the upper 86-88%. Pt is mouth breathing due to nasal congestion, pt encouraged to breath through her nose with no improvement. Pt placed on NRB. Sp02 in the 96-98%.

## 2023-07-07 ENCOUNTER — Other Ambulatory Visit (HOSPITAL_COMMUNITY): Payer: Self-pay

## 2023-07-07 ENCOUNTER — Inpatient Hospital Stay (HOSPITAL_COMMUNITY): Payer: Managed Care, Other (non HMO)

## 2023-07-07 ENCOUNTER — Telehealth (HOSPITAL_COMMUNITY): Payer: Self-pay | Admitting: Pharmacy Technician

## 2023-07-07 DIAGNOSIS — I2602 Saddle embolus of pulmonary artery with acute cor pulmonale: Secondary | ICD-10-CM | POA: Diagnosis not present

## 2023-07-07 DIAGNOSIS — I2609 Other pulmonary embolism with acute cor pulmonale: Secondary | ICD-10-CM | POA: Diagnosis not present

## 2023-07-07 LAB — CBC
HCT: 28.5 % — ABNORMAL LOW (ref 36.0–46.0)
Hemoglobin: 8.6 g/dL — ABNORMAL LOW (ref 12.0–15.0)
MCH: 20.8 pg — ABNORMAL LOW (ref 26.0–34.0)
MCHC: 30.2 g/dL (ref 30.0–36.0)
MCV: 69 fL — ABNORMAL LOW (ref 80.0–100.0)
Platelets: 274 10*3/uL (ref 150–400)
RBC: 4.13 MIL/uL (ref 3.87–5.11)
RDW: 21.3 % — ABNORMAL HIGH (ref 11.5–15.5)
WBC: 10.4 10*3/uL (ref 4.0–10.5)
nRBC: 0.3 % — ABNORMAL HIGH (ref 0.0–0.2)

## 2023-07-07 LAB — ECHOCARDIOGRAM COMPLETE
AR max vel: 2.1 cm2
AV Area VTI: 2.24 cm2
AV Area mean vel: 2.11 cm2
AV Mean grad: 3 mm[Hg]
AV Peak grad: 6.1 mm[Hg]
Ao pk vel: 1.23 m/s
Area-P 1/2: 8.43 cm2
Calc EF: 37.1 %
Height: 62.5 in
S' Lateral: 4 cm
Single Plane A2C EF: 38.7 %
Single Plane A4C EF: 37.8 %
Weight: 4321.02 [oz_av]

## 2023-07-07 LAB — GLUCOSE, CAPILLARY
Glucose-Capillary: 88 mg/dL (ref 70–99)
Glucose-Capillary: 89 mg/dL (ref 70–99)
Glucose-Capillary: 112 mg/dL — ABNORMAL HIGH (ref 70–99)

## 2023-07-07 LAB — BASIC METABOLIC PANEL
Anion gap: 9 (ref 5–15)
BUN: 9 mg/dL (ref 6–20)
CO2: 28 mmol/L (ref 22–32)
Calcium: 8.5 mg/dL — ABNORMAL LOW (ref 8.9–10.3)
Chloride: 105 mmol/L (ref 98–111)
Creatinine, Ser: 0.63 mg/dL (ref 0.44–1.00)
GFR, Estimated: >60 mL/min (ref >60)
Glucose, Bld: 103 mg/dL — ABNORMAL HIGH (ref 70–99)
Potassium: 3.6 mmol/L (ref 3.5–5.1)
Sodium: 142 mmol/L (ref 135–145)

## 2023-07-07 LAB — HEPARIN LEVEL (UNFRACTIONATED)
Heparin Unfractionated: 0.14 [IU]/mL — ABNORMAL LOW (ref 0.30–0.70)
Heparin Unfractionated: 0.33 [IU]/mL (ref 0.30–0.70)
Heparin Unfractionated: 0.42 [IU]/mL (ref 0.30–0.70)

## 2023-07-07 LAB — MAGNESIUM: Magnesium: 2.4 mg/dL (ref 1.7–2.4)

## 2023-07-07 LAB — CG4 I-STAT (LACTIC ACID): Lactic Acid, Venous: 0.6 mmol/L (ref 0.5–1.9)

## 2023-07-07 LAB — PHOSPHORUS: Phosphorus: 5.2 mg/dL — ABNORMAL HIGH (ref 2.5–4.6)

## 2023-07-07 MED ORDER — HEPARIN BOLUS VIA INFUSION
1000.0000 [IU] | Freq: Once | INTRAVENOUS | Status: AC
  Administered 2023-07-07: 1000 [IU] via INTRAVENOUS
  Filled 2023-07-07: qty 1000

## 2023-07-07 MED ORDER — IPRATROPIUM-ALBUTEROL 0.5-2.5 (3) MG/3ML IN SOLN
3.0000 mL | Freq: Two times a day (BID) | RESPIRATORY_TRACT | Status: DC
  Administered 2023-07-08 – 2023-07-11 (×7): 3 mL via RESPIRATORY_TRACT
  Filled 2023-07-07 (×7): qty 3

## 2023-07-07 MED ORDER — POTASSIUM CHLORIDE CRYS ER 20 MEQ PO TBCR
40.0000 meq | EXTENDED_RELEASE_TABLET | Freq: Once | ORAL | Status: AC
  Administered 2023-07-07: 40 meq via ORAL
  Filled 2023-07-07: qty 2

## 2023-07-07 MED ORDER — PERFLUTREN LIPID MICROSPHERE
1.0000 mL | INTRAVENOUS | Status: AC | PRN
Start: 2023-07-07 — End: 2023-07-07
  Administered 2023-07-07: 3 mL via INTRAVENOUS

## 2023-07-07 MED ORDER — HEPARIN BOLUS VIA INFUSION
1500.0000 [IU] | Freq: Once | INTRAVENOUS | Status: DC
  Filled 2023-07-07: qty 1500

## 2023-07-07 NOTE — Progress Notes (Signed)
  Echocardiogram 2D Echocardiogram has been performed.  Amber Arroyo 07/07/2023, 8:32 AM

## 2023-07-07 NOTE — H&P (Signed)
 See progress note from 07/05/22 at 6:33 pm

## 2023-07-07 NOTE — Telephone Encounter (Signed)
 Patient Product/process development scientist completed.    The patient is insured through [employer]. Patient has [employer], may use a copay card, and/or apply for patient assistance if available.    Ran test claim for Eliquis  5 mg and Require Prior Authorization  Ran test claim for Xarelto 20 mg and Require Prior Authorization  This test claim was processed through [employer]- copay amounts may vary at other pharmacies due to [employer], or as the patient moves through the different stages of their insurance plan.     Morgan Arab, CPHT Pharmacy Technician III Certified Patient Advocate [facility] Pharmacy Patient Advocate Team Direct Number: [phone]  Fax: [phone]

## 2023-07-07 NOTE — Telephone Encounter (Signed)
 Pharmacy Patient Advocate Encounter  Received notification from [employer] that Prior Authorization for Eliquis  5MG  tablets has been APPROVED from 07/07/2023 to 07/06/2024. Ran test claim, Copay is $45.00. This test claim was processed through [facility]- copay amounts may vary at other pharmacies due to pharmacy/plan contracts, or as the patient moves through the different stages of their insurance plan.   PA #/Case ID/Reference #: [id number] Key: [id number]

## 2023-07-07 NOTE — Progress Notes (Signed)
 [facility] ADULT ICU REPLACEMENT PROTOCOL   The patient does apply for the [facility] Adult ICU Electrolyte Replacment Protocol based on the criteria listed below:   1.Exclusion criteria: TCTS, ECMO, Dialysis, and Myasthenia Gravis patients 2. Is GFR >/= 30 ml/min? Yes.    Patient's GFR today is >60 3. Is SCr </= 2? Yes.   Patient's SCr is 0.63 mg/dL 4. Did SCr increase >/= 0.5 in 24 hours? No. 5.Pt's weight >40kg  Yes.   6. Abnormal electrolyte(s): potassium 3.6  7. Electrolytes replaced per protocol 8.  Call MD STAT for K+ </= 2.5, Phos </= 1, or Mag </= 1 Physician:  protocol  Marva Sleight 07/07/2023 4:19 AM

## 2023-07-07 NOTE — TOC Initial Note (Signed)
 Transition of Care [facility]) - Initial/Assessment Note    Patient Details  Name: Amber Arroyo MRN: [id number] Date of Birth: 1996/01/29  [facility] CM/SW Contact:    Omie Bickers, RN Phone Number: 07/07/2023, 8:01 AM  Clinical Narrative:                  Patient admitted with PE, Currently on heparin  gtt. Will follow for DOAC support. Currently on supplemental oxygen, will follow  for oxygen support for home if needed.  Patient with coverage through [employer], PCP listed.    Expected Discharge Plan: Home/Self Care Barriers to Discharge: Continued Medical Work up   Patient Goals and CMS Choice            Expected Discharge Plan and Services                                              Prior Living Arrangements/Services                       Activities of Daily Living      Permission Sought/Granted                  Emotional Assessment              Admission diagnosis:  Pulmonary embolus (HCC) [I26.99] Dyspnea, unspecified type [R06.00] Patient Active Problem List   Diagnosis Date Noted   Pulmonary embolus (HCC) 07/06/2023   Anemia 04/06/2023   Essential hypertension 04/04/2023   Allergy 04/04/2023   Change in vision 01/03/2021   Cellulitis of right leg 11/18/2020   Sepsis (HCC) 11/18/2020   Obesity, Class III, BMI 40-49.9 (morbid obesity) (HCC) 11/18/2020   Asthma    Cellulitis 07/03/2017   Seasonal allergies 02/10/2016   Eczema 01/07/2016   Obesity 01/07/2016   Benign essential hypertension 07/17/2013   Abscess of neck 02/04/2013   PCP:  Isabelle Maple, PA-C Pharmacy:   [id number] - [place], [place] - [address] [place] [address] Phone: [phone] Fax: [phone]  [id number] - [place], [place] - [address] [place] [address] Phone: [phone] Fax: [phone]     Social Drivers of Health (SDOH) Social History: SDOH  Screenings   Food Insecurity: No Food Insecurity (04/06/2023)  Housing: Low Risk  (04/06/2023)  Transportation Needs: No Transportation Needs (04/06/2023)  Utilities: Not At Risk (04/06/2023)  Depression (PHQ2-9): Low Risk  (07/21/2021)  Tobacco Use: Low Risk  (07/06/2023)   SDOH Interventions:     Readmission Risk Interventions     No data to display

## 2023-07-07 NOTE — Progress Notes (Signed)
 NAME:  Amber Arroyo, MRN:  [id number], DOB:  [birth year], LOS: 1 ADMISSION DATE:  07/06/2023, CONSULTATION DATE:  07/06/2023 REFERRING MD:  EDP, CHIEF COMPLAINT:  shortness of breath   History of Present Illness:  Amber Arroyo is a 28 year old woman with past medical history of asthma on as needed albuterol  who presents with shortness of breath.    Her symptoms started about 10 days ago with chest congestion, cough productive with sputum, shortness of breath.  She was seen in urgent care and diagnosed with URI COVID and flu testing was negative.  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  She notes for the past week she has been essentially bedbound and only getting up to go to the bathroom.  She denies any prolonged plane rides or car trips.  Her URI symptoms started about 10 days ago; however, she notes 2 days ago she had a leg swelling and worsening shortness of breath.  Today she tried to stand up and almost passed on the bathroom which is why she called 911.  She was given an albuterol  inhaler in the urgent care but this did not improve her symptoms.  On presentation to the ED she was found to be hypoxemic, tachycardic.  CT PE study confirmed acute pulmonary embolism with saddle distribution and right heart strain.  She also has a questionable wedge-shaped infarct versus pneumonia in the right upper lobe.  Troponin was elevated.  PCCM was consulted to evaluate for management of acute pulmonary embolism with submassive features.  She is not on OCPs.  She was on a progesterone only birth control pill to help regulate her periods but this was last taken in August 2024.  There is no family or personal medical history of hypercoagulable disorder or venous thromboembolism.  She denies any history of intracranial hemorrhage or recent surgery.  Pertinent  Medical History  Asthma   Social history: Non smoker Works at daycare, works at [employer]: Including  procedures, antibiotic start and stop dates in addition to other pertinent events   07/06/23: Admission to ICU. Start of CAP ABX.  Interim History / Subjective:  She feels well this morning. Slept well last night. No acute complaints. Does note some bilateral leg pain, worse on L side. No dyspnea, chest pain, fevers, productive cough.  Objective   Blood pressure (!) 175/64, pulse (!) 114, temperature 98.6 F (37 C), temperature source Axillary, resp. rate (!) 28, height 5' 2.5" (1.588 m), weight 122.5 kg, last menstrual period 06/23/2023, SpO2 100%.    FiO2 (%):  [100 %] 100 %   Intake/Output Summary (Last 24 hours) at 07/07/2023 0730 Last data filed at 07/07/2023 0500 Gross per 24 hour  Intake 380.35 ml  Output 270 ml  Net 110.35 ml   Filed Weights   07/06/23 1700  Weight: 122.5 kg    Examination: General: Well-appearing, asleep and easily awakable HENT: Moist membranes, wearing non-rebreather Lungs: CTABL Cardiovascular: tachycardic with regular rhythm no MRG Abdomen: Soft, non-distended, normal bowel sounds Extremities: warm. L calf tender but no swelling vs R leg Neuro: AOx3 GU: deferred  Resolved Hospital Problem list     Assessment & Plan:  Acute pulmonary embolism, provoked, with submassive features Community-acquired pneumonia sPesi score 3 = high risk, pt with hypoxemia, tachycardia, presyncope, troponin leak, R heart strain - Initially treated with half dose alteplase  50mg  for thrombolysis then heparin  drip, continue today - Consider DOAC transition after 2-3 days heparin  (started evening  1/14) - Treat CAP - Azithromycin  and Ceftriaxone , today day 2 of Abx - Wean oxygen as tolerated - anticipate 3-6 Months anticoagulation at discharge - F/U echo  DVT  L femoral and popliteal DVT per US  on 1/14. - Anticoagulation per above  Iron Deficient Anemia Iron deficit of 1496mg  with current Hg 8.6. Recent iron studies in October 2024 confirm deficiency. Etiology is  heavy menstrual bleeding. - Consider IV iron tomorrow  Best Practice (right click and "Reselect all SmartList Selections" daily)   Diet/type: clear liquids DVT prophylaxis systemic heparin  Pressure ulcer(s): N/A GI prophylaxis: N/A Lines: Peripheral IV R antecubital, forearm Foley:  External urinary catheter Code Status:  full code Last date of multidisciplinary goals of care discussion [n/a]  Labs   CBC: Recent Labs  Lab 07/06/23 0530 07/07/23 0234  WBC 8.9 10.4  NEUTROABS 6.8  --   HGB 9.2* 8.6*  HCT 29.9* 28.5*  MCV 71.2* 69.0*  PLT 313 274    Basic Metabolic Panel: Recent Labs  Lab 07/06/23 0530 07/07/23 0234  NA 142 142  K 3.5 3.6  CL 103 105  CO2 28 28  GLUCOSE 100* 103*  BUN 9 9  CREATININE 0.74 0.63  CALCIUM 9.1 8.5*  MG  --  2.4  PHOS  --  5.2*   GFR: Estimated Creatinine Clearance: 133.1 mL/min (by C-G formula based on SCr of 0.63 mg/dL). Recent Labs  Lab 07/06/23 0530 07/06/23 0535 07/06/23 0825 07/06/23 2219 07/06/23 2353 07/07/23 0234  PROCALCITON  --   --   --  0.18  --   --   WBC 8.9  --   --   --   --  10.4  LATICACIDVEN  --  0.8 0.8  --  0.6  --     Liver Function Tests: No results for input(s): "AST", "ALT", "ALKPHOS", "BILITOT", "PROT", "ALBUMIN" in the last 168 hours. No results for input(s): "LIPASE", "AMYLASE" in the last 168 hours. No results for input(s): "AMMONIA" in the last 168 hours.  ABG    Component Value Date/Time   TCO2 27 04/06/2023 1757     Coagulation Profile: No results for input(s): "INR", "PROTIME" in the last 168 hours.  Cardiac Enzymes: No results for input(s): "CKTOTAL", "CKMB", "CKMBINDEX", "TROPONINI" in the last 168 hours.  HbA1C: No results found for: "HGBA1C"  CBG: Recent Labs  Lab 07/06/23 2333 07/07/23 0410  GLUCAP 149* 89    Review of Systems:   As above. Denies chest pain, back pain, dyspnea, cough, fever. Ascribes to bilateral leg pain worse on LLE.  Past Medical History:   She,  has a past medical history of Allergy, Asthma, Eczema, Essential hypertension, and Obesity, Class III, BMI 40-49.9 (morbid obesity) (HCC) (11/18/2020).   Surgical History:   Past Surgical History:  Procedure Laterality Date   INCISION AND DRAINAGE ABSCESS Left 02/04/2013   Procedure: INCISION AND DRAINAGE POSTERIOR NECK ABSCESS;  Surgeon: Lenton Rail, MD;  Location: [facility] OR;  Service: ENT;  Laterality: Left;   MOUTH SURGERY     Wisdom Teeth Extraction     Social History:   reports that she has never smoked. She has never used smokeless tobacco. She reports that she does not drink alcohol and does not use drugs.   Family History:  Her family history includes Asthma in her mother; Eczema in her mother; Hypertension in her father and mother.   Allergies Allergies  Allergen Reactions   Fish-Derived Products Shortness Of Breath, Swelling and Other (See Comments)  Lips swell   Shellfish-Derived Products Shortness Of Breath, Swelling and Other (See Comments)    Lips swell   Tree Extract Other (See Comments)    Patient was told she is allergic to trees     Home Medications  Prior to Admission medications   Medication Sig Start Date End Date Taking? Authorizing Provider  acetaminophen  (TYLENOL ) 500 MG tablet Take 1,000 mg by mouth every 6 (six) hours as needed for mild pain (pain score 1-3) or moderate pain (pain score 4-6).   Yes [provider]  albuterol  (VENTOLIN  HFA) 108 (90 Base) MCG/ACT inhaler Inhale 2 puffs into the lungs every 4 (four) hours as needed for wheezing or shortness of breath. 06/29/23  Yes Buena Carmine, NP  amoxicillin  (AMOXIL ) 875 MG tablet Take 1 tablet (875 mg total) by mouth 2 (two) times daily for 10 days. 06/29/23 07/09/23 Yes Buena Carmine, NP  EPINEPHrine  0.3 mg/0.3 mL IJ SOAJ injection Inject 0.3 mg into the muscle See admin instructions. Patient taking differently: Inject 0.3 mg into the muscle as needed for anaphylaxis. 04/08/23  Yes  Ghimire, Letty Raya, MD  FEROSUL 325 (65 Fe) MG tablet Take 325 mg by mouth daily. 12/14/22  Yes [provider]  hydrochlorothiazide  (HYDRODIURIL ) 25 MG tablet Take 25 mg by mouth daily. 03/31/23  Yes [provider]  hydrOXYzine (ATARAX) 25 MG tablet Take 25 mg by mouth as needed for anxiety. 03/31/23  Yes [provider]  ondansetron  (ZOFRAN -ODT) 4 MG disintegrating tablet Take 1 tablet (4 mg total) by mouth every 8 (eight) hours as needed. 04/02/23  Yes Vernestine Gondola, PA-C  predniSONE  (DELTASONE ) 20 MG tablet Take 2 tablets (40 mg total) by mouth daily with breakfast. 06/29/23  Yes Buena Carmine, NP  promethazine -dextromethorphan  (PROMETHAZINE -DM) 6.25-15 MG/5ML syrup Take 5 mLs by mouth 4 (four) times daily as needed for cough. 06/29/23  Yes Buena Carmine, NP  triamcinolone  cream (KENALOG ) 0.5 % Apply 1 Application topically 2 (two) times daily. 04/15/23  Yes [provider]  norethindrone (AYGESTIN) 5 MG tablet Take 5 mg by mouth 2 (two) times daily. Patient not taking: Reported on 07/06/2023 12/09/22   [provider]     Critical care time: 

## 2023-07-07 NOTE — Progress Notes (Signed)
 PHARMACY - ANTICOAGULATION CONSULT NOTE  Pharmacy Consult for Heparin  Indication: pulmonary embolus  Allergies  Allergen Reactions   Fish-Derived Products Shortness Of Breath, Swelling and Other (See Comments)    Lips swell   Shellfish-Derived Products Shortness Of Breath, Swelling and Other (See Comments)    Lips swell   Tree Extract Other (See Comments)    Patient was told she is allergic to trees    Patient Measurements: Height: 5' 2.5" (158.8 cm) Weight: 122.5 kg (270 lb 1 oz) IBW/kg (Calculated) : 51.25 Heparin  Dosing Weight: 81.6kg  Vital Signs: Temp: 99 F (37.2 C) (01/15 1534) Temp Source: Axillary (01/15 1534) BP: 117/69 (01/15 1700) Pulse Rate: 121 (01/15 1700)  Labs: Recent Labs    07/06/23 0530 07/06/23 1500 07/06/23 1620 07/06/23 2219 07/07/23 0234 07/07/23 0758 07/07/23 1636  HGB 9.2*  --   --   --  8.6*  --   --   HCT 29.9*  --   --   --  28.5*  --   --   PLT 313  --   --   --  274  --   --   APTT  --   --   --  59*  --   --   --   HEPARINUNFRC  --   --   --  <0.10*  --  0.14* 0.33  CREATININE 0.74  --   --   --  0.63  --   --   TROPONINIHS  --  75* 243*  --   --   --   --     Estimated Creatinine Clearance: 133.1 mL/min (by C-G formula based on SCr of 0.63 mg/dL).   Medical History: Past Medical History:  Diagnosis Date   Allergy    eczema, seasonal allergies   Asthma    Eczema    Essential hypertension    Obesity, Class III, BMI 40-49.9 (morbid obesity) (HCC) 11/18/2020   Medications:  Scheduled:   Chlorhexidine  Gluconate Cloth  6 each Topical Daily   ipratropium-albuterol   3 mL Nebulization Q6H   methylPREDNISolone  (SOLU-MEDROL ) injection  125 mg Intravenous Once   Infusions:   azithromycin  Stopped (07/07/23 0113)   cefTRIAXone  (ROCEPHIN )  IV     heparin  1,800 Units/hr (07/07/23 1700)   Assessment: 27YOF presenting with R leg pain who became SOB. CTA found to be positive for acute saddle PE with evidence of R heart strain. Not  on [facility] prior to admission. Pharmacy has been consulted to dose heparin . Prior to heparin  gtt initiation decision to start tPA 50mg  over 2h (given 1/14 2042), now s/p infusion and to start heparin  gtt with aPTT <80.  Heparin  level therapeutic (0.33) on infusion at 1800 units/hr. No issues with line or bleeding reported per RN.  Goal of Therapy:  Heparin  level 0.3-0.7 units/ml (0.3-0.5 units/ml x 24h post tPA infusion so until ~2200 1/15) Monitor platelets by anticoagulation protocol: Yes   Plan:  Continue heparin  gtt at 1800 units/hr F/u 6 hour confirmatory heparin  level  Enrigue Harvard, PharmD, BCPS Please see amion for complete clinical pharmacist phone list 07/07/2023 5:29 PM

## 2023-07-07 NOTE — Progress Notes (Addendum)
 PHARMACY - ANTICOAGULATION CONSULT NOTE  Pharmacy Consult for Heparin  Indication: pulmonary embolus  Allergies  Allergen Reactions   Fish-Derived Products Shortness Of Breath, Swelling and Other (See Comments)    Lips swell   Shellfish-Derived Products Shortness Of Breath, Swelling and Other (See Comments)    Lips swell   Tree Extract Other (See Comments)    Patient was told she is allergic to trees    Patient Measurements: Height: 5' 2.5" (158.8 cm) Weight: 122.5 kg (270 lb 1 oz) IBW/kg (Calculated) : 51.25 Heparin  Dosing Weight: 81.6kg  Vital Signs: Temp: 98 F (36.7 C) (01/15 0807) Temp Source: Axillary (01/15 0807) BP: 120/81 (01/15 0900) Pulse Rate: 122 (01/15 0900)  Labs: Recent Labs    07/06/23 0530 07/06/23 1500 07/06/23 1620 07/06/23 2219 07/07/23 0234 07/07/23 0758  HGB 9.2*  --   --   --  8.6*  --   HCT 29.9*  --   --   --  28.5*  --   PLT 313  --   --   --  274  --   APTT  --   --   --  59*  --   --   HEPARINUNFRC  --   --   --  <0.10*  --  0.14*  CREATININE 0.74  --   --   --  0.63  --   TROPONINIHS  --  75* 243*  --   --   --     Estimated Creatinine Clearance: 133.1 mL/min (by C-G formula based on SCr of 0.63 mg/dL).   Medical History: Past Medical History:  Diagnosis Date   Allergy    eczema, seasonal allergies   Asthma    Eczema    Essential hypertension    Obesity, Class III, BMI 40-49.9 (morbid obesity) (HCC) 11/18/2020   Medications:  Scheduled:   Chlorhexidine  Gluconate Cloth  6 each Topical Daily   ipratropium-albuterol   3 mL Nebulization Q6H   methylPREDNISolone  (SOLU-MEDROL ) injection  125 mg Intravenous Once   Infusions:   azithromycin  Stopped (07/07/23 0113)   cefTRIAXone  (ROCEPHIN )  IV     heparin  1,400 Units/hr (07/07/23 0900)   Assessment: 27YOF presenting with R leg pain who became SOB. CTA found to be positive for acute saddle PE with evidence of R heart strain. Not on [facility] prior to admission. Pharmacy has been  consulted to dose heparin . Prior to heparin  gtt initiation decision to start tPA 50mg  over 2h, now s/p infusion and to start heparin  gtt with aPTT <80.  1/15 AM: heparin  level 0.14, subtherapeutic on 1400 units/hr. No issues with infusion running or signs of bleeding per RN. Hgb stable low at 8.6, PLT 274. Discussed heparin  level with MD, due to subtherapeutic level and no signs of bleeding will administer a low dose bolus.  Goal of Therapy:  Heparin  level 0.3-0.7 units/ml Monitor platelets by anticoagulation protocol: Yes   Plan:  Heparin  1000 units x 1 - low bolus dose approved by MD Increase heparin  gtt to 1800 units/hr F/u 6 hour heparin  level Monitor heparin  level, CBC, and s/sx of bleeding daily  Volney Grumbles, PharmD PGY-1 Acute Care Pharmacy Resident 07/07/2023 9:36 AM

## 2023-07-07 NOTE — Progress Notes (Signed)
 PHARMACY - ANTICOAGULATION CONSULT NOTE  Pharmacy Consult for Heparin  Indication: pulmonary embolus  Allergies  Allergen Reactions   Fish-Derived Products Shortness Of Breath, Swelling and Other (See Comments)    Lips swell   Shellfish-Derived Products Shortness Of Breath, Swelling and Other (See Comments)    Lips swell   Tree Extract Other (See Comments)    Patient was told she is allergic to trees    Patient Measurements: Height: 5' 2.5" (158.8 cm) Weight: 122.5 kg (270 lb 1 oz) IBW/kg (Calculated) : 51.25 Heparin  Dosing Weight: 81.6kg  Vital Signs: Temp: 99.6 F (37.6 C) (01/15 2344) Temp Source: Oral (01/15 2344) BP: 114/82 (01/15 1800) Pulse Rate: 123 (01/15 1800)  Labs: Recent Labs    07/06/23 0530 07/06/23 1500 07/06/23 1620 07/06/23 2219 07/06/23 2219 07/07/23 0234 07/07/23 0758 07/07/23 1636 07/07/23 2330  HGB 9.2*  --   --   --   --  8.6*  --   --   --   HCT 29.9*  --   --   --   --  28.5*  --   --   --   PLT 313  --   --   --   --  274  --   --   --   APTT  --   --   --  59*  --   --   --   --   --   HEPARINUNFRC  --   --   --  <0.10*   < >  --  0.14* 0.33 0.42  CREATININE 0.74  --   --   --   --  0.63  --   --   --   TROPONINIHS  --  75* 243*  --   --   --   --   --   --    < > = values in this interval not displayed.    Estimated Creatinine Clearance: 133.1 mL/min (by C-G formula based on SCr of 0.63 mg/dL).  Assessment: 27YOF presenting with R leg pain who became SOB. CTA found to be positive for acute saddle PE with evidence of R heart strain. Not on [facility] prior to admission. Pharmacy has been consulted to dose heparin . Prior to heparin  gtt initiation decision to start tPA 50mg  over 2h (given 1/14 2042), now s/p infusion and to start heparin  gtt with aPTT <80.  Heparin  level therapeutic (0.42) on infusion at 1800 units/hr. No issues with line or bleeding reported. Heparin  goal back to 0.3-0.7 given 24h post tPA infusion  Goal of Therapy:  Heparin   level 0.3-0.7 units/ml  Monitor platelets by anticoagulation protocol: Yes   Plan:  Continue heparin  gtt at 1800 units/hr F/u daily heparin  level and CBC  Young Hensen, PharmD. Clinical Pharmacist 07/07/2023 11:51 PM

## 2023-07-07 NOTE — Plan of Care (Signed)
  Problem: Education: Goal: Knowledge of General Education information will improve Description: Including pain rating scale, medication(s)/side effects and non-pharmacologic comfort measures Outcome: Progressing   Problem: Clinical Measurements: Goal: Respiratory complications will improve Outcome: Progressing Goal: Cardiovascular complication will be avoided Outcome: Progressing   Problem: Nutrition: Goal: Adequate nutrition will be maintained Outcome: Progressing   Problem: Pain Management: Goal: General experience of comfort will improve Outcome: Progressing

## 2023-07-08 ENCOUNTER — Telehealth: Payer: Self-pay | Admitting: Pulmonary Disease

## 2023-07-08 ENCOUNTER — Encounter (HOSPITAL_COMMUNITY): Payer: Self-pay | Admitting: Internal Medicine

## 2023-07-08 DIAGNOSIS — I2609 Other pulmonary embolism with acute cor pulmonale: Secondary | ICD-10-CM

## 2023-07-08 LAB — CBC
HCT: 27.5 % — ABNORMAL LOW (ref 36.0–46.0)
Hemoglobin: 9.5 g/dL — ABNORMAL LOW (ref 12.0–15.0)
MCH: 26 pg (ref 26.0–34.0)
MCHC: 34.5 g/dL (ref 30.0–36.0)
MCV: 75.1 fL — ABNORMAL LOW (ref 80.0–100.0)
Platelets: 305 10*3/uL (ref 150–400)
RBC: 3.66 MIL/uL — ABNORMAL LOW (ref 3.87–5.11)
RDW: 23.5 % — ABNORMAL HIGH (ref 11.5–15.5)
WBC: 7.9 10*3/uL (ref 4.0–10.5)
nRBC: 0.4 % — ABNORMAL HIGH (ref 0.0–0.2)

## 2023-07-08 LAB — BASIC METABOLIC PANEL
Anion gap: 9 (ref 5–15)
BUN: 7 mg/dL (ref 6–20)
CO2: 27 mmol/L (ref 22–32)
Calcium: 8.5 mg/dL — ABNORMAL LOW (ref 8.9–10.3)
Chloride: 104 mmol/L (ref 98–111)
Creatinine, Ser: 0.72 mg/dL (ref 0.44–1.00)
GFR, Estimated: >60 mL/min (ref >60)
Glucose, Bld: 93 mg/dL (ref 70–99)
Potassium: 3.7 mmol/L (ref 3.5–5.1)
Sodium: 140 mmol/L (ref 135–145)

## 2023-07-08 LAB — HEPARIN LEVEL (UNFRACTIONATED)
Heparin Unfractionated: 0.29 [IU]/mL — ABNORMAL LOW (ref 0.30–0.70)
Heparin Unfractionated: 0.29 [IU]/mL — ABNORMAL LOW (ref 0.30–0.70)

## 2023-07-08 MED ORDER — GUAIFENESIN-CODEINE 100-10 MG/5ML PO SOLN
5.0000 mL | ORAL | Status: DC | PRN
  Administered 2023-07-08 (×3): 5 mL via ORAL
  Filled 2023-07-08 (×5): qty 5

## 2023-07-08 MED ORDER — SODIUM CHLORIDE 0.9 % IV SOLN
2.0000 g | INTRAVENOUS | Status: AC
  Administered 2023-07-08 – 2023-07-10 (×3): 2 g via INTRAVENOUS
  Filled 2023-07-08 (×3): qty 20

## 2023-07-08 MED ORDER — FERROUS SULFATE 325 (65 FE) MG PO TABS
325.0000 mg | ORAL_TABLET | Freq: Every day | ORAL | Status: DC
Start: 2023-07-08 — End: 2023-07-11
  Administered 2023-07-08 – 2023-07-11 (×3): 325 mg via ORAL
  Filled 2023-07-08 (×5): qty 1

## 2023-07-08 MED ORDER — DOXYCYCLINE HYCLATE 100 MG PO TABS
100.0000 mg | ORAL_TABLET | Freq: Two times a day (BID) | ORAL | Status: AC
  Administered 2023-07-08 – 2023-07-10 (×4): 100 mg via ORAL
  Filled 2023-07-08 (×5): qty 1

## 2023-07-08 MED ORDER — DM-GUAIFENESIN ER 30-600 MG PO TB12
1.0000 | ORAL_TABLET | Freq: Two times a day (BID) | ORAL | Status: DC
  Administered 2023-07-08: 1 via ORAL
  Filled 2023-07-08 (×2): qty 1

## 2023-07-08 MED ORDER — HEPARIN BOLUS VIA INFUSION
1000.0000 [IU] | Freq: Once | INTRAVENOUS | Status: AC
  Administered 2023-07-08: 1000 [IU] via INTRAVENOUS
  Filled 2023-07-08: qty 1000

## 2023-07-08 NOTE — Plan of Care (Signed)
  Problem: Education: Goal: Knowledge of General Education information will improve Description: Including pain rating scale, medication(s)/side effects and non-pharmacologic comfort measures Outcome: Progressing   Problem: Health Behavior/Discharge Planning: Goal: Ability to manage health-related needs will improve Outcome: Progressing   Problem: Clinical Measurements: Goal: Ability to maintain clinical measurements within normal limits will improve Outcome: Progressing Goal: Diagnostic test results will improve Outcome: Progressing Goal: Respiratory complications will improve Outcome: Progressing   Problem: Nutrition: Goal: Adequate nutrition will be maintained Outcome: Progressing   Problem: Safety: Goal: Ability to remain free from injury will improve Outcome: Progressing

## 2023-07-08 NOTE — Progress Notes (Signed)
Heart Failure Nurse Navigator Progress Note  PCP: Kathreen Cornfield, PA-C PCP-Cardiologist: None Admission Diagnosis: Acute pulmonary embolism with community- acquired pneumonia.  Admitted from: Home  Presentation:   Amber Arroyo presented with bilateral foot swelling and right leg pain x 1 week. Labored breathing after walking to bathroom with oxygen sats at 72 % on room air, heart rate 140. BP 143/98, HR 136, BMI 48.61, CT PE study with acute pulmonary embolism with saddle distrubution and right heart strain, large consolidation in the superior sub segment of the right lower lobe. [place] on 1/14 showed left femoral and popliteal DVT. ECHO on 07/07/23 with LVEF 35-40%.   Patient was educated on the sign and symptoms of heart failure, daily weights, when ot call her doctor or go to the ED, Diet/ fluid restrictions, patient reported to some salt use and drinking of soda. Continued education on taking all medications as prescribed and attending all medical appointments, patient verbalized her understanding, a HF TOC appointment was scheduled for 07/19/2023 @ 10 am.   ECHO/ LVEF: 35-40% New  Clinical Course:  Past Medical History:  Diagnosis Date   Allergy    eczema, seasonal allergies   Asthma    Eczema    Essential hypertension    Obesity, Class III, BMI 40-49.9 (morbid obesity) (HCC) 11/18/2020     Social History   Socioeconomic History   Marital status: Single    Spouse name: Not on file   Number of children: Not on file   Years of education: Not on file   Highest education level: Not on file  Occupational History   Not on file  Tobacco Use   Smoking status: Never   Smokeless tobacco: Never  Vaping Use   Vaping status: Never Used  Substance and Sexual Activity   Alcohol use: No   Drug use: No   Sexual activity: Yes    Birth control/protection: Condom, None  Other Topics Concern   Not on file  Social History Narrative   Lives with boyfriend.  No pets.   Social Drivers of Manufacturing engineer Strain: Not on file  Food Insecurity: No Food Insecurity (04/06/2023)   Hunger Vital Sign    Worried About Running Out of Food in the Last Year: Never true    Ran Out of Food in the Last Year: Never true  Transportation Needs: No Transportation Needs (04/06/2023)   PRAPARE - Administrator, Civil Service (Medical): No    Lack of Transportation (Non-Medical): No  Physical Activity: Not on file  Stress: Not on file  Social Connections: Not on file   Education Assessment and Provision:  Detailed education and instructions provided on heart failure disease management including the following:  Signs and symptoms of Heart Failure When to call the physician Importance of daily weights Low sodium diet Fluid restriction Medication management Anticipated future follow-up appointments  Patient education given on each of the above topics.  Patient acknowledges understanding via teach back method and acceptance of all instructions.  Education Materials:  "Living Better With Heart Failure" Booklet, HF zone tool, & Daily Weight Tracker Tool.  Patient has scale at home: Yes Patient has pill box at home: NA    High Risk Criteria for Readmission and/or Poor Patient Outcomes: Heart failure hospital admissions (last 6 months): 0  No Show rate: 21 %  Difficult social situation: No Demonstrates medication adherence: yes Primary Language: English Literacy level: Reading, writing, and comprehension.  Barriers of Care:  New HF education Diet/ fluid restrictions ( salt/ soda) Daily weights  Considerations/Referrals:   Referral made to Heart Failure Pharmacist Stewardship: Yes Referral made to Heart Failure CSW/NCM TOC: No Referral made to Heart & Vascular TOC clinic: Yes, 07/19/2023 @ 10 am.   Items for Follow-up on DC/TOC: Continued HF education Diet/ fluid restrictions ( salt/ soda) Daily weights   Rhae Hammock, BSN, RN Heart Failure Event organiser Only

## 2023-07-08 NOTE — Progress Notes (Signed)
   Heart Failure Stewardship Pharmacist Progress Note   PCP: Kathreen Cornfield, PA-C PCP-Cardiologist: None    HPI:  28 yo F with PMH asthma, hypertension, obesity. Patient presented to ED via EMS after almost passing out when standing up at home. Patient reported increased SOB, productive cough, and chest congestion over the past couple weeks. Was seen in urgent care and treated for URI, COVID and flu negative. For the past week reports she has been bed bound and about 2 weeks ago she had leg swelling followed by worsening SOB. Presented to ED found to be hypoxemic and tachycardic. CT PE study on 1/14 confirmed acute saddle PE with right heart strain and evidence of pneumonia. Vascular ultrasound on 1/14 also showed acute DVT involving the left femoral vein, and popliteal vein extending to the gastrocnemius vein. Patient was given half dose alteplase then started on heparin gtt with plan to transition to Eliquis. Also started on ceftriaxone and azithromycin for CAP. ECHO on 1/15 showed EF 35-40%, global hypokinesis, mild LVH, moderate RV dysfunction, trivial MR, dilated inferior vena cava.    Patient reports SOB and leg pain is improving. Trace LEE. Still has a cough. Patient reports she does have unprotected sex, not planning to become pregnant. Not on oral contraceptives and does not use barrier methods. Discussed that with the new medications she will be started on (Eliquis, HF GDMT) she will need to use contraceptive methods going forward. She expressed understanding and stated she would use barrier methods.   Current HF Medications: None  Prior to admission HF Medications: None  Pertinent Lab Values: Serum creatinine 0.72, BUN 7, Potassium 3.7, Sodium 140, BNP 56.7, Magnesium 2.4, A1c not checked  Vital Signs: Admission weight: 270 lbs Blood pressure: 110s-160/80-90s  Heart rate: 100-120s   Medication Assistance / Insurance Benefits Check: Does the patient have prescription insurance?   Yes Type of insurance plan: C[employer]  Outpatient Pharmacy:  Prior to admission [facility] Is the patient willing to use [facility]OC pharmacy at discharge? Yes Is the patient willing to transition their outpatient pharmacy to utilize a [facility] outpatient pharmacy?   No    Assessment: 1. 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 NYHA class II symptoms. - Recommend to add losartan 25 mg daily for BP control and HF benefit - Recommend to add metoprolol XL 25 mg daily for HF GDMT and tachycardia (HR 110-120s today) - Given etiology of HF likely secondary to acute PE, will hold off on initiation of SGLT-2 and spironolactone. - Recommend to monitor weight daily. Trace LEE present. - Patient with lower health literacy, would recommend continued education on need for contraception with initiation of new medications.   Plan: 1) Medication changes recommended at this time: - Add losartan 25 mg daily - Add metoprolol XL 25 mg daily  2) Patient assistance: - Patient has Comptroller  3)  Education  - Initial education completed, full education to be completed prior to discharge.    Jarrett Ables, PharmD PGY-1 Pharmacy Resident   Sharen Hones, PharmD, BCPS Heart Failure Stewardship Pharmacist Phone [phone]

## 2023-07-08 NOTE — Progress Notes (Signed)
NAME:  Amber Arroyo, MRN:  [id number], DOB:  [birth year], LOS: 2 ADMISSION DATE:  07/06/2023, CONSULTATION DATE:  07/06/2023 REFERRING MD:  EDP, CHIEF COMPLAINT:  shortness of breath  History of Present Illness:  Amber Arroyo is a 28 year old woman with past medical history of asthma on as needed albuterol who presents with shortness of breath.     Her symptoms started about 10 days ago with chest congestion, cough productive with sputum, shortness of breath.  She was seen in urgent care and diagnosed with URI COVID and flu testing was negative.  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  She notes for the past week she has been essentially bedbound and only getting up to go to the bathroom.  She denies any prolonged plane rides or car trips.  Her URI symptoms started about 10 days ago; however, she notes 2 days ago she had a leg swelling and worsening shortness of breath.  Today she tried to stand up and almost passed on the bathroom which is why she called 911.  She was given an albuterol inhaler in the urgent care but this did not improve her symptoms.  On presentation to the ED she was found to be hypoxemic, tachycardic.  CT PE study confirmed acute pulmonary embolism with saddle distribution and right heart strain.  She also has a questionable wedge-shaped infarct versus pneumonia in the right upper lobe.  Troponin was elevated.  PCCM was consulted to evaluate for management of acute pulmonary embolism with submassive features.   She is not on OCPs.  She was on a progesterone only birth control pill to help regulate her periods but this was last taken in August 2024.  There is no family or personal medical history of hypercoagulable disorder or venous thromboembolism.  She denies any history of intracranial hemorrhage or recent surgery.  Pertinent  Medical History  Asthma   Social history: Non smoker Works at daycare, works at [employer]: Including  procedures, antibiotic start and stop dates in addition to other pertinent events   07/06/23: Admission to ICU. Start of CAP ABX.   Interim History / Subjective:  Feels well this morning, slept well last night. Complains of a persistent cough. Intermittent b/l squeezing leg pain. No fevers, chills, chest pain, palpitations.  Objective   Blood pressure (!) 139/93, pulse (!) 101, temperature 98.7 F (37.1 C), temperature source Oral, resp. rate (!) 27, height 5' 2.5" (1.588 m), weight 122.5 kg, last menstrual period 06/23/2023, SpO2 95%.    FiO2 (%):  [40 %] 40 %   Intake/Output Summary (Last 24 hours) at 07/08/2023 0644 Last data filed at 07/08/2023 0400 Gross per 24 hour  Intake 1012.21 ml  Output 900 ml  Net 112.21 ml   Filed Weights   07/06/23 1700  Weight: 122.5 kg    Examination: General: Well-appearing, asleep and easily awakable HENT: Moist membranes, wearing venturi mask Lungs: CTABL Cardiovascular: tachycardic with regular rhythm no MRG Abdomen: Soft, non-distended, normal bowel sounds Extremities: warm. Bilateral calf tenderness with no asymmetric swelling or erythema. Trace edema. Neuro: AOx3 GU: deferred  Resolved Hospital Problem list     Assessment & Plan:  Acute pulmonary embolism, provoked, with submassive features Community-acquired pneumonia sPesi score 3 = high risk, pt with hypoxemia, tachycardia, presyncope, troponin leak, R heart strain - Received half dose alteplase 50mg  on 1/14 and heparin drip, continuing heparin - Heparin started evening of 1/14, consider DOAC transition soon, possibly tomorrow -  Treat CAP - Doxycycline and Ceftriaxone, today day 3/5 of Abx. Azith -> doxy 1/16 due to long Qt. - Wean oxygen as tolerated - anticipate 3-6 Months anticoagulation at discharge - Continue to observe this morning, should be stable to leave ICU today   DVT  L femoral and popliteal DVT per [place] on 1/14. - Anticoagulation per above   Iron Deficient  Anemia Iron deficit of 1496mg  with current Hg 8.6. Recent iron studies in October 2024 confirm deficiency. Etiology is heavy menstrual bleeding. - Recheck ferritin today, consider IV iron later in hospitalization   RV dysfunction - Plan 89-month TTE as outpatient, if persistent consider workup for CTEPH. She will see pulm OP.  Best Practice (right click and "Reselect all SmartList Selections" daily)   Diet/type: Regular consistency (see orders) DVT prophylaxis systemic heparin Pressure ulcer(s): N/A GI prophylaxis: N/A Lines: Peripheral IV R antecubital, forearm  Foley:  External urinary catheter  Code Status:  full code Last date of multidisciplinary goals of care discussion [n/a]  Labs   CBC: Recent Labs  Lab 07/06/23 0530 07/07/23 0234 07/07/23 2330  WBC 8.9 10.4 7.9  NEUTROABS 6.8  --   --   HGB 9.2* 8.6* 9.5*  HCT 29.9* 28.5* 27.5*  MCV 71.2* 69.0* 75.1*  PLT 313 274 305    Basic Metabolic Panel: Recent Labs  Lab 07/06/23 0530 07/07/23 0234  NA 142 142  K 3.5 3.6  CL 103 105  CO2 28 28  GLUCOSE 100* 103*  BUN 9 9  CREATININE 0.74 0.63  CALCIUM 9.1 8.5*  MG  --  2.4  PHOS  --  5.2*   GFR: Estimated Creatinine Clearance: 133.1 mL/min (by C-G formula based on SCr of 0.63 mg/dL). Recent Labs  Lab 07/06/23 0530 07/06/23 0535 07/06/23 0825 07/06/23 2219 07/06/23 2353 07/07/23 0234 07/07/23 2330  PROCALCITON  --   --   --  0.18  --   --   --   WBC 8.9  --   --   --   --  10.4 7.9  LATICACIDVEN  --  0.8 0.8  --  0.6  --   --     Liver Function Tests: No results for input(s): "AST", "ALT", "ALKPHOS", "BILITOT", "PROT", "ALBUMIN" in the last 168 hours. No results for input(s): "LIPASE", "AMYLASE" in the last 168 hours. No results for input(s): "AMMONIA" in the last 168 hours.  ABG    Component Value Date/Time   TCO2 27 04/06/2023 1757     Coagulation Profile: No results for input(s): "INR", "PROTIME" in the last 168 hours.  Cardiac Enzymes: No  results for input(s): "CKTOTAL", "CKMB", "CKMBINDEX", "TROPONINI" in the last 168 hours.  HbA1C: No results found for: "HGBA1C"  CBG: Recent Labs  Lab 07/06/23 2021 07/06/23 2333 07/07/23 0410 07/07/23 0809  GLUCAP 88 149* 89 112*    Review of Systems:   + cough and leg pain. Otherwise - cardiac, neuro, GI, GU.  Past Medical History:  She,  has a past medical history of Allergy, Asthma, Eczema, Essential hypertension, and Obesity, Class III, BMI 40-49.9 (morbid obesity) (HCC) (11/18/2020).   Surgical History:   Past Surgical History:  Procedure Laterality Date   INCISION AND DRAINAGE ABSCESS Left 02/04/2013   Procedure: INCISION AND DRAINAGE POSTERIOR NECK ABSCESS;  Surgeon: Flo Shanks, MD;  [facility]: [facility];  Service: ENT;  Laterality: Left;   MOUTH SURGERY     Wisdom Teeth Extraction     Social History:   reports  that she has never smoked. She has never used smokeless tobacco. She reports that she does not drink alcohol and does not use drugs.   Family History:  Her family history includes Asthma in her mother; Eczema in her mother; Hypertension in her father and mother.   Allergies Allergies  Allergen Reactions   Fish-Derived Products Shortness Of Breath, Swelling and Other (See Comments)    Lips swell   Shellfish-Derived Products Shortness Of Breath, Swelling and Other (See Comments)    Lips swell   Tree Extract Other (See Comments)    Patient was told she is allergic to trees     Home Medications  Prior to Admission medications   Medication Sig Start Date End Date Taking? Authorizing Provider  acetaminophen (TYLENOL) 500 MG tablet Take 1,000 mg by mouth every 6 (six) hours as needed for mild pain (pain score 1-3) or moderate pain (pain score 4-6).   Yes [provider]  albuterol (VENTOLIN HFA) 108 (90 Base) MCG/ACT inhaler Inhale 2 puffs into the lungs every 4 (four) hours as needed for wheezing or shortness of breath. 06/29/23  Yes Bing Neighbors,  NP  amoxicillin (AMOXIL) 875 MG tablet Take 1 tablet (875 mg total) by mouth 2 (two) times daily for 10 days. 06/29/23 07/09/23 Yes Bing Neighbors, NP  EPINEPHrine 0.3 mg/0.3 mL IJ SOAJ injection Inject 0.3 mg into the muscle See admin instructions. Patient taking differently: Inject 0.3 mg into the muscle as needed for anaphylaxis. 04/08/23  Yes Ghimire, Lyndel Safe, MD  FEROSUL 325 (65 Fe) MG tablet Take 325 mg by mouth daily. 12/14/22  Yes [provider]  hydrochlorothiazide (HYDRODIURIL) 25 MG tablet Take 25 mg by mouth daily. 03/31/23  Yes [provider]  hydrOXYzine (ATARAX) 25 MG tablet Take 25 mg by mouth as needed for anxiety. 03/31/23  Yes [provider]  ondansetron (ZOFRAN-ODT) 4 MG disintegrating tablet Take 1 tablet (4 mg total) by mouth every 8 (eight) hours as needed. 04/02/23  Yes Tomi Bamberger, PA-C  predniSONE (DELTASONE) 20 MG tablet Take 2 tablets (40 mg total) by mouth daily with breakfast. 06/29/23  Yes Bing Neighbors, NP  promethazine-dextromethorphan (PROMETHAZINE-DM) 6.25-15 MG/5ML syrup Take 5 mLs by mouth 4 (four) times daily as needed for cough. 06/29/23  Yes Bing Neighbors, NP  triamcinolone cream (KENALOG) 0.5 % Apply 1 Application topically 2 (two) times daily. 04/15/23  Yes [provider]  norethindrone (AYGESTIN) 5 MG tablet Take 5 mg by mouth 2 (two) times daily. Patient not taking: Reported on 07/06/2023 12/09/22   [provider]     Critical care time: 35 mins

## 2023-07-08 NOTE — Evaluation (Signed)
Physical Therapy Evaluation Patient Details Name: Amber Arroyo MRN: [id number] DOB: 10/24/1995 Today's Date: 07/08/2023  History of Present Illness  Amber Arroyo is a 28 year old woman who presents 1/14 with shortness of breath.   CT PE study confirmed acute pulmonary embolism with saddle distribution and right heart strain. Questionable wedge-shaped infarct versus pneumonia in the right upper lobe. PMHx: asthma on as needed albuterol.   Clinical Impression  Pt admitted with above diagnosis. PTA she was independent, working 2 jobs, does not drive. Reports being essentially bed bound with sickness for about a week prior to admission. She required up to min assist, mostly mobilizing with CGA using a walker for support today. Very easily fatigued with moderate dyspnea. Required 2 standing rest breaks to ambulate 36 feet with RW on 8L supplemental O2 via HFNC, sats 90%, HR to low 140s. Recovers quickly upon sitting, SpO2 95% on 8L HFNC, HR back to 120s (which was pre-activity resting rate.) She feels very weak. May be worth having CIR follow during admission to see if she could benefit from short term CIR stay. She lives on the 3rd floor of an apartment with limited help available (boyfriend.) We will progress her acutely as tolerated but anticipate needing some short term post acute rehab depending on how long she is admitted in the main hospital. Educated extensively on getting OOB multiple times per day, HEP for LEs, and walking with staff as frequently as tolerated. Pt currently with functional limitations due to the deficits listed below (see PT Problem List). Pt will benefit from acute skilled PT to increase their independence and safety with mobility to allow discharge.           If plan is discharge home, recommend the following: A little help with walking and/or transfers;A little help with bathing/dressing/bathroom;Assistance with cooking/housework;Assist for transportation;Help with stairs or ramp for  entrance   Can travel by private vehicle        Equipment Recommendations Other (comment) (TBD)  Recommendations for Other Services  Rehab consult    Functional Status Assessment Patient has had a recent decline in their functional status and demonstrates the ability to make significant improvements in function in a reasonable and predictable amount of time.     Precautions / Restrictions Precautions Precautions: None Precaution Comments: monitor HR, O2 Restrictions Weight Bearing Restrictions Per Provider Order: No      Mobility  Bed Mobility               General bed mobility comments: sitting EOB    Transfers Overall transfer level: Needs assistance Equipment used: Rolling walker (2 wheels), None Transfers: Sit to/from Stand Sit to Stand: Min assist           General transfer comment: Min assist without device, pt weaker than she realized, provided RW which helped and performed at [facility] with this device. Good control with descent into recliner. Assisted with line/lead management.    Ambulation/Gait Ambulation/Gait assistance: Contact guard assist Gait Distance (Feet): 36 Feet Assistive device: Rolling walker (2 wheels) Gait Pattern/deviations: Step-through pattern, Decreased stride length, Wide base of support Gait velocity: decr Gait velocity interpretation: <1.31 ft/sec, indicative of household ambulator   General Gait Details: Slow and guarded, required 2 standing rest breaks to complete distance. Easily fatigued, 2/3 dyspnea. Educated on breathing techniques, energy conservation, pacing, and symptom awareness. RW for support to stabilize. On 8L HFNC with sats 90%, HR peaked at low 140s.  Stairs  Wheelchair Mobility     Tilt Bed    Modified Rankin (Stroke Patients Only)       Balance Overall balance assessment: Needs assistance Sitting-balance support: No upper extremity supported, Feet supported Sitting balance-Leahy Scale:  Good     Standing balance support: Single extremity supported Standing balance-Leahy Scale: Poor Standing balance comment: More stable with BIL UE support                             Pertinent Vitals/Pain Pain Assessment Pain Assessment: No/denies pain    Home Living Family/patient expects to be discharged to:: Private residence Living Arrangements: Other (Comment);Alone (Boyfriend stays with her some)   Type of Home: Apartment Home Access: Stairs to enter Entrance Stairs-Rails: Right;Left Entrance Stairs-Number of Steps: 3rd floor   Home Layout: One level Home Equipment: None      Prior Function Prior Level of Function : Independent/Modified Independent;Working/employed             Mobility Comments: ind ADLs Comments: ind     Extremity/Trunk Assessment   Upper Extremity Assessment Upper Extremity Assessment: Defer to OT evaluation    Lower Extremity Assessment Lower Extremity Assessment: Generalized weakness       Communication   Communication Communication: No apparent difficulties Cueing Techniques: Verbal cues  Cognition Arousal: Alert Behavior During Therapy: WFL for tasks assessed/performed Overall Cognitive Status: Within Functional Limits for tasks assessed                                          General Comments General comments (skin integrity, edema, etc.): Pre activity at rest: SpO2 93% on 8L HFNC, HR 129, BP140/89, RR 24. Post activity 95% SpO2 on 8L HFNC, HR 120s, RR 20.    Exercises General Exercises - Lower Extremity Ankle Circles/Pumps: AROM, Both, 10 reps, Seated Quad Sets: Strengthening, Both, 10 reps, Seated Gluteal Sets: Strengthening, Both, 10 reps, Seated Heel Slides: Strengthening, Both, 10 reps, Seated   Assessment/Plan    PT Assessment Patient needs continued PT services  PT Problem List Decreased strength;Decreased activity tolerance;Decreased balance;Decreased mobility;Decreased knowledge  of use of DME;Cardiopulmonary status limiting activity;Obesity       PT Treatment Interventions DME instruction;Gait training;Stair training;Functional mobility training;Therapeutic activities;Therapeutic exercise;Neuromuscular re-education;Balance training;Patient/family education    PT Goals (Current goals can be found in the Care Plan section)  Acute Rehab PT Goals Patient Stated Goal: Get well PT Goal Formulation: With patient Time For Goal Achievement: 07/22/23 Potential to Achieve Goals: Good    Frequency Min 1X/week     Co-evaluation               AM-PAC PT "6 Clicks" Mobility  Outcome Measure Help needed turning from your back to your side while in a flat bed without using bedrails?: None Help needed moving from lying on your back to sitting on the side of a flat bed without using bedrails?: A Little Help needed moving to and from a bed to a chair (including a wheelchair)?: A Little Help needed standing up from a chair using your arms (e.g., wheelchair or bedside chair)?: A Little Help needed to walk in hospital room?: A Little Help needed climbing 3-5 steps with a railing? : Total 6 Click Score: 17    End of Session Equipment Utilized During Treatment: Gait belt;Oxygen Activity Tolerance: Patient tolerated treatment well  Patient left: in chair;with call bell/phone within reach Nurse Communication:  (Vitals with gait - residents) PT Visit Diagnosis: Unsteadiness on feet (R26.81);Other abnormalities of gait and mobility (R26.89);Muscle weakness (generalized) (M62.81)    Time: 1336-1410 PT Time Calculation (min) (ACUTE ONLY): 34 min   Charges:   PT Evaluation $PT Eval Moderate Complexity: 1 Mod PT Treatments $Therapeutic Activity: 8-22 mins PT General Charges $$ ACUTE PT VISIT: 1 Visit         Kathlyn Sacramento, PT, DPT [facility]  Rehabilitation Services Physical Therapist Office: [phone] Website: [email or web address]   Berton Mount 07/08/2023,  2:45 PM

## 2023-07-08 NOTE — Telephone Encounter (Signed)
Please get her hospital follow-up with myself or APP in the next 4 weeks.  I have ordered an echocardiogram to be scheduled April 2025.  Please schedule additional follow-up visit with myself middle of April 2025 after echocardiogram.

## 2023-07-08 NOTE — Progress Notes (Signed)
  Inpatient Rehab Admissions Coordinator :  Per therapy recommendations patient was screened for CIR candidacy by Ottie Glazier RN MSN.  Patient doing very well on initial evaluation by therapy . Feel she will progress well and not need CIR level rehab. If medically ready for discharge and in need of further rehab, I would recommend seek other AIR level rehab for our beds are very limited into next week.Please contact me with any questions.  Ottie Glazier RN MSN Admissions Coordinator [phone]

## 2023-07-08 NOTE — Progress Notes (Signed)
This RN witnessed phlebotomist drawing heparin lab around 1600. Called lab at 1740 asking if they had received heparin lab and per Selena Batten lab was not received. Could not get a hold of phlebotomist after several called attempts. Pharmacy placed for new STAT collection, but lab still not collected at this time.

## 2023-07-08 NOTE — Progress Notes (Addendum)
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    Patient Measurements: Height: 5' 2.5" (158.8 cm) Weight: 122.5 kg (270 lb 1 oz) IBW/kg (Calculated) : 51.25 Heparin Dosing Weight: 81.6kg  Vital Signs: Temp: 98.7 F (37.1 C) (01/16 0338) Temp Source: Oral (01/16 0338) BP: 139/93 (01/16 0400) Pulse Rate: 101 (01/16 0400)  Labs: Recent Labs    07/06/23 0530 07/06/23 1500 07/06/23 1620 07/06/23 2219 07/07/23 0234 07/07/23 0758 07/07/23 1636 07/07/23 2330 07/08/23 0312  HGB 9.2*  --   --   --  8.6*  --   --  9.5*  --   HCT 29.9*  --   --   --  28.5*  --   --  27.5*  --   PLT 313  --   --   --  274  --   --  305  --   APTT  --   --   --  59*  --   --   --   --   --   HEPARINUNFRC  --   --   --  <0.10*  --    < > 0.33 0.42 0.29*  CREATININE 0.74  --   --   --  0.63  --   --   --   --   TROPONINIHS  --  75* 243*  --   --   --   --   --   --    < > = values in this interval not displayed.    Estimated Creatinine Clearance: 133.1 mL/min (by C-G formula based on SCr of 0.63 mg/dL).  Assessment: 27YOF presenting with R leg pain who became SOB. CTA found to be positive for acute saddle PE with evidence of R heart strain. Not on [facility] prior to admission. Pharmacy has been consulted to dose heparin. Prior to heparin gtt initiation decision to start tPA 50mg  over 2h (given 1/14 2042), now s/p infusion and to start heparin gtt with aPTT <80.  1/16 AM: Heparin level 0.29, subtherapeutic on 1800 units/hr. No issues with infusion running or signs of bleeding per RN. Hgb stable low at 9.5, PLT 305.   Goal of Therapy:  Heparin level  0.3-0.7 units/ml  Monitor platelets by anticoagulation protocol: Yes   Plan:  Increase heparin to 1900 units/hr Recheck heparin level in 6 hours Monitor heparin level, CBC, and s/sx of bleeding F/u transition to PO anticoagulation

## 2023-07-08 NOTE — Progress Notes (Signed)
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    Patient Measurements: Height: 5' 2.5" (158.8 cm) Weight: 122.5 kg (270 lb 1 oz) IBW/kg (Calculated) : 51.25 Heparin Dosing Weight: 81.6kg  Vital Signs: Temp: 98.1 F (36.7 C) (01/16 1936) Temp Source: Axillary (01/16 1936) BP: 137/93 (01/16 1800) Pulse Rate: 117 (01/16 1800)  Labs: Recent Labs    07/06/23 0530 07/06/23 1500 07/06/23 1620 07/06/23 2219 07/07/23 0234 07/07/23 0758 07/07/23 2330 07/08/23 0312 07/08/23 1050 07/08/23 1947  HGB 9.2*  --   --   --  8.6*  --  9.5*  --   --   --   HCT 29.9*  --   --   --  28.5*  --  27.5*  --   --   --   PLT 313  --   --   --  274  --  305  --   --   --   APTT  --   --   --  59*  --   --   --   --   --   --   HEPARINUNFRC  --   --   --  <0.10*  --    < > 0.42 0.29*  --  0.29*  CREATININE 0.74  --   --   --  0.63  --   --   --  0.72  --   TROPONINIHS  --  75* 243*  --   --   --   --   --   --   --    < > = values in this interval not displayed.    Estimated Creatinine Clearance: 133.1 mL/min (by C-G formula based on SCr of 0.72 mg/dL).  Assessment: 27YOF presenting with R leg pain who became SOB. CTA found to be positive for acute saddle PE with evidence of R heart strain. Not on [facility] prior to admission. Pharmacy has been consulted to dose heparin. Prior to heparin gtt initiation decision to start tPA 50mg  over 2h (given 1/14 2042), now s/p infusion and to start heparin gtt with aPTT <80.  Heparin level slightly subtherapeutic (0.29) on infusion at 1900 units/hr. No change in heparin with 100 units/hr increase earlier  today.  Goal of Therapy:  Heparin level 0.3-0.7 units/ml  Monitor platelets by anticoagulation protocol: Yes   Plan:  Bolus heparin 1000 units/hr Increase heparin infusion to 2100 units/hr Recheck heparin level in 6 hours F/u transition to PO anticoagulation  Christoper Fabian, PharmD, BCPS Please see amion for complete clinical pharmacist phone list 07/08/2023 8:19 PM

## 2023-07-09 ENCOUNTER — Other Ambulatory Visit (HOSPITAL_COMMUNITY): Payer: Self-pay

## 2023-07-09 ENCOUNTER — Inpatient Hospital Stay (HOSPITAL_COMMUNITY): Payer: Managed Care, Other (non HMO)

## 2023-07-09 ENCOUNTER — Telehealth (HOSPITAL_COMMUNITY): Payer: Self-pay

## 2023-07-09 DIAGNOSIS — I2609 Other pulmonary embolism with acute cor pulmonale: Secondary | ICD-10-CM | POA: Diagnosis not present

## 2023-07-09 DIAGNOSIS — I420 Dilated cardiomyopathy: Secondary | ICD-10-CM | POA: Diagnosis not present

## 2023-07-09 LAB — PROCALCITONIN: Procalcitonin: <0.1 ng/mL

## 2023-07-09 LAB — BASIC METABOLIC PANEL
Anion gap: 10 (ref 5–15)
BUN: 8 mg/dL (ref 6–20)
CO2: 25 mmol/L (ref 22–32)
Calcium: 8.5 mg/dL — ABNORMAL LOW (ref 8.9–10.3)
Chloride: 104 mmol/L (ref 98–111)
Creatinine, Ser: 0.65 mg/dL (ref 0.44–1.00)
GFR, Estimated: >60 mL/min (ref >60)
Glucose, Bld: 86 mg/dL (ref 70–99)
Potassium: 4.5 mmol/L (ref 3.5–5.1)
Sodium: 139 mmol/L (ref 135–145)

## 2023-07-09 LAB — HEPARIN LEVEL (UNFRACTIONATED)
Heparin Unfractionated: 0.41 [IU]/mL (ref 0.30–0.70)
Heparin Unfractionated: 0.46 [IU]/mL (ref 0.30–0.70)

## 2023-07-09 LAB — FERRITIN: Ferritin: 73 ng/mL (ref 11–307)

## 2023-07-09 LAB — PHOSPHORUS: Phosphorus: 4.1 mg/dL (ref 2.5–4.6)

## 2023-07-09 LAB — T4, FREE: Free T4: 0.89 ng/dL (ref 0.61–1.12)

## 2023-07-09 LAB — TSH: TSH: 5.99 u[IU]/mL — ABNORMAL HIGH (ref 0.350–4.500)

## 2023-07-09 LAB — MAGNESIUM: Magnesium: 2.3 mg/dL (ref 1.7–2.4)

## 2023-07-09 MED ORDER — METOPROLOL TARTRATE 25 MG PO TABS: 25.0000 mg | ORAL_TABLET | Freq: Two times a day (BID) | ORAL | Status: DC

## 2023-07-09 MED ORDER — METOPROLOL SUCCINATE ER 25 MG PO TB24
25.0000 mg | ORAL_TABLET | Freq: Every day | ORAL | Status: DC
  Administered 2023-07-09: 25 mg via ORAL
  Filled 2023-07-09: qty 1

## 2023-07-09 MED ORDER — SACUBITRIL-VALSARTAN 24-26 MG PO TABS
1.0000 | ORAL_TABLET | Freq: Two times a day (BID) | ORAL | Status: DC
  Administered 2023-07-09 – 2023-07-11 (×5): 1 via ORAL
  Filled 2023-07-09 (×6): qty 1

## 2023-07-09 MED ORDER — FOLIC ACID 1 MG PO TABS
1.0000 mg | ORAL_TABLET | Freq: Every day | ORAL | Status: DC
  Administered 2023-07-09 – 2023-07-11 (×3): 1 mg via ORAL
  Filled 2023-07-09 (×3): qty 1

## 2023-07-09 NOTE — Plan of Care (Signed)
VSS, afebrile. Meds as ordered, Heparin gtt infusing per pharmacy protocol. No s/s of bleeding. Pt alert and oriented x4, continues on 4L O2 via [place], to maintain sats >92%. Intermittent nonproductive cough. Pt stable. Report given to [facility] on [place] prior to transfer. Pt care reassigned at 0030. Pt belongings , cell phone and charger with patient.

## 2023-07-09 NOTE — Progress Notes (Signed)
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    Patient Measurements: Height: 5' 2.5" (158.8 cm) Weight: 122.5 kg (270 lb 1 oz) IBW/kg (Calculated) : 51.25 Heparin Dosing Weight: 82 kg  Vital Signs: Temp: 98.3 F (36.8 C) (01/17 1200) Temp Source: Axillary (01/17 1200) BP: 147/93 (01/17 1200) Pulse Rate: 115 (01/17 1200)  Labs: Recent Labs    07/06/23 2219 07/07/23 0234 07/07/23 0758 07/07/23 2330 07/08/23 0312 07/08/23 1050 07/08/23 1947 07/09/23 0624 07/09/23 1800  HGB  --  8.6*  --  9.5*  --   --   --   --   --   HCT  --  28.5*  --  27.5*  --   --   --   --   --   PLT  --  274  --  305  --   --   --   --   --   APTT 59*  --   --   --   --   --   --   --   --   HEPARINUNFRC <0.10*  --    < > 0.42   < >  --  0.29* 0.41 0.46  CREATININE  --  0.63  --   --   --  0.72  --  0.65  --    < > = values in this interval not displayed.    Estimated Creatinine Clearance: 133.1 mL/min (by C-G formula based on SCr of 0.65 mg/dL).  Assessment: 27YOF presenting with R leg pain who became SOB. CTA found to be positive for acute saddle PE with evidence of R heart strain. Not on [facility] prior to admission. Pharmacy has been consulted to dose heparin. Prior to heparin gtt initiation decision to start tPA 50mg  over 2h (given 1/14 2042), and s/p infusion and to start heparin gtt with aPTT <80. Duplex showed LLE DVT.   Heparin level therapeutic (0.46) on 2100 units/hr. No bleeding noted.  Goal of Therapy:  Heparin level 0.3-0.7 units/ml Monitor platelets by anticoagulation protocol: Yes   Plan:  Continue heparin drip  at 2100 units/hr. Daily heparin level and CBC while on heparin. Follow up for transition to Eliquis.  Christoper Fabian, PharmD, BCPS Please see amion for complete clinical pharmacist phone list 07/09/2023,7:45 PM

## 2023-07-09 NOTE — Progress Notes (Signed)
PROGRESS NOTE                                                                                                                                                                                                             Patient Demographics:    Amber Arroyo, is a 28 y.o. female, DOB - 1995-12-09, ZOX:[id number]  Outpatient Primary MD for the patient is Davis Gourd    LOS - 3  Admit date - 07/06/2023    Chief Complaint  Patient presents with   Leg Swelling            Brief Narrative (HPI from H&P)   28 year old woman with past medical history of asthma on as needed albuterol who presents with shortness of breath.  Initially was diagnosed with URI at an urgent care, symptoms did not get better came to the ER where she was diagnosed with saddle PE admitted to pulmonary critical care started on heparin drip transferred to my service on 07/09/2023.  Echo by the way showed a EF of around 35%.   Subjective:    Amber Arroyo today has, No headache, No chest pain, No abdominal pain - No Nausea, No new weakness tingling or numbness, no shortness of breath   Assessment  & Plan :   Acute hypoxic respiratory failure due to saddle PE, left lower extremity DVT with evidence of right heart strain on echocardiogram.  Currently on heparin drip, still tachycardic, continue heparin drip for another 24 hours, once hemodynamically more improved we will transition to Eliquis.  She is morbidly obese and takes oral contraceptive pills which could be contributing to her hypercoagulable state.  Considering her age will have her still follow-up with hematology 1 time in 2 to 3 months.  Chronic iron deficiency anemia.  Supplement oral iron, outpatient OB and PCP follow-up for age-appropriate anemia workup.  Chronic systolic heart failure EF 35%.  Unclear etiology, will request cardiology to evaluate, check UDS.  Obesity.  BMI 35.  Follow-up with  PCP.      Condition - Fair  Family Communication  : Called mother [phone] on 07/09/2023 at 9:56 AM, over 15 drinks.  No response.  Code Status :  Full  Consults  :  PCCM  PUD Prophylaxis :    Procedures  :     TTE. 1. Left ventricular  ejection fraction, by estimation, is 35 to 40%. The left ventricle has moderately decreased function. The left ventricle demonstrates global hypokinesis. There is mild left ventricular hypertrophy. Indeterminate diastolic filling due  to E-A fusion.  2. Poor RV visualization, but suspect moderate RV systolic dysfunction. Right ventricular systolic function was not well visualized. The right ventricular size is not well visualized. Tricuspid regurgitation signal is inadequate for assessing PA pressure.  3. The mitral valve is normal in structure. Trivial mitral valve regurgitation.  4. The aortic valve was not well visualized. Aortic valve regurgitation is not visualized. No aortic stenosis is present.  5. The inferior vena cava is dilated in size with <50% respiratory variability, suggesting right atrial pressure of 15 mmHg.    CTA -  1. Positive for acute saddle PE with CT evidence of right heart strain (RV/LV Ratio = 1.2) consistent with at least submassive (intermediate risk) PE. The presence of right heart strain has been associated with an increased risk of morbidity and mortality. Please refer to the "Code PE Focused" order set in EPIC. 2. Multi lobar pneumonia. Underlying pulmonary infarct in the right lower lobe is not excluded considering extensive pulmonary emboli.   Lower extremity venous duplex - No evidence of popliteal cyst, bilaterally. RIGHT: - There is no evidence of deep vein thrombosis in the lower extremity.  LEFT: - Findings consistent with acute deep vein thrombosis involving the left femoral vein, and left popliteal vein. Findings consistent with acute intramuscular thrombosis involving the left gastrocnemius veins      Disposition  Plan  :    Status is: Inpatient   DVT Prophylaxis  :  Hep gtt    Lab Results  Component Value Date   PLT 305 07/07/2023    Diet :  Diet Order             Diet regular Room service appropriate? Yes; Fluid consistency: Thin  Diet effective now                    Inpatient Medications  Scheduled Meds:  Chlorhexidine Gluconate Cloth  6 each Topical Daily   doxycycline  100 mg Oral Q12H   ferrous sulfate  325 mg Oral Q breakfast   ipratropium-albuterol  3 mL Nebulization BID   metoprolol tartrate  25 mg Oral BID   Continuous Infusions:  cefTRIAXone (ROCEPHIN)  IV Stopped (07/08/23 1806)   heparin 2,100 Units/hr (07/09/23 0919)   PRN Meds:.acetaminophen, guaiFENesin-codeine, ipratropium-albuterol, ondansetron (ZOFRAN) IV, mouth rinse  Antibiotics  :    Anti-infectives (From admission, onward)    Start     Dose/Rate Route Frequency Ordered Stop   07/08/23 2200  doxycycline (VIBRA-TABS) tablet 100 mg        100 mg Oral Every 12 hours 07/08/23 0957 07/11/23 0959   07/08/23 1800  cefTRIAXone (ROCEPHIN) 2 g in sodium chloride 0.9 % 100 mL IVPB        2 g 200 mL/hr over 30 Minutes Intravenous Every 24 hours 07/08/23 0925 07/10/23 2359   07/07/23 1800  cefTRIAXone (ROCEPHIN) 1 g in sodium chloride 0.9 % 100 mL IVPB  Status:  Discontinued        1 g 200 mL/hr over 30 Minutes Intravenous Every 24 hours 07/06/23 1835 07/08/23 0925   07/06/23 1845  azithromycin (ZITHROMAX) 500 mg in sodium chloride 0.9 % 250 mL IVPB  Status:  Discontinued        500 mg 250 mL/hr over 60 Minutes Intravenous Every  24 hours 07/06/23 1835 07/08/23 0957   07/06/23 1745  cefTRIAXone (ROCEPHIN) 1 g in sodium chloride 0.9 % 100 mL IVPB        1 g 200 mL/hr over 30 Minutes Intravenous  Once 07/06/23 1739 07/06/23 1828   07/06/23 1745  azithromycin (ZITHROMAX) 500 mg in sodium chloride 0.9 % 250 mL IVPB  Status:  Discontinued        500 mg 250 mL/hr over 60 Minutes Intravenous  Once 07/06/23 1739  07/06/23 1845         Objective:   Vitals:   07/09/23 0000 07/09/23 0038 07/09/23 0824 07/09/23 0839  BP: 121/85 129/81 (!) 136/96   Pulse: (!) 114 (!) 108 (!) 113   Resp: (!) 24 (!) 25 19   Temp:  98.2 F (36.8 C) 98 F (36.7 C)   TempSrc:  Oral Axillary   SpO2: 96% 96% 92% 95%  Weight:      Height:        Wt Readings from Last 3 Encounters:  07/06/23 122.5 kg  06/28/23 122.5 kg  04/06/23 125.6 kg     Intake/Output Summary (Last 24 hours) at 07/09/2023 0956 Last data filed at 07/08/2023 2300 Gross per 24 hour  Intake 858.6 ml  Output 700 ml  Net 158.6 ml     Physical Exam  Awake Alert, No new F.N deficits, Normal affect [place].AT,PERRAL Supple Neck, No JVD,   Symmetrical Chest wall movement, Good air movement bilaterally, CTAB RRR,No Gallops,Rubs or new Murmurs,  +ve B.Sounds, Abd Soft, No tenderness,   No Cyanosis, Clubbing or edema       Data Review:    Recent Labs  Lab 07/06/23 0530 07/07/23 0234 07/07/23 2330  WBC 8.9 10.4 7.9  HGB 9.2* 8.6* 9.5*  HCT 29.9* 28.5* 27.5*  PLT 313 274 305  MCV 71.2* 69.0* 75.1*  MCH 21.9* 20.8* 26.0  MCHC 30.8 30.2 34.5  RDW 22.5* 21.3* 23.5*  LYMPHSABS 1.3  --   --   MONOABS 0.5  --   --   EOSABS 0.2  --   --   BASOSABS 0.0  --   --     Recent Labs  Lab 07/06/23 0530 07/06/23 0535 07/06/23 0825 07/06/23 2219 07/06/23 2353 07/07/23 0234 07/08/23 1050 07/09/23 0624  NA 142  --   --   --   --  142 140 139  K 3.5  --   --   --   --  3.6 3.7 4.5  CL 103  --   --   --   --  105 104 104  CO2 28  --   --   --   --  28 27 25   ANIONGAP 11  --   --   --   --  9 9 10   GLUCOSE 100*  --   --   --   --  103* 93 86  BUN 9  --   --   --   --  9 7 8   CREATININE 0.74  --   --   --   --  0.63 0.72 0.65  PROCALCITON  --   --   --  0.18  --   --   --  <0.10  LATICACIDVEN  --  0.8 0.8  --  0.6  --   --   --   TSH  --   --   --   --   --   --   --  5.990*  BNP 56.7  --   --   --   --   --   --   --   MG  --   --   --    --   --  2.4  --  2.3  PHOS  --   --   --   --   --  5.2*  --  4.1  CALCIUM 9.1  --   --   --   --  8.5* 8.5* 8.5*      Recent Labs  Lab 07/06/23 0530 07/06/23 0535 07/06/23 0825 07/06/23 2219 07/06/23 2353 07/07/23 0234 07/08/23 1050 07/09/23 0624  PROCALCITON  --   --   --  0.18  --   --   --  <0.10  LATICACIDVEN  --  0.8 0.8  --  0.6  --   --   --   TSH  --   --   --   --   --   --   --  5.990*  BNP 56.7  --   --   --   --   --   --   --   MG  --   --   --   --   --  2.4  --  2.3  CALCIUM 9.1  --   --   --   --  8.5* 8.5* 8.5*    --------------------------------------------------------------------------------------------------------------- Lab Results  Component Value Date   CHOL 166 01/03/2021   HDL 43 01/03/2021   LDLCALC 102 (H) 01/03/2021   TRIG 113 01/03/2021   CHOLHDL 3.9 01/03/2021    No results found for: "HGBA1C" Recent Labs    07/09/23 0624  TSH 5.990*   Recent Labs    07/09/23 0624  FERRITIN 73   ------------------------------------------------------------------------------------------------------------------ Cardiac Enzymes No results for input(s): "CKMB", "TROPONINI", "MYOGLOBIN" in the last 168 hours.  Invalid input(s): "CK"  Micro Results Recent Results (from the past 240 hours)  Resp panel by RT-PCR (RSV, Flu A&B, Covid) Anterior Nasal Swab     Status: None   Collection Time: 07/06/23  1:17 AM   Specimen: Anterior Nasal Swab  Result Value Ref Range Status   SARS Coronavirus 2 by RT PCR NEGATIVE NEGATIVE Final   Influenza A by PCR NEGATIVE NEGATIVE Final   Influenza B by PCR NEGATIVE NEGATIVE Final    Comment: (NOTE) The Xpert Xpress SARS-CoV-2/FLU/RSV plus assay is intended as an aid in the diagnosis of influenza from Nasopharyngeal swab specimens and should not be used as a sole basis for treatment. Nasal washings and aspirates are unacceptable for Xpert Xpress SARS-CoV-2/FLU/RSV testing.  Fact Sheet for  Patients: BloggerCourse.com  Fact Sheet for Healthcare Providers: SeriousBroker.it  This test is not yet approved or cleared by the [place] FDA and has been authorized for detection and/or diagnosis of SARS-CoV-2 by FDA under an Emergency Use Authorization (EUA). This EUA will remain in effect (meaning this test can be used) for the duration of the COVID-19 declaration under Section 564(b)(1) of the Act, 21 U.S.C. section 360bbb-3(b)(1), unless the authorization is terminated or revoked.     Resp Syncytial Virus by PCR NEGATIVE NEGATIVE Final    Comment: (NOTE) Fact Sheet for Patients: BloggerCourse.com  Fact Sheet for Healthcare Providers: SeriousBroker.it  This test is not yet approved or cleared by the [place] FDA and has been authorized for detection and/or diagnosis of SARS-CoV-2 by FDA under an Emergency Use Authorization (EUA). This EUA will  remain in effect (meaning this test can be used) for the duration of the COVID-19 declaration under Section 564(b)(1) of the Act, 21 U.S.C. section 360bbb-3(b)(1), unless the authorization is terminated or revoked.  Performed at [facility], [address] [place], [place] [address]   MRSA Next Gen by PCR, Nasal     Status: None   Collection Time: 07/06/23  6:29 PM  Result Value Ref Range Status   MRSA by PCR Next Gen NOT DETECTED NOT DETECTED Final    Comment: (NOTE) The GeneXpert MRSA Assay (FDA approved for NASAL specimens only), is one component of a comprehensive MRSA colonization surveillance program. It is not intended to diagnose MRSA infection nor to guide or monitor treatment for MRSA infections. Test performance is not FDA approved in patients less than 72 years old. Performed at [facility], [address] [place], [place] [address]     Radiology Reports DG Chest [place] 1 View Result  Date: 07/09/2023 CLINICAL DATA:  Short of breath.  Follow-up exam. EXAM: PORTABLE CHEST 1 VIEW COMPARISON:  07/07/2023.  CT, 07/06/2023. FINDINGS: Right greater than left lung base opacities are without significant change from the prior chest radiograph allowing for differences in patient positioning and lung volumes. No new lung abnormalities. Cardiac silhouette normal in size. No pneumothorax. IMPRESSION: 1. No significant change from the most recent prior exam. Right greater than left lung base opacities consistent with pneumonia and/or atelectasis. Electronically Signed   By: Amie Portland M.D.   On: 07/09/2023 07:50   ECHOCARDIOGRAM COMPLETE Result Date: 07/07/2023    ECHOCARDIOGRAM REPORT   Patient Name:   Amber Arroyo  Date of Exam: 07/07/2023 Medical Rec #:  [id number]  Height:       62.5 in Accession #:    [id number] Weight:       270.1 lb Date of Birth:  [birth year]  BSA:          2.185 m Patient Age:    27 years   BP:           175/54 mmHg Patient Gender: F          HR:           111 bpm. Exam Location:  Inpatient Procedure: 2D Echo, Cardiac Doppler, Color Doppler and Intracardiac            Opacification Agent Indications:    Pulmonary Embolus  History:        Patient has no prior history of Echocardiogram examinations.                 Risk Factors:Hypertension.  Sonographer:    Karma Ganja Referring Phys: [id number] Henrene Hawking  Sonographer Comments: Technically difficult study due to poor echo windows and patient is obese. IMPRESSIONS  1. Left ventricular ejection fraction, by estimation, is 35 to 40%. The left ventricle has moderately decreased function. The left ventricle demonstrates global hypokinesis. There is mild left ventricular hypertrophy. Indeterminate diastolic filling due  to E-A fusion.  2. Poor RV visualization, but suspect moderate RV systolic dysfunction. Right ventricular systolic function was not well visualized. The right ventricular size is not well visualized. Tricuspid regurgitation  signal is inadequate for assessing PA pressure.  3. The mitral valve is normal in structure. Trivial mitral valve regurgitation.  4. The aortic valve was not well visualized. Aortic valve regurgitation is not visualized. No aortic stenosis is present.  5. The inferior vena cava is dilated in size with <  50% respiratory variability, suggesting right atrial pressure of 15 mmHg. FINDINGS  Left Ventricle: Left ventricular ejection fraction, by estimation, is 35 to 40%. The left ventricle has moderately decreased function. The left ventricle demonstrates global hypokinesis. Definity contrast agent was given IV to delineate the left ventricular endocardial borders. The left ventricular internal cavity size was normal in size. There is mild left ventricular hypertrophy. Indeterminate diastolic filling due to E-A fusion. Right Ventricle: The right ventricular size is not well visualized. Right vetricular wall thickness was not well visualized. Right ventricular systolic function was not well visualized. Tricuspid regurgitation signal is inadequate for assessing PA pressure. Left Atrium: Left atrial size was normal in size. Right Atrium: Right atrial size was normal in size. Pericardium: Trivial pericardial effusion is present. Mitral Valve: The mitral valve is normal in structure. Trivial mitral valve regurgitation. Tricuspid Valve: The tricuspid valve is normal in structure. Tricuspid valve regurgitation is trivial. Aortic Valve: The aortic valve was not well visualized. Aortic valve regurgitation is not visualized. No aortic stenosis is present. Aortic valve mean gradient measures 3.0 mmHg. Aortic valve peak gradient measures 6.1 mmHg. Aortic valve area, by VTI measures 2.24 cm. Pulmonic Valve: The pulmonic valve was not well visualized. Pulmonic valve regurgitation is trivial. Aorta: The aortic root and ascending aorta are structurally normal, with no evidence of dilitation. Venous: The inferior vena cava is dilated in  size with less than 50% respiratory variability, suggesting right atrial pressure of 15 mmHg. IAS/Shunts: The interatrial septum was not well visualized.  LEFT VENTRICLE PLAX 2D LVIDd:         4.90 cm      Diastology LVIDs:         4.00 cm      LV e' medial:    9.46 cm/s LV PW:         1.30 cm      LV E/e' medial:  6.1 LV IVS:        1.20 cm      LV e' lateral:   7.62 cm/s LVOT diam:     2.20 cm      LV E/e' lateral: 7.5 LV SV:         40 LV SV Index:   18 LVOT Area:     3.80 cm  LV Volumes (MOD) LV vol d, MOD A2C: 112.0 ml LV vol d, MOD A4C: 84.6 ml LV vol s, MOD A2C: 68.7 ml LV vol s, MOD A4C: 52.6 ml LV SV MOD A2C:     43.3 ml LV SV MOD A4C:     84.6 ml LV SV MOD BP:      36.2 ml RIGHT VENTRICLE            IVC RV Basal diam:  3.80 cm    IVC diam: 2.10 cm RV S prime:     6.64 cm/s TAPSE (M-mode): 1.8 cm LEFT ATRIUM           Index        RIGHT ATRIUM           Index LA diam:      4.00 cm 1.83 cm/m   RA Area:     17.50 cm LA Vol (A2C): 41.0 ml 18.76 ml/m  RA Volume:   50.20 ml  22.98 ml/m LA Vol (A4C): 22.2 ml 10.16 ml/m  AORTIC VALVE AV Area (Vmax):    2.10 cm AV Area (Vmean):   2.11 cm AV Area (VTI):  2.24 cm AV Vmax:           123.00 cm/s AV Vmean:          86.800 cm/s AV VTI:            0.178 m AV Peak Grad:      6.1 mmHg AV Mean Grad:      3.0 mmHg LVOT Vmax:         68.00 cm/s LVOT Vmean:        48.100 cm/s LVOT VTI:          0.105 m LVOT/AV VTI ratio: 0.59  AORTA Ao Root diam: 3.20 cm Ao Asc diam:  2.70 cm MITRAL VALVE MV Area (PHT): 8.43 cm    SHUNTS MV Decel Time: 90 msec     Systemic VTI:  0.10 m MV E velocity: 57.50 cm/s  Systemic Diam: 2.20 cm MV A velocity: 81.20 cm/s MV E/A ratio:  0.71 Epifanio Lesches MD Electronically signed by Epifanio Lesches MD Signature Date/Time: 07/07/2023/10:37:06 AM    Final    DG Chest Port 1 View Result Date: 07/07/2023 CLINICAL DATA:  28 year old female with history of respiratory failure. EXAM: PORTABLE CHEST 1 VIEW COMPARISON:  Chest x-ray  07/06/2023. FINDINGS: Low lung volumes with bibasilar opacities which may reflect areas of atelectasis and/or consolidation, with superimposed small bilateral pleural effusions. No pneumothorax. No evidence of pulmonary edema. Heart size is borderline enlarged, likely accentuated by low lung volumes and portable AP technique. Upper mediastinal contours are within normal limits. IMPRESSION: 1. Low lung volumes with persistent bibasilar areas of atelectasis and/or consolidation and superimposed small bilateral pleural effusions. Electronically Signed   By: Trudie Reed M.D.   On: 07/07/2023 06:52   VAS [place] LOWER EXTREMITY VENOUS (DVT) (ONLY [facility] & [facility]) Result Date: 07/06/2023  Lower Venous DVT Study Patient Name:  Amber Arroyo  Date of Exam:   07/06/2023 Medical Rec #: [id number]  Accession #:    [id number] Date of Birth: February 09, 1996  Patient Gender: F Patient Age:   64 years Exam Location:  [facility] Procedure:      VAS [place] LOWER EXTREMITY VENOUS (DVT) Referring Phys: Jomarie Longs STEVENS --------------------------------------------------------------------------------  Indications: SOB, and Pain.  Risk Factors: Obesity. Limitations: Poor patient position (lying on right side). Comparison Study: Previous exams all negative for DVT (04/07/23, 11/19/20,                   07/03/17, 12/22/15, 12/26/15) Performing Technologist: Ernestene Mention RVT, RDMS  Examination Guidelines: A complete evaluation includes B-mode imaging, spectral Doppler, color Doppler, and power Doppler as needed of all accessible portions of each vessel. Bilateral testing is considered an integral part of a complete examination. Limited examinations for reoccurring indications may be performed as noted. The reflux portion of the exam is performed with the patient in reverse Trendelenburg.  +---------+---------------+---------+-----------+----------+-------------------+ RIGHT    CompressibilityPhasicitySpontaneityPropertiesThrombus Aging       +---------+---------------+---------+-----------+----------+-------------------+ CFV      Full           Yes      Yes                                      +---------+---------------+---------+-----------+----------+-------------------+ SFJ      Full                                                             +---------+---------------+---------+-----------+----------+-------------------+  FV Prox  Full           Yes      Yes                                      +---------+---------------+---------+-----------+----------+-------------------+ FV Mid   Full           Yes      Yes                                      +---------+---------------+---------+-----------+----------+-------------------+ FV DistalFull           Yes      Yes                                      +---------+---------------+---------+-----------+----------+-------------------+ PFV      Full                                                             +---------+---------------+---------+-----------+----------+-------------------+ POP      Full           Yes      Yes                                      +---------+---------------+---------+-----------+----------+-------------------+ PTV                                                   Not well visualized +---------+---------------+---------+-----------+----------+-------------------+ PERO                                                  Not well visualized +---------+---------------+---------+-----------+----------+-------------------+   +---------+---------------+---------+-----------+----------+------------------+ LEFT     CompressibilityPhasicitySpontaneityPropertiesThrombus Aging     +---------+---------------+---------+-----------+----------+------------------+ CFV      Full           Yes      Yes                                     +---------+---------------+---------+-----------+----------+------------------+ SFJ       Full                                                            +---------+---------------+---------+-----------+----------+------------------+ FV Prox  Full           Yes      Yes                                     +---------+---------------+---------+-----------+----------+------------------+  FV Mid   Full           Yes      Yes                                     +---------+---------------+---------+-----------+----------+------------------+ FV DistalNone           No       No                   Acute              +---------+---------------+---------+-----------+----------+------------------+ PFV                     Yes      Yes                  patent by                                                                color/doppler      +---------+---------------+---------+-----------+----------+------------------+ POP      None           No       No                   Acute              +---------+---------------+---------+-----------+----------+------------------+ PTV                                                   Not visualized     +---------+---------------+---------+-----------+----------+------------------+ PERO                                                  Not visualized     +---------+---------------+---------+-----------+----------+------------------+ Gastroc  None           No       No                   Acute              +---------+---------------+---------+-----------+----------+------------------+     Summary: BILATERAL: -No evidence of popliteal cyst, bilaterally. RIGHT: - There is no evidence of deep vein thrombosis in the lower extremity.  LEFT: - Findings consistent with acute deep vein thrombosis involving the left femoral vein, and left popliteal vein. Findings consistent with acute intramuscular thrombosis involving the left gastrocnemius veins.  *See table(s) above for measurements and observations. Electronically  signed by Carolynn Sayers on 07/06/2023 at 9:26:52 PM.    Final    CT Angio Chest PE W and/or Wo Contrast Result Date: 07/06/2023 CLINICAL DATA:  Cough and shortness of breath. Concern for pulmonary embolism. EXAM: CT ANGIOGRAPHY CHEST WITH CONTRAST TECHNIQUE: Multidetector CT imaging of the chest was performed using the standard protocol during bolus administration of intravenous contrast. Multiplanar CT image reconstructions and MIPs were obtained to evaluate the vascular anatomy. RADIATION  DOSE REDUCTION: This exam was performed according to the departmental dose-optimization program which includes automated exposure control, adjustment of the mA and/or kV according to patient size and/or use of iterative reconstruction technique. CONTRAST:  60mL OMNIPAQUE IOHEXOL 350 MG/ML SOLN COMPARISON:  Chest radiograph dated 07/06/2023. FINDINGS: Cardiovascular: There is no cardiomegaly or pericardial effusion. The thoracic aorta is unremarkable. The origins of the great vessels of the aortic arch appear patent. There is a saddle embolus straddling the bifurcation of the main pulmonary trunk. Large bilateral central pulmonary artery emboli extending into the lobar and segmental branches. There is dilatation of the right heart chambers with RV/LV ratio of approximately 1.2 in keeping with right heart straining. Mediastinum/Nodes: Mildly enlarged right hilar lymph node. Subcarinal lymph node measures approximately 2 cm in short axis. The esophagus and the thyroid gland are grossly unremarkable. No mediastinal fluid collection. Lungs/Pleura: Large area of consolidation in the right lower lobe as well as smaller consolidation in the right middle lobe. Scattered nodular densities in the right lower lobe, right middle lobe, and left lower lobe. Findings most consistent with multi lobar pneumonia. Underlying pulmonary infarct in the right lower lobe is not excluded considering extensive pulmonary emboli. No pleural effusion or  pneumothorax. The central airways are patent. Upper Abdomen: No acute abnormality. Musculoskeletal: No acute osseous pathology. Review of the MIP images confirms the above findings. IMPRESSION: 1. Positive for acute saddle PE with CT evidence of right heart strain (RV/LV Ratio = 1.2) consistent with at least submassive (intermediate risk) PE. The presence of right heart strain has been associated with an increased risk of morbidity and mortality. Please refer to the "Code PE Focused" order set in [facility]. 2. Multi lobar pneumonia. Underlying pulmonary infarct in the right lower lobe is not excluded considering extensive pulmonary emboli. These results were called by telephone at the time of interpretation on 07/06/2023 at 5:25 pm to Dr. Maris Berger, who verbally acknowledged these results. Electronically Signed   By: Elgie Collard M.D.   On: 07/06/2023 17:29   DG Chest 2 View Result Date: 07/06/2023 CLINICAL DATA:  28 year old female with history of cough and shortness of breath. EXAM: CHEST - 2 VIEW COMPARISON:  Chest x-ray 09/18/2020. FINDINGS: Lung volumes are normal. Bibasilar opacities which may reflect areas of atelectasis and/or consolidation (right-greater-than-left). Small right pleural effusion. No definite left pleural effusion. No pneumothorax. No evidence of pulmonary edema. Heart size is upper limits of normal. Upper mediastinal contours are within normal limits. IMPRESSION: 1. Bibasilar opacities, most evident in the right lower lobe, which may reflect areas of atelectasis and/or consolidation. Small right pleural effusion. Followup PA and lateral chest X-ray is recommended in 3-4 weeks following trial of antibiotic therapy to ensure resolution. Electronically Signed   By: Trudie Reed M.D.   On: 07/06/2023 06:15      Signature  -   Susa Raring M.D on 07/09/2023 at 9:56 AM   -  To page go to [email or web address]

## 2023-07-09 NOTE — Progress Notes (Addendum)
Heart Failure Stewardship Pharmacist Progress Note   PCP: Kathreen Cornfield, PA-C PCP-Cardiologist: None    HPI:  28 yo F with PMH asthma, hypertension, obesity. Patient presented to ED via EMS after almost passing out when standing up at home. Patient reported increased SOB, productive cough, and chest congestion over the past couple weeks. Was seen in urgent care and treated for URI, COVID and flu negative. For the past week reports she has been bed bound and about 2 weeks ago she had leg swelling followed by worsening SOB. Presented to ED found to be hypoxemic and tachycardic. CT PE study on 1/14 confirmed acute saddle PE with right heart strain and evidence of pneumonia. Vascular ultrasound on 1/14 also showed acute DVT involving the left femoral vein, and popliteal vein extending to the gastrocnemius vein. Patient was given half dose alteplase then started on heparin gtt with plan to transition to Eliquis. Also started on ceftriaxone and azithromycin for CAP. ECHO on 1/15 showed EF 35-40%, global hypokinesis, mild LVH, moderate RV dysfunction, trivial MR, dilated inferior vena cava.    Patient reports she does have unprotected sex, not planning to become pregnant. Not on oral contraceptives and does not use barrier methods. Discussed that with the new medications she will be started on (Eliquis, HF GDMT) she will need to use contraceptive methods going forward. She expressed understanding and stated she would use barrier methods.   Patient reports improvement in her breathing and experiencing less discomfort when walking. Remains tacycardic and on 4L O2.   Current HF Medications: Beta Blocker: metoprolol tartrate 25 mg BID  Prior to admission HF Medications: None  Pertinent Lab Values: Serum creatinine 0.65, BUN 8, Potassium 4.5, Sodium 139, BNP 56.7, Magnesium 2.3  Vital Signs: Admission weight: 270 lbs Blood pressure: 130-140/90s  Heart rate: 100-120s   Medication Assistance /  Insurance Benefits Check: Does the patient have prescription insurance?  Yes Type of insurance plan: [employer]  Outpatient Pharmacy:  Prior to admission outpatient pharmacy[facility] Is the patient willing to use [facility] TOC pharmacy at discharge? Yes Is the patient willing to transition their outpatient pharmacy to utilize a [facility] outpatient pharmacy?   Yes     Assessment: 1. Acute systolic CHF (LVEF 35-40%), due to unknown etiology. Patient with acute PE with right heart stain. Could also be hypertensive cardiomyopathy. NYHA class II symptoms. - Does not appear overtly volume overloaded on exam - Agree with adding metoprolol tartrate 25 mg BID - however, will need to consolidate to metoprolol XL with HFrEF - Consider starting Entresto 24/26 mg BID - Consider addition of spironolactone and Farxiga tomorrow if BP tolerates    Plan: 1) Medication changes recommended at this time: - Transition metoprolol tartrate to metoprolol XL 25 mg daily - Start Entresto 24/26 mg BID  2) Patient assistance: - Patient has Comptroller  - Entresto copay $45 - copay card lowers to $10 - Farxiga requires prior authorization - completing today. Copay card will lower to $10 - Eliquis copay card lowers cost to $10 - Patient agreeable to utilizing [facility] mail order pharmacy. Will activate copay cards and input into system.   3)  Education  - Patient has been educated on current HF medications and potential additions to HF medication regimen - Patient verbalizes understanding that over the next few months, these medication doses may change and more medications may be added to optimize HF regimen - Patient has been educated on basic disease state pathophysiology and goals  of therapy    Sharen Hones, PharmD, BCPS Heart Failure Stewardship Pharmacist Phone [phone]

## 2023-07-09 NOTE — Progress Notes (Signed)
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    Patient Measurements: Height: 5' 2.5" (158.8 cm) Weight: 122.5 kg (270 lb 1 oz) IBW/kg (Calculated) : 51.25 Heparin Dosing Weight: 82 kg  Vital Signs: Temp: 98.3 F (36.8 C) (01/17 1200) Temp Source: Axillary (01/17 1200) BP: 147/93 (01/17 1200) Pulse Rate: 115 (01/17 1200)  Labs: Recent Labs    07/06/23 1620 07/06/23 2219 07/07/23 0234 07/07/23 0758 07/07/23 2330 07/08/23 0312 07/08/23 1050 07/08/23 1947 07/09/23 0624  HGB  --   --  8.6*  --  9.5*  --   --   --   --   HCT  --   --  28.5*  --  27.5*  --   --   --   --   PLT  --   --  274  --  305  --   --   --   --   APTT  --  59*  --   --   --   --   --   --   --   HEPARINUNFRC  --  <0.10*  --    < > 0.42 0.29*  --  0.29* 0.41  CREATININE  --   --  0.63  --   --   --  0.72  --  0.65  TROPONINIHS 243*  --   --   --   --   --   --   --   --    < > = values in this interval not displayed.    Estimated Creatinine Clearance: 133.1 mL/min (by C-G formula based on SCr of 0.65 mg/dL).  Assessment: 27YOF presenting with R leg pain who became SOB. CTA found to be positive for acute saddle PE with evidence of R heart strain. Not on [facility] prior to admission. Pharmacy has been consulted to dose heparin. Prior to heparin gtt initiation decision to start tPA 50mg  over 2h (given 1/14 2042), and s/p infusion and to start heparin gtt with aPTT <80. Duplex showed LLE DVT.   Heparin level slightly subtherapeutic (0.29) last night on 1900 units/hr and dose adjusted. Heparin level therapeutic (0.41) this morning on 2100  units/hr.  Daily CBC ordered but last results 07/07/23.  No bleeding reported.   Goal of Therapy:  Heparin level 0.3-0.7 units/ml Monitor platelets by anticoagulation protocol: Yes   Plan:  Continue heparin drip at 2100 units/hr. Heparin level tonight to be sure that it remains therapeutic. Daily heparin level and CBC while on heparin. Follow up for transition to Eliquis.  Dennie Fetters, RPh 07/09/2023,3:01 PM

## 2023-07-09 NOTE — Evaluation (Signed)
Occupational Therapy Evaluation Patient Details Name: Amber Arroyo MRN: [id number] DOB: Aug 23, 1995 Today's Date: 07/09/2023   History of Present Illness Amber Arroyo is a 28 year old woman who presents 1/14 with shortness of breath.   CT PE study confirmed acute pulmonary embolism with saddle distribution and right heart strain. Questionable wedge-shaped infarct versus pneumonia in the right upper lobe. PMHx: asthma on as needed albuterol.   Clinical Impression   Pt admitted for above, she is very determined and motivated to perform mobility however she is limited by her HR elevating beyond safe measures. Pt overall ambulatory with CGA with and without AD, reports he leg pain was non-existent during OT session. She continues to need increased supplemental 02 when ambulating and currently needs min to CGA for ADLs. OT to continue following pt acutely to address listed deficits and help transition to next level of care. Anticipate pt will not need post acute OT after making recovery in acute setting.        If plan is discharge home, recommend the following: A little help with bathing/dressing/bathroom;Assistance with cooking/housework    Functional Status Assessment  Patient has had a recent decline in their functional status and demonstrates the ability to make significant improvements in function in a reasonable and predictable amount of time.  Equipment Recommendations  None recommended by OT    Recommendations for Other Services       Precautions / Restrictions Precautions Precautions: None Precaution Comments: monitor HR, O2 Restrictions Weight Bearing Restrictions Per Provider Order: No      Mobility Bed Mobility Overal bed mobility: Modified Independent                  Transfers Overall transfer level: Needs assistance Equipment used: Rolling walker (2 wheels) Transfers: Sit to/from Stand Sit to Stand: Contact guard assist                  Balance Overall  balance assessment: Needs assistance Sitting-balance support: No upper extremity supported, Feet supported Sitting balance-Leahy Scale: Good     Standing balance support: During functional activity Standing balance-Leahy Scale: Fair Standing balance comment: short distance no AD                           ADL either performed or assessed with clinical judgement   ADL Overall ADL's : Needs assistance/impaired Eating/Feeding: Independent;Sitting   Grooming: Standing;Contact guard assist   Upper Body Bathing: Standing;Contact guard assist   Lower Body Bathing: Sitting/lateral leans;Set up   Upper Body Dressing : Set up;Sitting   Lower Body Dressing: Sitting/lateral leans;Minimal assistance   Toilet Transfer: Contact guard assist;Ambulation;Rolling walker (2 wheels)   Toileting- Clothing Manipulation and Hygiene: Sitting/lateral lean;Set up       Functional mobility during ADLs: Contact guard assist;Rolling walker (2 wheels) General ADL Comments: pt ambulatory in hall ~42ft with RW, 1 seated rest break needed due to HR hitting high of 141 bpm. Trialed 25ft ambulation no AD     Vision         Perception         Praxis         Pertinent Vitals/Pain Pain Assessment Pain Assessment: No/denies pain     Extremity/Trunk Assessment Upper Extremity Assessment Upper Extremity Assessment: Generalized weakness   Lower Extremity Assessment Lower Extremity Assessment: Generalized weakness   Cervical / Trunk Assessment Cervical / Trunk Assessment: Normal   Communication Communication Communication: No apparent difficulties Cueing Techniques: Verbal  cues   Cognition Arousal: Alert Behavior During Therapy: WFL for tasks assessed/performed Overall Cognitive Status: Within Functional Limits for tasks assessed                                       General Comments  Sp02 92% on 6L while ambualtory, was on 4L at rest. HR up to 141 bpm max with  hall mobility, but mainly around 120-133 while resting sitting EOB and with shorter distance gait. Pt denied any leg pain during session    Exercises     Shoulder Instructions      Home Living Family/patient expects to be discharged to:: Private residence Living Arrangements: Other (Comment);Alone (Boyfriend stays with her some) Available Help at Discharge: Available PRN/intermittently Type of Home: Apartment Home Access: Stairs to enter Entrance Stairs-Number of Steps: 3rd floor Entrance Stairs-Rails: Right;Left Home Layout: One level     Bathroom Shower/Tub: Chief Strategy Officer: Standard     Home Equipment: None          Prior Functioning/Environment Prior Level of Function : Independent/Modified Independent;Working/employed             Mobility Comments: ind ADLs Comments: ind        OT Problem List: Decreased strength;Impaired balance (sitting and/or standing);Cardiopulmonary status limiting activity;Decreased activity tolerance;Obesity      OT Treatment/Interventions: Self-care/ADL training;DME and/or AE instruction;Therapeutic activities;Balance training;Therapeutic exercise;Patient/family education    OT Goals(Current goals can be found in the care plan section) Acute Rehab OT Goals Patient Stated Goal: To get better OT Goal Formulation: With patient Time For Goal Achievement: 07/23/23 Potential to Achieve Goals: Good ADL Goals Pt Will Perform Grooming: with modified independence;standing Pt Will Perform Lower Body Dressing: with modified independence;sit to/from stand Pt Will Transfer to Toilet: with modified independence;ambulating Additional ADL Goal #2: Pt will take seated rest breaks prn to maintain HR below 140 bpm with activity Additional ADL Goal #4: Pt will complete a 3 step household iADL with supervision  OT Frequency: Min 1X/week    Co-evaluation              AM-PAC OT "6 Clicks" Daily Activity     Outcome Measure  Help from another person eating meals?: None Help from another person taking care of personal grooming?: A Little Help from another person toileting, which includes using toliet, bedpan, or urinal?: A Little Help from another person bathing (including washing, rinsing, drying)?: A Little Help from another person to put on and taking off regular upper body clothing?: A Little Help from another person to put on and taking off regular lower body clothing?: A Little 6 Click Score: 19   End of Session Equipment Utilized During Treatment: Gait belt;Rolling walker (2 wheels) Nurse Communication: Mobility status  Activity Tolerance: Patient tolerated treatment well Patient left: in bed;with call bell/phone within reach;with family/visitor present  OT Visit Diagnosis: Other abnormalities of gait and mobility (R26.89)                Time: 4098-1191 OT Time Calculation (min): 36 min Charges:  OT General Charges $OT Visit: 1 Visit OT Evaluation $OT Eval Moderate Complexity: 1 Mod OT Treatments $Therapeutic Activity: 8-22 mins  07/09/2023  AB, OTR/L  Acute Rehabilitation Services  Office: [phone]   Tristan Schroeder 07/09/2023, 4:45 PM

## 2023-07-09 NOTE — Plan of Care (Signed)
  Problem: Clinical Measurements: Goal: Respiratory complications will improve Outcome: Progressing   Problem: Activity: Goal: Risk for activity intolerance will decrease Outcome: Progressing   Problem: Pain Management: Goal: General experience of comfort will improve Outcome: Progressing   Problem: Safety: Goal: Ability to remain free from injury will improve Outcome: Progressing

## 2023-07-09 NOTE — Consult Note (Addendum)
Incomplete     Expand All Collapse All      Cardiology Consultation    Patient ID: Amber Arroyo MRN: [id number]; DOB: 02/25/1996   Admit date: 07/06/2023 Date of Consult: 07/09/2023   PCP:  Kathreen Cornfield, PA-C               HeartCare Providers Cardiologist:  None    Patient Profile:    Amber Arroyo is a 28 y.o. female with a hx of asthma, hypertension, obesity who is being seen 07/09/2023 for the evaluation of new onset HFrEF at the request of Dr. Thedore Mins.   History of Present Illness:    [name] has no prior cardiac or family history of cardiac disease.  She reports no smoking, drinking, drugs, alcohol.  She had reported URI-like symptoms since seen at the urgent care however given worsening shortness of breath came to the emergency room.   Currently patient being evaluated for new onset acute saddle PE with right heart strain and multifocal pneumonia.  Doppler ultrasound showed acute DVT in the left femoral vein and popliteal vein extending into the gastrocnemius vein.  Initially admitted to critical care and was given half a dose of alteplase and started on heparin drip.  Now transferred to the floor and being treated for PE and started on antibiotics.  Echocardiogram during admission showed EF 35 to 40% with global hypokinesis, mild LVH with moderate RV dysfunction.  Cardiology asked to evaluate.   Patient reports no significant shortness of breath at this time however is still on supplemental oxygen.  However, overall feels much improved but still has persistent cough.  She denies any chest pain, orthopnea, significant peripheral edema but does admit to some swelling.  No decrease in exercise intolerance or difficulty ambulating.  She reports working 2 different jobs and constantly on her feet.  Also says that she has not been on any oral contraceptives.   She remains tachycardic in the 110s.  On 4 to 6 L of oxygen.  Current labs show normal renal function.  BNP 56.7.   Troponin 75-243.  Hemoglobin 9.5.  MCV 75.1, hematocrit 27.5.  TSH 5.99.  Chest x-ray today does not show significant change from prior exam.  Still has lung opacities consistent with pneumonia in the left lung base. Neg COVID/flu         Past Medical History:  Diagnosis Date   Allergy      eczema, seasonal allergies   Asthma     Eczema     Essential hypertension     Obesity, Class III, BMI 40-49.9 (morbid obesity) (HCC) 11/18/2020               Past Surgical History:  Procedure Laterality Date   INCISION AND DRAINAGE ABSCESS Left 02/04/2013    Procedure: INCISION AND DRAINAGE POSTERIOR NECK ABSCESS;  Surgeon: Flo Shanks, MD;  Location: [facility] OR;  Service: ENT;  Laterality: Left;   MOUTH SURGERY        Wisdom Teeth Extraction          Inpatient Medications: Scheduled Meds:  Chlorhexidine Gluconate Cloth  6 each Topical Daily   doxycycline  100 mg Oral Q12H   ferrous sulfate  325 mg Oral Q breakfast   folic acid  1 mg Oral Daily   ipratropium-albuterol  3 mL Nebulization BID   metoprolol tartrate  25 mg Oral BID        Continuous Infusions:  cefTRIAXone (ROCEPHIN)  IV Stopped (07/08/23 1806)  heparin 2,100 Units/hr (07/09/23 0919)        PRN Meds: acetaminophen, guaiFENesin-codeine, ipratropium-albuterol, ondansetron (ZOFRAN) IV, mouth rinse       Allergies:    Allergies       Allergies  Allergen Reactions   Fish-Derived Products Shortness Of Breath, Swelling and Other (See Comments)      Lips swell   Shellfish-Derived Products Shortness Of Breath, Swelling and Other (See Comments)      Lips swell   Tree Extract Other (See Comments)      Patient was told she is allergic to trees        Social History:   Social History         Socioeconomic History   Marital status: Single      Spouse name: Not on file   Number of children: 0   Years of education: Not on file   Highest education level: High school graduate  Occupational History   Not on file   Tobacco Use   Smoking status: Never   Smokeless tobacco: Never  Vaping Use   Vaping status: Never Used  Substance and Sexual Activity   Alcohol use: No   Drug use: No   Sexual activity: Yes      Birth control/protection: Condom, None  Other Topics Concern   Not on file  Social History Narrative    Lives with boyfriend.  No pets.    Social Drivers of Acupuncturist Strain: Low Risk  (07/08/2023)    Overall Financial Resource Strain (CARDIA)     Difficulty of Paying Living Expenses: Not very hard  Food Insecurity: No Food Insecurity (04/06/2023)    Hunger Vital Sign     Worried About Running Out of Food in the Last Year: Never true     Ran Out of Food in the Last Year: Never true  Transportation Needs: No Transportation Needs (07/08/2023)    PRAPARE - Therapist, art (Medical): No     Lack of Transportation (Non-Medical): No  Physical Activity: Not on file  Stress: Not on file  Social Connections: Not on file  Intimate Partner Violence: Not At Risk (04/06/2023)    Humiliation, Afraid, Rape, and Kick questionnaire     Fear of Current or Ex-Partner: No     Emotionally Abused: No     Physically Abused: No     Sexually Abused: No    Family History:        Family History  Problem Relation Age of Onset   Hypertension Mother     Asthma Mother     Eczema Mother     Hypertension Father            ROS:  Please see the history of present illness.  All other ROS reviewed and negative.      Physical Exam/Data:          Vitals:    07/09/23 0000 07/09/23 0038 07/09/23 0824 07/09/23 0839  BP: 121/85 129/81 (!) 136/96    Pulse: (!) 114 (!) 108 (!) 113    Resp: (!) 24 (!) 25 19    Temp:   98.2 F (36.8 C) 98 F (36.7 C)    TempSrc:   Oral Axillary    SpO2: 96% 96% 92% 95%  Weight:          Height:  Intake/Output Summary (Last 24 hours) at 07/09/2023 1133 Last data filed at 07/08/2023 2300    Gross per 24 hour   Intake 820.64 ml  Output 700 ml  Net 120.64 ml        07/06/2023    5:00 PM 06/28/2023    7:14 PM 04/06/2023    3:21 PM  Last 3 Weights  Weight (lbs) 270 lb 1 oz 270 lb 277 lb  Weight (kg) 122.5 kg 122.471 kg 125.646 kg     Body mass index is 48.61 kg/m.  General:  Well nourished, well developed, in no acute distress.  On supplemental oxygen HEENT: normal Neck: Difficult to assess JVD Vascular: No carotid bruits; Distal pulses 2+ bilaterally Cardiac:  normal S1, S2; RRR; no murmur.  Tachycardic Lungs:  clear to auscultation bilaterally, no wheezing, rhonchi or rales  Abd: soft, nontender, no hepatomegaly  Ext: 1+ edema L leg  Musculoskeletal:  No deformities, BUE and BLE strength normal and equal Skin: warm and dry  Neuro:  CNs 2-12 intact, no focal abnormalities noted Psych:  Normal affect    EKG:  The EKG was personally reviewed and demonstrates: Sinus tachycardia, heart rate 112.  S1, Q waves in inferolateral leads. T wave inversions anteriorly too.  QTc 510.  Left anterior fascicular block, incomplete IVCD Telemetry:  Telemetry was personally reviewed and demonstrates: Sinus tachycardia heart rates in the 110s   Relevant CV Studies: Echocardiogram 07/07/2023 1. Left ventricular ejection fraction, by estimation, is 35 to 40%. The  left ventricle has moderately decreased function. The left ventricle  demonstrates global hypokinesis. There is mild left ventricular  hypertrophy. Indeterminate diastolic filling due   to E-A fusion.   2. Poor RV visualization, but suspect moderate RV systolic dysfunction.  Right ventricular systolic function was not well visualized. The right  ventricular size is not well visualized. Tricuspid regurgitation signal is  inadequate for assessing PA  pressure.   3. The mitral valve is normal in structure. Trivial mitral valve  regurgitation.   4. The aortic valve was not well visualized. Aortic valve regurgitation  is not visualized. No aortic  stenosis is present.   5. The inferior vena cava is dilated in size with <50% respiratory  variability, suggesting right atrial pressure of 15 mmHg.  Laboratory Data:   High Sensitivity Troponin:   Last Labs      Recent Labs  Lab 07/06/23 1500 07/06/23 1620  TROPONINIHS 75* 243*       Chemistry Last Labs       Recent Labs  Lab 07/07/23 0234 07/08/23 1050 07/09/23 0624  NA 142 140 139  K 3.6 3.7 4.5  CL 105 104 104  CO2 28 27 25   GLUCOSE 103* 93 86  BUN 9 7 8   CREATININE 0.63 0.72 0.65  CALCIUM 8.5* 8.5* 8.5*  MG 2.4  --  2.3  GFRNONAA >60 >60 >60  ANIONGAP 9 9 10       Last Labs  No results for input(s): "PROT", "ALBUMIN", "AST", "ALT", "ALKPHOS", "BILITOT" in the last 168 hours.   Lipids  Last Labs  No results for input(s): "CHOL", "TRIG", "HDL", "LABVLDL", "LDLCALC", "CHOLHDL" in the last 168 hours.    Hematology Last Labs       Recent Labs  Lab 07/06/23 0530 07/07/23 0234 07/07/23 2330  WBC 8.9 10.4 7.9  RBC 4.20 4.13 3.66*  HGB 9.2* 8.6* 9.5*  HCT 29.9* 28.5* 27.5*  MCV 71.2* 69.0* 75.1*  MCH 21.9* 20.8* 26.0  MCHC 30.8 30.2 34.5  RDW 22.5* 21.3* 23.5*  PLT 313 274 305      Thyroid  Last Labs     Recent Labs  Lab 07/09/23 0624  TSH 5.990*      BNP Last Labs     Recent Labs  Lab 07/06/23 0530  BNP 56.7      DDimer  Last Labs  No results for input(s): "DDIMER" in the last 168 hours.       Radiology/Studies:  DG Chest Port 1 View Result Date: 07/09/2023 CLINICAL DATA:  Short of breath.  Follow-up exam. EXAM: PORTABLE CHEST 1 VIEW COMPARISON:  07/07/2023.  CT, 07/06/2023. FINDINGS: Right greater than left lung base opacities are without significant change from the prior chest radiograph allowing for differences in patient positioning and lung volumes. No new lung abnormalities. Cardiac silhouette normal in size. No pneumothorax. IMPRESSION: 1. No significant change from the most recent prior exam. Right greater than left lung base  opacities consistent with pneumonia and/or atelectasis. Electronically Signed   By: Amie Portland M.D.   On: 07/09/2023 07:50    ECHOCARDIOGRAM COMPLETE Result Date: 07/07/2023    ECHOCARDIOGRAM REPORT   Patient Name:   DARREL HOLTMEYER  Date of Exam: 07/07/2023 Medical Rec #:  [id number]  Height:       62.5 in Accession #:    [id number] Weight:       270.1 lb Date of Birth:  [birth year]  BSA:          2.185 m Patient Age:    27 years   BP:           175/54 mmHg Patient Gender: F          HR:           111 bpm. Exam Location:  Inpatient Procedure: 2D Echo, Cardiac Doppler, Color Doppler and Intracardiac            Opacification Agent Indications:    Pulmonary Embolus  History:        Patient has no prior history of Echocardiogram examinations.                 Risk Factors:Hypertension.  Sonographer:    Karma Ganja Referring Phys: [id number] Henrene Hawking  Sonographer Comments: Technically difficult study due to poor echo windows and patient is obese. IMPRESSIONS  1. Left ventricular ejection fraction, by estimation, is 35 to 40%. The left ventricle has moderately decreased function. The left ventricle demonstrates global hypokinesis. There is mild left ventricular hypertrophy. Indeterminate diastolic filling due  to E-A fusion.  2. Poor RV visualization, but suspect moderate RV systolic dysfunction. Right ventricular systolic function was not well visualized. The right ventricular size is not well visualized. Tricuspid regurgitation signal is inadequate for assessing PA pressure.  3. The mitral valve is normal in structure. Trivial mitral valve regurgitation.  4. The aortic valve was not well visualized. Aortic valve regurgitation is not visualized. No aortic stenosis is present.  5. The inferior vena cava is dilated in size with <50% respiratory variability, suggesting right atrial pressure of 15 mmHg. FINDINGS  Left Ventricle: Left ventricular ejection fraction, by estimation, is 35 to 40%. The left ventricle has  moderately decreased function. The left ventricle demonstrates global hypokinesis. Definity contrast agent was given IV to delineate the left ventricular endocardial borders. The left ventricular internal cavity size was normal in size. There is mild left ventricular hypertrophy. Indeterminate diastolic filling due to E-A fusion. Right Ventricle:  The right ventricular size is not well visualized. Right vetricular wall thickness was not well visualized. Right ventricular systolic function was not well visualized. Tricuspid regurgitation signal is inadequate for assessing PA pressure. Left Atrium: Left atrial size was normal in size. Right Atrium: Right atrial size was normal in size. Pericardium: Trivial pericardial effusion is present. Mitral Valve: The mitral valve is normal in structure. Trivial mitral valve regurgitation. Tricuspid Valve: The tricuspid valve is normal in structure. Tricuspid valve regurgitation is trivial. Aortic Valve: The aortic valve was not well visualized. Aortic valve regurgitation is not visualized. No aortic stenosis is present. Aortic valve mean gradient measures 3.0 mmHg. Aortic valve peak gradient measures 6.1 mmHg. Aortic valve area, by VTI measures 2.24 cm. Pulmonic Valve: The pulmonic valve was not well visualized. Pulmonic valve regurgitation is trivial. Aorta: The aortic root and ascending aorta are structurally normal, with no evidence of dilitation. Venous: The inferior vena cava is dilated in size with less than 50% respiratory variability, suggesting right atrial pressure of 15 mmHg. IAS/Shunts: The interatrial septum was not well visualized.  LEFT VENTRICLE PLAX 2D LVIDd:         4.90 cm      Diastology LVIDs:         4.00 cm      LV e' medial:    9.46 cm/s LV PW:         1.30 cm      LV E/e' medial:  6.1 LV IVS:        1.20 cm      LV e' lateral:   7.62 cm/s LVOT diam:     2.20 cm      LV E/e' lateral: 7.5 LV SV:         40 LV SV Index:   18 LVOT Area:     3.80 cm  LV  Volumes (MOD) LV vol d, MOD A2C: 112.0 ml LV vol d, MOD A4C: 84.6 ml LV vol s, MOD A2C: 68.7 ml LV vol s, MOD A4C: 52.6 ml LV SV MOD A2C:     43.3 ml LV SV MOD A4C:     84.6 ml LV SV MOD BP:      36.2 ml RIGHT VENTRICLE            IVC RV Basal diam:  3.80 cm    IVC diam: 2.10 cm RV S prime:     6.64 cm/s TAPSE (M-mode): 1.8 cm LEFT ATRIUM           Index        RIGHT ATRIUM           Index LA diam:      4.00 cm 1.83 cm/m   RA Area:     17.50 cm LA Vol (A2C): 41.0 ml 18.76 ml/m  RA Volume:   50.20 ml  22.98 ml/m LA Vol (A4C): 22.2 ml 10.16 ml/m  AORTIC VALVE AV Area (Vmax):    2.10 cm AV Area (Vmean):   2.11 cm AV Area (VTI):     2.24 cm AV Vmax:           123.00 cm/s AV Vmean:          86.800 cm/s AV VTI:            0.178 m AV Peak Grad:      6.1 mmHg AV Mean Grad:      3.0 mmHg LVOT Vmax:         68.00  cm/s LVOT Vmean:        48.100 cm/s LVOT VTI:          0.105 m LVOT/AV VTI ratio: 0.59  AORTA Ao Root diam: 3.20 cm Ao Asc diam:  2.70 cm MITRAL VALVE MV Area (PHT): 8.43 cm    SHUNTS MV Decel Time: 90 msec     Systemic VTI:  0.10 m MV E velocity: 57.50 cm/s  Systemic Diam: 2.20 cm MV A velocity: 81.20 cm/s MV E/A ratio:  0.71 Epifanio Lesches MD Electronically signed by Epifanio Lesches MD Signature Date/Time: 07/07/2023/10:37:06 AM    Final     DG Chest Port 1 View Result Date: 07/07/2023 CLINICAL DATA:  28 year old female with history of respiratory failure. EXAM: PORTABLE CHEST 1 VIEW COMPARISON:  Chest x-ray 07/06/2023. FINDINGS: Low lung volumes with bibasilar opacities which may reflect areas of atelectasis and/or consolidation, with superimposed small bilateral pleural effusions. No pneumothorax. No evidence of pulmonary edema. Heart size is borderline enlarged, likely accentuated by low lung volumes and portable AP technique. Upper mediastinal contours are within normal limits. IMPRESSION: 1. Low lung volumes with persistent bibasilar areas of atelectasis and/or consolidation and  superimposed small bilateral pleural effusions. Electronically Signed   By: Trudie Reed M.D.   On: 07/07/2023 06:52    VAS [place] LOWER EXTREMITY VENOUS (DVT) (ONLY [facility] & [facility]) Result Date: 07/06/2023  Lower Venous DVT Study Patient Name:  SEVANA SEAWARD  Date of Exam:   07/06/2023 Medical Rec #: [id number]  Accession #:    [id number] Date of Birth: [birth year]  Patient Gender: F Patient Age:   48 years Exam Location:  [facility] Procedure:      VAS [place] LOWER EXTREMITY VENOUS (DVT) Referring Phys: Jomarie Longs STEVENS --------------------------------------------------------------------------------  Indications: SOB, and Pain.  Risk Factors: Obesity. Limitations: Poor patient position (lying on right side). Comparison Study: Previous exams all negative for DVT (04/07/23, 11/19/20,                   07/03/17, 12/22/15, 12/26/15) Performing Technologist: Ernestene Mention RVT, RDMS  Examination Guidelines: A complete evaluation includes B-mode imaging, spectral Doppler, color Doppler, and power Doppler as needed of all accessible portions of each vessel. Bilateral testing is considered an integral part of a complete examination. Limited examinations for reoccurring indications may be performed as noted. The reflux portion of the exam is performed with the patient in reverse Trendelenburg.  +---------+---------------+---------+-----------+----------+-------------------+ RIGHT    CompressibilityPhasicitySpontaneityPropertiesThrombus Aging      +---------+---------------+---------+-----------+----------+-------------------+ CFV      Full           Yes      Yes                                      +---------+---------------+---------+-----------+----------+-------------------+ SFJ      Full                                                             +---------+---------------+---------+-----------+----------+-------------------+ FV Prox  Full           Yes      Yes                                       +---------+---------------+---------+-----------+----------+-------------------+  FV Mid   Full           Yes      Yes                                      +---------+---------------+---------+-----------+----------+-------------------+ FV DistalFull           Yes      Yes                                      +---------+---------------+---------+-----------+----------+-------------------+ PFV      Full                                                             +---------+---------------+---------+-----------+----------+-------------------+ POP      Full           Yes      Yes                                      +---------+---------------+---------+-----------+----------+-------------------+ PTV                                                   Not well visualized +---------+---------------+---------+-----------+----------+-------------------+ PERO                                                  Not well visualized +---------+---------------+---------+-----------+----------+-------------------+   +---------+---------------+---------+-----------+----------+------------------+ LEFT     CompressibilityPhasicitySpontaneityPropertiesThrombus Aging     +---------+---------------+---------+-----------+----------+------------------+ CFV      Full           Yes      Yes                                     +---------+---------------+---------+-----------+----------+------------------+ SFJ      Full                                                            +---------+---------------+---------+-----------+----------+------------------+ FV Prox  Full           Yes      Yes                                     +---------+---------------+---------+-----------+----------+------------------+ FV Mid   Full           Yes      Yes                                     +---------+---------------+---------+-----------+----------+------------------+  FV  DistalNone           No       No                   Acute              +---------+---------------+---------+-----------+----------+------------------+ PFV                     Yes      Yes                  patent by                                                                color/doppler      +---------+---------------+---------+-----------+----------+------------------+ POP      None           No       No                   Acute              +---------+---------------+---------+-----------+----------+------------------+ PTV                                                   Not visualized     +---------+---------------+---------+-----------+----------+------------------+ PERO                                                  Not visualized     +---------+---------------+---------+-----------+----------+------------------+ Gastroc  None           No       No                   Acute              +---------+---------------+---------+-----------+----------+------------------+     Summary: BILATERAL: -No evidence of popliteal cyst, bilaterally. RIGHT: - There is no evidence of deep vein thrombosis in the lower extremity.  LEFT: - Findings consistent with acute deep vein thrombosis involving the left femoral vein, and left popliteal vein. Findings consistent with acute intramuscular thrombosis involving the left gastrocnemius veins.  *See table(s) above for measurements and observations. Electronically signed by Carolynn Sayers on 07/06/2023 at 9:26:52 PM.    Final     CT Angio Chest PE W and/or Wo Contrast Result Date: 07/06/2023 CLINICAL DATA:  Cough and shortness of breath. Concern for pulmonary embolism. EXAM: CT ANGIOGRAPHY CHEST WITH CONTRAST TECHNIQUE: Multidetector CT imaging of the chest was performed using the standard protocol during bolus administration of intravenous contrast. Multiplanar CT image reconstructions and MIPs were obtained to evaluate the vascular  anatomy. RADIATION DOSE REDUCTION: This exam was performed according to the departmental dose-optimization program which includes automated exposure control, adjustment of the mA and/or kV according to patient size and/or use of iterative reconstruction technique. CONTRAST:  60mL OMNIPAQUE IOHEXOL 350 MG/ML SOLN COMPARISON:  Chest radiograph dated 07/06/2023. FINDINGS: Cardiovascular: There is no cardiomegaly or pericardial effusion. The  thoracic aorta is unremarkable. The origins of the great vessels of the aortic arch appear patent. There is a saddle embolus straddling the bifurcation of the main pulmonary trunk. Large bilateral central pulmonary artery emboli extending into the lobar and segmental branches. There is dilatation of the right heart chambers with RV/LV ratio of approximately 1.2 in keeping with right heart straining. Mediastinum/Nodes: Mildly enlarged right hilar lymph node. Subcarinal lymph node measures approximately 2 cm in short axis. The esophagus and the thyroid gland are grossly unremarkable. No mediastinal fluid collection. Lungs/Pleura: Large area of consolidation in the right lower lobe as well as smaller consolidation in the right middle lobe. Scattered nodular densities in the right lower lobe, right middle lobe, and left lower lobe. Findings most consistent with multi lobar pneumonia. Underlying pulmonary infarct in the right lower lobe is not excluded considering extensive pulmonary emboli. No pleural effusion or pneumothorax. The central airways are patent. Upper Abdomen: No acute abnormality. Musculoskeletal: No acute osseous pathology. Review of the MIP images confirms the above findings. IMPRESSION: 1. Positive for acute saddle PE with CT evidence of right heart strain (RV/LV Ratio = 1.2) consistent with at least submassive (intermediate risk) PE. The presence of right heart strain has been associated with an increased risk of morbidity and mortality. Please refer to the "Code PE  Focused" order set in EPIC. 2. Multi lobar pneumonia. Underlying pulmonary infarct in the right lower lobe is not excluded considering extensive pulmonary emboli. These results were called by telephone at the time of interpretation on 07/06/2023 at 5:25 pm to Dr. Maris Berger, who verbally acknowledged these results. Electronically Signed   By: Elgie Collard M.D.   On: 07/06/2023 17:29    DG Chest 2 View Result Date: 07/06/2023 CLINICAL DATA:  28 year old female with history of cough and shortness of breath. EXAM: CHEST - 2 VIEW COMPARISON:  Chest x-ray 09/18/2020. FINDINGS: Lung volumes are normal. Bibasilar opacities which may reflect areas of atelectasis and/or consolidation (right-greater-than-left). Small right pleural effusion. No definite left pleural effusion. No pneumothorax. No evidence of pulmonary edema. Heart size is upper limits of normal. Upper mediastinal contours are within normal limits. IMPRESSION: 1. Bibasilar opacities, most evident in the right lower lobe, which may reflect areas of atelectasis and/or consolidation. Small right pleural effusion. Followup PA and lateral chest X-ray is recommended in 3-4 weeks following trial of antibiotic therapy to ensure resolution. Electronically Signed   By: Trudie Reed M.D.   On: 07/06/2023 06:15        Assessment and Plan:    New onset HFrEF 35-40% This is in the setting of acute saddle PE with evidence of right heart strain on echocardiogram.  She has global hypokinesis, mild LVH.  Moderate RV dysfunction. Although not septal flattening not documented, RV overload can cause interventricular septal deviation toward the LV in diastole and decreased RV systolic function, thereby limiting LV filling and left-sided cardiac output. Euvolemic, no significant symptoms.  Will need repeat echocardiogram in approximately 3 months to assess EF.  At that time may consider ischemic evaluation. GDMT: Entresto 24-26mg  BID, consolidate to Toprol-XL 25mg . Will  need to keep BP log and bring to appointments.    Acute saddle PE + L DVT Recently immobile due to illness. Currently on heparin.  Transition to Eliquis when appropriate, will discuss with MD about duration.  She reports no history or current use of oral contraceptives aside of progesterone multiple months ago.    Thyroid dysfunction TSH almost 6.  I  ordered T4.  Primary team to follow-up.   Multifocal pneumonia Anemia Obesity Per primary team       Risk Assessment/Risk Scores:    New York Heart Association (NYHA) Functional Class NYHA Class II       For questions or updates, please contact [facility] Please consult [email or web address] for contact info under      Signed, Abagail Kitchens, PA-C  07/09/2023 11:33 AM         As above, patient seen and examined.  Briefly she is a 28 year old female with past medical history of asthma, obesity, hypertension admitted with pulmonary embolus for evaluation of cardiomyopathy.  No prior cardiac history.  Patient states that the week prior to admission she developed a URI with cough.  She then developed sudden dyspnea.  She was admitted with pulmonary embolus and received half dose thrombolytic therapy.  Echocardiogram showed cardiomyopathy and cardiology now asked to evaluate.  Note her dyspnea has improved since admission.  She denies chest pain.  There is been no recent travel or surgeries.  She denies recent birth control use.  No family history of DVT/pulmonary embolus or hypercoagulable state. Lower extremity venous Dopplers showed acute DVT in the left femoral vein and left gastrocnemius vein.  Echocardiogram showed ejection fraction 35 to 40%, mild left ventricular hypertrophy, probable moderate RV dysfunction.  CTA showed acute saddle pulmonary embolus with CT evidence of right heart strain and possible multi lobar pneumonia.  COVID-negative.  Beta-hCG negative.  BNP 56.7.  Troponin 75 and 243.  TSH 5.990.  Hemoglobin 9.5 with MCV  75.  1 cardiomyopathy-etiology unclear.  No history of alcohol use.  Question hypertensive mediated.  Will discontinue Lopressor and treat with Toprol 25 mg daily.  Add Entresto 24/26 twice daily.  Follow blood pressure.  If blood pressure allows we will then add Farxiga 10 mg daily and spironolactone 12.5 mg daily.  Titrate medications as tolerated.  Would plan to repeat echocardiogram in 3 months.  If ejection fraction remains decreased would need further evaluation including cardiac MRI.  Doubt ischemia given patient's age.  2 pulmonary embolus-this is felt to be provoked with decreased mobility over the preceding week due to upper respiratory infection, obesity and reduced LV function.  She denies use of birth control pills.  Would discontinue IV heparin and begin apixaban (10 mg twice daily for 1 week then 5 mg twice daily thereafter).  Duration of anticoagulation to be determined.  If LV function does not improve may need lifelong anticoagulation.  Note her pulmonary embolus was a saddle embolus with RV strain and required half dose thrombolytic therapy.  Also note there has been no recent travel or surgical procedures.  3 microcytic anemia-likely secondary to menstruation.  Will need to have hemoglobin followed closely with addition of anticoagulation.  4 hypertension-patient's blood pressure has been elevated.  We are adding Toprol and Entresto as outlined above.  Follow blood pressure and advance medications as needed.  Olga Millers, MD

## 2023-07-09 NOTE — Progress Notes (Signed)
   Heart Failure Stewardship Pharmacist Progress Note   Was successful in obtaining a copay card for [facility].  This copay card will make the patients copay $10.  The billing information is as follows and has been shared with the patient and [facility] system.   RxBin: [id number] PCN: [id number] Member ID: [id number] Group ID: [id number]  Was successful in obtaining a copay card for Eliquis.  This copay card will make the patients copay $10.  The billing information is as follows and has been shared with the patient and [facility] system.   RxBin: [id number] PCN: Loyalty Member ID: [id number] Group ID: [id number]  Was successful in obtaining a copay card for Farxiga.  This copay card will make the patients copay $0.  The billing information is as follows and has been shared with the patient and [facility] system.   RxBin: [id number] PCN: CN Member ID: [id number] Group ID: [id number]  Sharen Hones, PharmD, BCPS Heart Failure Stewardship Pharmacist Phone [phone]

## 2023-07-09 NOTE — Telephone Encounter (Signed)
Patient Advocate Encounter  Prior authorization for [name] has been submitted and approved. Test billing returns $75 for 90 day supply. This pt is eligible to use copay savings card.  Key: [id number] Effective: 07/09/2023 to 06/21/2098  Burnell Blanks, CPhT Rx Patient Advocate Phone: [phone]

## 2023-07-09 NOTE — Progress Notes (Signed)
   07/09/23 1008  Mobility  Activity Ambulated with assistance in room  Level of Assistance Contact guard assist, steadying assist  Assistive Device Front wheel walker  Distance Ambulated (ft) 20 ft  Activity Response Tolerated fair  Mobility Referral Yes  Mobility visit 1 Mobility  Mobility Specialist Start Time (ACUTE ONLY) [id number]  Mobility Specialist Stop Time (ACUTE ONLY) 1008  Mobility Specialist Time Calculation (min) (ACUTE ONLY) 40 min   Mobility Specialist: Progress Note  Pre-Mobility: HR 115,  SpO2 92% 4L During Mobility: HR 130s, SpO2 86-92% 4L Post-Mobility: HR 123, SpO2 91% 4L  Pt agreeable to mobility session - received in bed. Required CG using RW x1 standing rest break, x1 seated rest break for SPO2 recovery with PLB. C/o  RLE pain rated 9/10 . Returned to bed with all needs met - call bell within reach. Further mobility deferred in hall d/t maintenance in front of doorway.   Barnie Mort, BS Mobility Specialist Please contact via SecureChat or Rehab office at [phone].

## 2023-07-10 ENCOUNTER — Other Ambulatory Visit (HOSPITAL_COMMUNITY): Payer: Self-pay

## 2023-07-10 DIAGNOSIS — I420 Dilated cardiomyopathy: Secondary | ICD-10-CM | POA: Diagnosis not present

## 2023-07-10 LAB — HEPARIN LEVEL (UNFRACTIONATED): Heparin Unfractionated: 0.6 [IU]/mL (ref 0.30–0.70)

## 2023-07-10 LAB — CBC
HCT: 28 % — ABNORMAL LOW (ref 36.0–46.0)
Hemoglobin: 8.7 g/dL — ABNORMAL LOW (ref 12.0–15.0)
MCH: 22.6 pg — ABNORMAL LOW (ref 26.0–34.0)
MCHC: 31.1 g/dL (ref 30.0–36.0)
MCV: 72.7 fL — ABNORMAL LOW (ref 80.0–100.0)
Platelets: 331 10*3/uL (ref 150–400)
RBC: 3.85 MIL/uL — ABNORMAL LOW (ref 3.87–5.11)
RDW: 22.7 % — ABNORMAL HIGH (ref 11.5–15.5)
WBC: 6.1 10*3/uL (ref 4.0–10.5)
nRBC: 0.5 % — ABNORMAL HIGH (ref 0.0–0.2)

## 2023-07-10 LAB — PROCALCITONIN: Procalcitonin: <0.1 ng/mL

## 2023-07-10 LAB — BASIC METABOLIC PANEL
Anion gap: 7 (ref 5–15)
BUN: 8 mg/dL (ref 6–20)
CO2: 28 mmol/L (ref 22–32)
Calcium: 8.7 mg/dL — ABNORMAL LOW (ref 8.9–10.3)
Chloride: 102 mmol/L (ref 98–111)
Creatinine, Ser: 0.56 mg/dL (ref 0.44–1.00)
GFR, Estimated: >60 mL/min (ref >60)
Glucose, Bld: 89 mg/dL (ref 70–99)
Potassium: 4.2 mmol/L (ref 3.5–5.1)
Sodium: 137 mmol/L (ref 135–145)

## 2023-07-10 LAB — MAGNESIUM: Magnesium: 2.2 mg/dL (ref 1.7–2.4)

## 2023-07-10 LAB — C-REACTIVE PROTEIN: CRP: 5.4 mg/dL — ABNORMAL HIGH (ref <1.0)

## 2023-07-10 MED ORDER — METOPROLOL SUCCINATE ER 50 MG PO TB24
50.0000 mg | ORAL_TABLET | Freq: Every day | ORAL | Status: DC
  Administered 2023-07-10 – 2023-07-11 (×2): 50 mg via ORAL
  Filled 2023-07-10 (×2): qty 1

## 2023-07-10 MED ORDER — APIXABAN 5 MG PO TABS: 5.0000 mg | ORAL_TABLET | Freq: Two times a day (BID) | ORAL | Status: DC

## 2023-07-10 MED ORDER — METOPROLOL SUCCINATE ER 50 MG PO TB24
50.0000 mg | ORAL_TABLET | Freq: Every day | ORAL | 0 refills | Status: DC
  Filled 2023-07-10: qty 30, 30d supply, fill #0

## 2023-07-10 MED ORDER — SPIRONOLACTONE 12.5 MG HALF TABLET
12.5000 mg | ORAL_TABLET | Freq: Every day | ORAL | Status: DC
  Administered 2023-07-10 – 2023-07-11 (×2): 12.5 mg via ORAL
  Filled 2023-07-10 (×2): qty 1

## 2023-07-10 MED ORDER — APIXABAN 5 MG PO TABS
10.0000 mg | ORAL_TABLET | Freq: Two times a day (BID) | ORAL | Status: DC
  Administered 2023-07-10 – 2023-07-11 (×3): 10 mg via ORAL
  Filled 2023-07-10 (×3): qty 2

## 2023-07-10 MED ORDER — SACUBITRIL-VALSARTAN 24-26 MG PO TABS
1.0000 | ORAL_TABLET | Freq: Two times a day (BID) | ORAL | 0 refills | Status: DC
  Filled 2023-07-10: qty 60, 30d supply, fill #0

## 2023-07-10 MED ORDER — EMPAGLIFLOZIN 10 MG PO TABS
10.0000 mg | ORAL_TABLET | Freq: Every day | ORAL | 0 refills | Status: DC
  Filled 2023-07-10: qty 30, 30d supply, fill #0

## 2023-07-10 MED ORDER — APIXABAN 5 MG PO TABS
ORAL_TABLET | ORAL | 0 refills | Status: DC
  Filled 2023-07-10: qty 80, 34d supply, fill #0

## 2023-07-10 MED ORDER — FOLIC ACID 1 MG PO TABS
1.0000 mg | ORAL_TABLET | Freq: Every day | ORAL | 0 refills | Status: AC
  Filled 2023-07-10: qty 30, 30d supply, fill #0

## 2023-07-10 MED ORDER — SPIRONOLACTONE 25 MG PO TABS
12.5000 mg | ORAL_TABLET | Freq: Every day | ORAL | 0 refills | Status: DC
  Filled 2023-07-10: qty 15, 30d supply, fill #0

## 2023-07-10 MED ORDER — DAPAGLIFLOZIN PROPANEDIOL 10 MG PO TABS
10.0000 mg | ORAL_TABLET | Freq: Every day | ORAL | Status: DC
  Administered 2023-07-10 – 2023-07-11 (×2): 10 mg via ORAL
  Filled 2023-07-10 (×2): qty 1

## 2023-07-10 NOTE — Progress Notes (Signed)
Mobility Specialist: Progress Note   07/10/23 1241  Mobility  Activity Ambulated with assistance in hallway  Level of Assistance Contact guard assist, steadying assist  Assistive Device Front wheel walker  Distance Ambulated (ft) 75 ft  Activity Response Tolerated well  Mobility Referral Yes  Mobility visit 1 Mobility  Mobility Specialist Start Time (ACUTE ONLY) 1022  Mobility Specialist Stop Time (ACUTE ONLY) 1048  Mobility Specialist Time Calculation (min) (ACUTE ONLY) 26 min    Pt was agreeable to mobility session - received in bed. Pt has a stuffy nose but stated it has been like that for past couple days. C/o LLE pain and pressure upon standing, opted to use RW this session. CG throuhgout. Desat a few times to SpO2 83-86% 4LO2 but would recover [facility] with a standing break and cues for PLB. Returned to room without fault. Left in chair with all needs met, call bell in reach.   Maurene Capes Mobility Specialist Please contact via SecureChat or Rehab office at [phone]

## 2023-07-10 NOTE — Progress Notes (Signed)
PROGRESS NOTE                                                                                                                                                                                                             Patient Demographics:    Amber Arroyo, is a 28 y.o. female, DOB - [birth year], SWF:[id number]  Outpatient Primary MD for the patient is Davis Gourd    LOS - 4  Admit date - 07/06/2023    Chief Complaint  Patient presents with   Leg Swelling            Brief Narrative (HPI from H&P)   28 year old woman with past medical history of asthma on as needed albuterol who presents with shortness of breath.  Initially was diagnosed with URI at an urgent care, symptoms did not get better came to the ER where she was diagnosed with saddle PE admitted to pulmonary critical care started on heparin drip transferred to my service on 07/09/2023.  Echo by the way showed a EF of around 35%.   Subjective:   Patient in bed, appears comfortable, denies any headache, no fever, no chest pain or pressure, no shortness of breath , no abdominal pain. No new focal weakness.   Assessment  & Plan :   Acute hypoxic respiratory failure due to saddle PE, left lower extremity DVT with evidence of right heart strain on echocardiogram.  Was placed on heparin drip, much improved clinically on 07/10/2023 transition to Eliquis.  She is morbidly obese and takes oral contraceptive pills which could be contributing to her hypercoagulable state.  Considering her age will have her still follow-up with hematology 1 time in 2 to 3 months.  Chronic iron deficiency anemia.  Supplement oral iron, outpatient OB and PCP follow-up for age-appropriate anemia workup.  Chronic systolic heart failure EF 35%.  Unclear etiology, etiology on board, treat PE, treat blood pressure repeat echocardiogram in 3 months, may require cardiac MRI.  Obesity.  BMI 35.   Follow-up with PCP.  Hypertension.  Placed on beta-blocker.      Condition - Fair  Family Communication  : Called mother [phone] on 07/09/2023 at 9:56 AM, over 15 rings.  No response.  Code Status :  Full  Consults  :  PCCM  PUD Prophylaxis :    Procedures  :  TTE. 1. Left ventricular ejection fraction, by estimation, is 35 to 40%. The left ventricle has moderately decreased function. The left ventricle demonstrates global hypokinesis. There is mild left ventricular hypertrophy. Indeterminate diastolic filling due  to E-A fusion.  2. Poor RV visualization, but suspect moderate RV systolic dysfunction. Right ventricular systolic function was not well visualized. The right ventricular size is not well visualized. Tricuspid regurgitation signal is inadequate for assessing PA pressure.  3. The mitral valve is normal in structure. Trivial mitral valve regurgitation.  4. The aortic valve was not well visualized. Aortic valve regurgitation is not visualized. No aortic stenosis is present.  5. The inferior vena cava is dilated in size with <50% respiratory variability, suggesting right atrial pressure of 15 mmHg.    CTA -  1. Positive for acute saddle PE with CT evidence of right heart strain (RV/LV Ratio = 1.2) consistent with at least submassive (intermediate risk) PE. The presence of right heart strain has been associated with an increased risk of morbidity and mortality. Please refer to the "Code PE Focused" order set in EPIC. 2. Multi lobar pneumonia. Underlying pulmonary infarct in the right lower lobe is not excluded considering extensive pulmonary emboli.   Lower extremity venous duplex - No evidence of popliteal cyst, bilaterally. RIGHT: - There is no evidence of deep vein thrombosis in the lower extremity.  LEFT: - Findings consistent with acute deep vein thrombosis involving the left femoral vein, and left popliteal vein. Findings consistent with acute intramuscular thrombosis  involving the left gastrocnemius veins      Disposition Plan  :    Status is: Inpatient   DVT Prophylaxis  :  Hep gtt    Lab Results  Component Value Date   PLT 331 07/10/2023    Diet :  Diet Order             Diet regular Room service appropriate? Yes; Fluid consistency: Thin  Diet effective now                    Inpatient Medications  Scheduled Meds:  Chlorhexidine Gluconate Cloth  6 each Topical Daily   dapagliflozin propanediol  10 mg Oral Daily   doxycycline  100 mg Oral Q12H   ferrous sulfate  325 mg Oral Q breakfast   folic acid  1 mg Oral Daily   ipratropium-albuterol  3 mL Nebulization BID   metoprolol succinate  50 mg Oral Daily   sacubitril-valsartan  1 tablet Oral BID   spironolactone  12.5 mg Oral Daily   Continuous Infusions:  cefTRIAXone (ROCEPHIN)  IV 2 g (07/09/23 1909)   heparin 2,100 Units/hr (07/09/23 2204)   PRN Meds:.acetaminophen, guaiFENesin-codeine, ipratropium-albuterol, ondansetron (ZOFRAN) IV, mouth rinse   Objective:   Vitals:   07/10/23 0046 07/10/23 0436 07/10/23 0500 07/10/23 0742  BP: (!) 131/106 (!) 147/99    Pulse: (!) 103 89    Resp: (!) 25 (!) 21    Temp: 97.9 F (36.6 C) 97.9 F (36.6 C)    TempSrc: Axillary Axillary    SpO2: 95% 91%  (!) 74%  Weight:   122.9 kg   Height:        Wt Readings from Last 3 Encounters:  07/10/23 122.9 kg  06/28/23 122.5 kg  04/06/23 125.6 kg    No intake or output data in the 24 hours ending 07/10/23 0839    Physical Exam  Awake Alert, No new F.N deficits, Normal affect [place].AT,PERRAL Supple Neck, No  JVD,   Symmetrical Chest wall movement, Good air movement bilaterally, CTAB RRR,No Gallops,Rubs or new Murmurs,  +ve B.Sounds, Abd Soft, No tenderness,   No Cyanosis, Clubbing or edema       Data Review:    Recent Labs  Lab 07/06/23 0530 07/07/23 0234 07/07/23 2330 07/10/23 0532  WBC 8.9 10.4 7.9 6.1  HGB 9.2* 8.6* 9.5* 8.7*  HCT 29.9* 28.5* 27.5* 28.0*  PLT  313 274 305 331  MCV 71.2* 69.0* 75.1* 72.7*  MCH 21.9* 20.8* 26.0 22.6*  MCHC 30.8 30.2 34.5 31.1  RDW 22.5* 21.3* 23.5* 22.7*  LYMPHSABS 1.3  --   --   --   MONOABS 0.5  --   --   --   EOSABS 0.2  --   --   --   BASOSABS 0.0  --   --   --     Recent Labs  Lab 07/06/23 0530 07/06/23 0535 07/06/23 0825 07/06/23 2219 07/06/23 2353 07/07/23 0234 07/08/23 1050 07/09/23 0624 07/10/23 0532  NA 142  --   --   --   --  142 140 139 137  K 3.5  --   --   --   --  3.6 3.7 4.5 4.2  CL 103  --   --   --   --  105 104 104 102  CO2 28  --   --   --   --  28 27 25 28   ANIONGAP 11  --   --   --   --  9 9 10 7   GLUCOSE 100*  --   --   --   --  103* 93 86 89  BUN 9  --   --   --   --  9 7 8 8   CREATININE 0.74  --   --   --   --  0.63 0.72 0.65 0.56  CRP  --   --   --   --   --   --   --   --  5.4*  PROCALCITON  --   --   --  0.18  --   --   --  <0.10 <0.10  LATICACIDVEN  --  0.8 0.8  --  0.6  --   --   --   --   TSH  --   --   --   --   --   --   --  5.990*  --   BNP 56.7  --   --   --   --   --   --   --   --   MG  --   --   --   --   --  2.4  --  2.3 2.2  PHOS  --   --   --   --   --  5.2*  --  4.1  --   CALCIUM 9.1  --   --   --   --  8.5* 8.5* 8.5* 8.7*      Recent Labs  Lab 07/06/23 0530 07/06/23 0535 07/06/23 0825 07/06/23 2219 07/06/23 2353 07/07/23 0234 07/08/23 1050 07/09/23 0624 07/10/23 0532  CRP  --   --   --   --   --   --   --   --  5.4*  PROCALCITON  --   --   --  0.18  --   --   --  <0.10 <0.10  LATICACIDVEN  --  0.8 0.8  --  0.6  --   --   --   --   TSH  --   --   --   --   --   --   --  5.990*  --   BNP 56.7  --   --   --   --   --   --   --   --   MG  --   --   --   --   --  2.4  --  2.3 2.2  CALCIUM 9.1  --   --   --   --  8.5* 8.5* 8.5* 8.7*    --------------------------------------------------------------------------------------------------------------- Lab Results  Component Value Date   CHOL 166 01/03/2021   HDL 43 01/03/2021   LDLCALC 102 (H)  01/03/2021   TRIG 113 01/03/2021   CHOLHDL 3.9 01/03/2021    No results found for: "HGBA1C" Recent Labs    07/09/23 0624 07/09/23 1139  TSH 5.990*  --   FREET4  --  0.89   Recent Labs    07/09/23 0624  FERRITIN 73   ------------------------------------------------------------------------------------------------------------------ Cardiac Enzymes No results for input(s): "CKMB", "TROPONINI", "MYOGLOBIN" in the last 168 hours.  Invalid input(s): "CK"  Micro Results Recent Results (from the past 240 hours)  Resp panel by RT-PCR (RSV, Flu A&B, Covid) Anterior Nasal Swab     Status: None   Collection Time: 07/06/23  1:17 AM   Specimen: Anterior Nasal Swab  Result Value Ref Range Status   SARS Coronavirus 2 by RT PCR NEGATIVE NEGATIVE Final   Influenza A by PCR NEGATIVE NEGATIVE Final   Influenza B by PCR NEGATIVE NEGATIVE Final    Comment: (NOTE) The Xpert Xpress SARS-CoV-2/FLU/RSV plus assay is intended as an aid in the diagnosis of influenza from Nasopharyngeal swab specimens and should not be used as a sole basis for treatment. Nasal washings and aspirates are unacceptable for Xpert Xpress SARS-CoV-2/FLU/RSV testing.  Fact Sheet for Patients: BloggerCourse.com  Fact Sheet for Healthcare Providers: SeriousBroker.it  This test is not yet approved or cleared by the [place] FDA and has been authorized for detection and/or diagnosis of SARS-CoV-2 by FDA under an Emergency Use Authorization (EUA). This EUA will remain in effect (meaning this test can be used) for the duration of the COVID-19 declaration under Section 564(b)(1) of the Act, 21 U.S.C. section 360bbb-3(b)(1), unless the authorization is terminated or revoked.     Resp Syncytial Virus by PCR NEGATIVE NEGATIVE Final    Comment: (NOTE) Fact Sheet for Patients: BloggerCourse.com  Fact Sheet for Healthcare  Providers: SeriousBroker.it  This test is not yet approved or cleared by the [place] FDA and has been authorized for detection and/or diagnosis of SARS-CoV-2 by FDA under an Emergency Use Authorization (EUA). This EUA will remain in effect (meaning this test can be used) for the duration of the COVID-19 declaration under Section 564(b)(1) of the Act, 21 U.S.C. section 360bbb-3(b)(1), unless the authorization is terminated or revoked.  Performed at [facility], [address] [place], [place] [address]   MRSA Next Gen by PCR, Nasal     Status: None   Collection Time: 07/06/23  6:29 PM  Result Value Ref Range Status   MRSA by PCR Next Gen NOT DETECTED NOT DETECTED Final    Comment: (NOTE) The GeneXpert MRSA Assay (FDA approved for NASAL specimens only), is one component of a comprehensive MRSA colonization surveillance program. It  is not intended to diagnose MRSA infection nor to guide or monitor treatment for MRSA infections. Test performance is not FDA approved in patients less than 41 years old. Performed at [facility], [address] [place], [place] [address]     Radiology Reports DG Chest [place] 1 View Result Date: 07/09/2023 CLINICAL DATA:  Short of breath.  Follow-up exam. EXAM: PORTABLE CHEST 1 VIEW COMPARISON:  07/07/2023.  CT, 07/06/2023. FINDINGS: Right greater than left lung base opacities are without significant change from the prior chest radiograph allowing for differences in patient positioning and lung volumes. No new lung abnormalities. Cardiac silhouette normal in size. No pneumothorax. IMPRESSION: 1. No significant change from the most recent prior exam. Right greater than left lung base opacities consistent with pneumonia and/or atelectasis. Electronically Signed   By: Amie Portland M.D.   On: 07/09/2023 07:50   ECHOCARDIOGRAM COMPLETE Result Date: 07/07/2023    ECHOCARDIOGRAM REPORT   Patient Name:   Amber Arroyo  Date  of Exam: 07/07/2023 Medical Rec #:  [id number]  Height:       62.5 in Accession #:    [id number] Weight:       270.1 lb Date of Birth:  [birth year]  BSA:          2.185 m Patient Age:    27 years   BP:           175/54 mmHg Patient Gender: F          HR:           111 bpm. Exam Location:  Inpatient Procedure: 2D Echo, Cardiac Doppler, Color Doppler and Intracardiac            Opacification Agent Indications:    Pulmonary Embolus  History:        Patient has no prior history of Echocardiogram examinations.                 Risk Factors:Hypertension.  Sonographer:    Karma Ganja Referring Phys: [id number] Henrene Hawking  Sonographer Comments: Technically difficult study due to poor echo windows and patient is obese. IMPRESSIONS  1. Left ventricular ejection fraction, by estimation, is 35 to 40%. The left ventricle has moderately decreased function. The left ventricle demonstrates global hypokinesis. There is mild left ventricular hypertrophy. Indeterminate diastolic filling due  to E-A fusion.  2. Poor RV visualization, but suspect moderate RV systolic dysfunction. Right ventricular systolic function was not well visualized. The right ventricular size is not well visualized. Tricuspid regurgitation signal is inadequate for assessing PA pressure.  3. The mitral valve is normal in structure. Trivial mitral valve regurgitation.  4. The aortic valve was not well visualized. Aortic valve regurgitation is not visualized. No aortic stenosis is present.  5. The inferior vena cava is dilated in size with <50% respiratory variability, suggesting right atrial pressure of 15 mmHg. FINDINGS  Left Ventricle: Left ventricular ejection fraction, by estimation, is 35 to 40%. The left ventricle has moderately decreased function. The left ventricle demonstrates global hypokinesis. Definity contrast agent was given IV to delineate the left ventricular endocardial borders. The left ventricular internal cavity size was normal in size. There is  mild left ventricular hypertrophy. Indeterminate diastolic filling due to E-A fusion. Right Ventricle: The right ventricular size is not well visualized. Right vetricular wall thickness was not well visualized. Right ventricular systolic function was not well visualized. Tricuspid regurgitation signal is inadequate for assessing PA pressure. Left Atrium: Left atrial size  was normal in size. Right Atrium: Right atrial size was normal in size. Pericardium: Trivial pericardial effusion is present. Mitral Valve: The mitral valve is normal in structure. Trivial mitral valve regurgitation. Tricuspid Valve: The tricuspid valve is normal in structure. Tricuspid valve regurgitation is trivial. Aortic Valve: The aortic valve was not well visualized. Aortic valve regurgitation is not visualized. No aortic stenosis is present. Aortic valve mean gradient measures 3.0 mmHg. Aortic valve peak gradient measures 6.1 mmHg. Aortic valve area, by VTI measures 2.24 cm. Pulmonic Valve: The pulmonic valve was not well visualized. Pulmonic valve regurgitation is trivial. Aorta: The aortic root and ascending aorta are structurally normal, with no evidence of dilitation. Venous: The inferior vena cava is dilated in size with less than 50% respiratory variability, suggesting right atrial pressure of 15 mmHg. IAS/Shunts: The interatrial septum was not well visualized.  LEFT VENTRICLE PLAX 2D LVIDd:         4.90 cm      Diastology LVIDs:         4.00 cm      LV e' medial:    9.46 cm/s LV PW:         1.30 cm      LV E/e' medial:  6.1 LV IVS:        1.20 cm      LV e' lateral:   7.62 cm/s LVOT diam:     2.20 cm      LV E/e' lateral: 7.5 LV SV:         40 LV SV Index:   18 LVOT Area:     3.80 cm  LV Volumes (MOD) LV vol d, MOD A2C: 112.0 ml LV vol d, MOD A4C: 84.6 ml LV vol s, MOD A2C: 68.7 ml LV vol s, MOD A4C: 52.6 ml LV SV MOD A2C:     43.3 ml LV SV MOD A4C:     84.6 ml LV SV MOD BP:      36.2 ml RIGHT VENTRICLE            IVC RV Basal diam:   3.80 cm    IVC diam: 2.10 cm RV S prime:     6.64 cm/s TAPSE (M-mode): 1.8 cm LEFT ATRIUM           Index        RIGHT ATRIUM           Index LA diam:      4.00 cm 1.83 cm/m   RA Area:     17.50 cm LA Vol (A2C): 41.0 ml 18.76 ml/m  RA Volume:   50.20 ml  22.98 ml/m LA Vol (A4C): 22.2 ml 10.16 ml/m  AORTIC VALVE AV Area (Vmax):    2.10 cm AV Area (Vmean):   2.11 cm AV Area (VTI):     2.24 cm AV Vmax:           123.00 cm/s AV Vmean:          86.800 cm/s AV VTI:            0.178 m AV Peak Grad:      6.1 mmHg AV Mean Grad:      3.0 mmHg LVOT Vmax:         68.00 cm/s LVOT Vmean:        48.100 cm/s LVOT VTI:          0.105 m LVOT/AV VTI ratio: 0.59  AORTA Ao Root diam: 3.20 cm Ao Asc  diam:  2.70 cm MITRAL VALVE MV Area (PHT): 8.43 cm    SHUNTS MV Decel Time: 90 msec     Systemic VTI:  0.10 m MV E velocity: 57.50 cm/s  Systemic Diam: 2.20 cm MV A velocity: 81.20 cm/s MV E/A ratio:  0.71 Epifanio Lesches MD Electronically signed by Epifanio Lesches MD Signature Date/Time: 07/07/2023/10:37:06 AM    Final    DG Chest Port 1 View Result Date: 07/07/2023 CLINICAL DATA:  28 year old female with history of respiratory failure. EXAM: PORTABLE CHEST 1 VIEW COMPARISON:  Chest x-ray 07/06/2023. FINDINGS: Low lung volumes with bibasilar opacities which may reflect areas of atelectasis and/or consolidation, with superimposed small bilateral pleural effusions. No pneumothorax. No evidence of pulmonary edema. Heart size is borderline enlarged, likely accentuated by low lung volumes and portable AP technique. Upper mediastinal contours are within normal limits. IMPRESSION: 1. Low lung volumes with persistent bibasilar areas of atelectasis and/or consolidation and superimposed small bilateral pleural effusions. Electronically Signed   By: Trudie Reed M.D.   On: 07/07/2023 06:52   VAS [place] LOWER EXTREMITY VENOUS (DVT) (ONLY [facility] & [facility]) Result Date: 07/06/2023  Lower Venous DVT Study Patient Name:  Amber Arroyo  Date of  Exam:   07/06/2023 Medical Rec #: [id number]  Accession #:    [id number] Date of Birth: 11-24-1995  Patient Gender: F Patient Age:   50 years Exam Location:  [facility] Procedure:      VAS [place] LOWER EXTREMITY VENOUS (DVT) Referring Phys: Jomarie Longs STEVENS --------------------------------------------------------------------------------  Indications: SOB, and Pain.  Risk Factors: Obesity. Limitations: Poor patient position (lying on right side). Comparison Study: Previous exams all negative for DVT (04/07/23, 11/19/20,                   07/03/17, 12/22/15, 12/26/15) Performing Technologist: Ernestene Mention RVT, RDMS  Examination Guidelines: A complete evaluation includes B-mode imaging, spectral Doppler, color Doppler, and power Doppler as needed of all accessible portions of each vessel. Bilateral testing is considered an integral part of a complete examination. Limited examinations for reoccurring indications may be performed as noted. The reflux portion of the exam is performed with the patient in reverse Trendelenburg.  +---------+---------------+---------+-----------+----------+-------------------+ RIGHT    CompressibilityPhasicitySpontaneityPropertiesThrombus Aging      +---------+---------------+---------+-----------+----------+-------------------+ CFV      Full           Yes      Yes                                      +---------+---------------+---------+-----------+----------+-------------------+ SFJ      Full                                                             +---------+---------------+---------+-----------+----------+-------------------+ FV Prox  Full           Yes      Yes                                      +---------+---------------+---------+-----------+----------+-------------------+ FV Mid   Full           Yes      Yes                                      +---------+---------------+---------+-----------+----------+-------------------+  FV DistalFull           Yes      Yes                                       +---------+---------------+---------+-----------+----------+-------------------+ PFV      Full                                                             +---------+---------------+---------+-----------+----------+-------------------+ POP      Full           Yes      Yes                                      +---------+---------------+---------+-----------+----------+-------------------+ PTV                                                   Not well visualized +---------+---------------+---------+-----------+----------+-------------------+ PERO                                                  Not well visualized +---------+---------------+---------+-----------+----------+-------------------+   +---------+---------------+---------+-----------+----------+------------------+ LEFT     CompressibilityPhasicitySpontaneityPropertiesThrombus Aging     +---------+---------------+---------+-----------+----------+------------------+ CFV      Full           Yes      Yes                                     +---------+---------------+---------+-----------+----------+------------------+ SFJ      Full                                                            +---------+---------------+---------+-----------+----------+------------------+ FV Prox  Full           Yes      Yes                                     +---------+---------------+---------+-----------+----------+------------------+ FV Mid   Full           Yes      Yes                                     +---------+---------------+---------+-----------+----------+------------------+ FV DistalNone           No       No                   Acute              +---------+---------------+---------+-----------+----------+------------------+  PFV                     Yes      Yes                  patent by                                                                 color/doppler      +---------+---------------+---------+-----------+----------+------------------+ POP      None           No       No                   Acute              +---------+---------------+---------+-----------+----------+------------------+ PTV                                                   Not visualized     +---------+---------------+---------+-----------+----------+------------------+ PERO                                                  Not visualized     +---------+---------------+---------+-----------+----------+------------------+ Gastroc  None           No       No                   Acute              +---------+---------------+---------+-----------+----------+------------------+     Summary: BILATERAL: -No evidence of popliteal cyst, bilaterally. RIGHT: - There is no evidence of deep vein thrombosis in the lower extremity.  LEFT: - Findings consistent with acute deep vein thrombosis involving the left femoral vein, and left popliteal vein. Findings consistent with acute intramuscular thrombosis involving the left gastrocnemius veins.  *See table(s) above for measurements and observations. Electronically signed by Carolynn Sayers on 07/06/2023 at 9:26:52 PM.    Final    CT Angio Chest PE W and/or Wo Contrast Result Date: 07/06/2023 CLINICAL DATA:  Cough and shortness of breath. Concern for pulmonary embolism. EXAM: CT ANGIOGRAPHY CHEST WITH CONTRAST TECHNIQUE: Multidetector CT imaging of the chest was performed using the standard protocol during bolus administration of intravenous contrast. Multiplanar CT image reconstructions and MIPs were obtained to evaluate the vascular anatomy. RADIATION DOSE REDUCTION: This exam was performed according to the departmental dose-optimization program which includes automated exposure control, adjustment of the mA and/or kV according to patient size and/or use of iterative reconstruction technique. CONTRAST:  60mL OMNIPAQUE  IOHEXOL 350 MG/ML SOLN COMPARISON:  Chest radiograph dated 07/06/2023. FINDINGS: Cardiovascular: There is no cardiomegaly or pericardial effusion. The thoracic aorta is unremarkable. The origins of the great vessels of the aortic arch appear patent. There is a saddle embolus straddling the bifurcation of the main pulmonary trunk. Large bilateral central pulmonary artery emboli extending into the lobar and segmental branches. There is dilatation of the right heart chambers with RV/LV ratio of  approximately 1.2 in keeping with right heart straining. Mediastinum/Nodes: Mildly enlarged right hilar lymph node. Subcarinal lymph node measures approximately 2 cm in short axis. The esophagus and the thyroid gland are grossly unremarkable. No mediastinal fluid collection. Lungs/Pleura: Large area of consolidation in the right lower lobe as well as smaller consolidation in the right middle lobe. Scattered nodular densities in the right lower lobe, right middle lobe, and left lower lobe. Findings most consistent with multi lobar pneumonia. Underlying pulmonary infarct in the right lower lobe is not excluded considering extensive pulmonary emboli. No pleural effusion or pneumothorax. The central airways are patent. Upper Abdomen: No acute abnormality. Musculoskeletal: No acute osseous pathology. Review of the MIP images confirms the above findings. IMPRESSION: 1. Positive for acute saddle PE with CT evidence of right heart strain (RV/LV Ratio = 1.2) consistent with at least submassive (intermediate risk) PE. The presence of right heart strain has been associated with an increased risk of morbidity and mortality. Please refer to the "Code PE Focused" order set in EPIC. 2. Multi lobar pneumonia. Underlying pulmonary infarct in the right lower lobe is not excluded considering extensive pulmonary emboli. These results were called by telephone at the time of interpretation on 07/06/2023 at 5:25 pm to Dr. Maris Berger, who verbally  acknowledged these results. Electronically Signed   By: Elgie Collard M.D.   On: 07/06/2023 17:29      Signature  -   Susa Raring M.D on 07/10/2023 at 8:39 AM   -  To page go to [email or web address]

## 2023-07-10 NOTE — Plan of Care (Signed)
  Problem: Education: Goal: Knowledge of General Education information will improve Description: Including pain rating scale, medication(s)/side effects and non-pharmacologic comfort measures Outcome: Progressing   Problem: Health Behavior/Discharge Planning: Goal: Ability to manage health-related needs will improve Outcome: Progressing   Problem: Clinical Measurements: Goal: Ability to maintain clinical measurements within normal limits will improve Outcome: Progressing   Problem: Clinical Measurements: Goal: Will remain free from infection Outcome: Progressing   Problem: Activity: Goal: Risk for activity intolerance will decrease Outcome: Progressing

## 2023-07-10 NOTE — Plan of Care (Signed)
  Problem: Clinical Measurements: Goal: Ability to maintain clinical measurements within normal limits will improve Outcome: Progressing   Problem: Activity: Goal: Risk for activity intolerance will decrease Outcome: Progressing   

## 2023-07-10 NOTE — Progress Notes (Signed)
Rounding Note    Patient Name: Amber Arroyo Date of Encounter: 07/10/2023  [facility] Cardiologist: New  Subjective   No CP; notes mild DOE.  Inpatient Medications    Scheduled Meds:  Chlorhexidine Gluconate Cloth  6 each Topical Daily   doxycycline  100 mg Oral Q12H   ferrous sulfate  325 mg Oral Q breakfast   folic acid  1 mg Oral Daily   ipratropium-albuterol  3 mL Nebulization BID   metoprolol succinate  50 mg Oral Daily   sacubitril-valsartan  1 tablet Oral BID   Continuous Infusions:  cefTRIAXone (ROCEPHIN)  IV 2 g (07/09/23 1909)   heparin 2,100 Units/hr (07/09/23 2204)   PRN Meds: acetaminophen, guaiFENesin-codeine, ipratropium-albuterol, ondansetron (ZOFRAN) IV, mouth rinse   Vital Signs    Vitals:   07/10/23 0046 07/10/23 0436 07/10/23 0500 07/10/23 0742  BP: (!) 131/106 (!) 147/99    Pulse: (!) 103 89    Resp: (!) 25 (!) 21    Temp: 97.9 F (36.6 C) 97.9 F (36.6 C)    TempSrc: Axillary Axillary    SpO2: 95% 91%  (!) 74%  Weight:   122.9 kg   Height:       No intake or output data in the 24 hours ending 07/10/23 0819    07/10/2023    5:00 AM 07/06/2023    5:00 PM 06/28/2023    7:14 PM  Last 3 Weights  Weight (lbs) 270 lb 15.1 oz 270 lb 1 oz 270 lb  Weight (kg) 122.9 kg 122.5 kg 122.471 kg      Telemetry    Sinus - Personally Reviewed   Physical Exam   GEN: No acute distress.  Obese Neck: No JVD Cardiac: RRR Respiratory: Clear to auscultation bilaterally. GI: Soft, nontender, non-distended  MS: No edema Neuro:  Nonfocal  Psych: Normal affect   Labs    High Sensitivity Troponin:   Recent Labs  Lab 07/06/23 1500 07/06/23 1620  TROPONINIHS 75* 243*     Chemistry Recent Labs  Lab 07/07/23 0234 07/08/23 1050 07/09/23 0624 07/10/23 0532  NA 142 140 139 137  K 3.6 3.7 4.5 4.2  CL 105 104 104 102  CO2 28 27 25 28   GLUCOSE 103* 93 86 89  BUN 9 7 8 8   CREATININE 0.63 0.72 0.65 0.56  CALCIUM 8.5* 8.5* 8.5* 8.7*  MG  2.4  --  2.3 2.2  GFRNONAA >60 >60 >60 >60  ANIONGAP 9 9 10 7     Hematology Recent Labs  Lab 07/07/23 0234 07/07/23 2330 07/10/23 0532  WBC 10.4 7.9 6.1  RBC 4.13 3.66* 3.85*  HGB 8.6* 9.5* 8.7*  HCT 28.5* 27.5* 28.0*  MCV 69.0* 75.1* 72.7*  MCH 20.8* 26.0 22.6*  MCHC 30.2 34.5 31.1  RDW 21.3* 23.5* 22.7*  PLT 274 305 331   Thyroid  Recent Labs  Lab 07/09/23 0624 07/09/23 1139  TSH 5.990*  --   FREET4  --  0.89    BNP Recent Labs  Lab 07/06/23 0530  BNP 56.7     Radiology    DG Chest Port 1 View Result Date: 07/09/2023 CLINICAL DATA:  Short of breath.  Follow-up exam. EXAM: PORTABLE CHEST 1 VIEW COMPARISON:  07/07/2023.  CT, 07/06/2023. FINDINGS: Right greater than left lung base opacities are without significant change from the prior chest radiograph allowing for differences in patient positioning and lung volumes. No new lung abnormalities. Cardiac silhouette normal in size. No pneumothorax. IMPRESSION: 1.  No significant change from the most recent prior exam. Right greater than left lung base opacities consistent with pneumonia and/or atelectasis. Electronically Signed   By: Amie Portland M.D.   On: 07/09/2023 07:50     Patient Profile     28 year old female with past medical history of asthma, obesity, hypertension admitted with pulmonary embolus for evaluation of cardiomyopathy and pulmonary embolus.   Lower extremity venous Dopplers showed acute DVT in the left femoral vein and left gastrocnemius vein.  Echocardiogram showed ejection fraction 35 to 40%, mild left ventricular hypertrophy, probable moderate RV dysfunction.  CTA showed acute saddle pulmonary embolus with CT evidence of right heart strain and possible multi lobar pneumonia.    Assessment & Plan    1 cardiomyopathy-etiology unclear.  No history of alcohol use.  Question hypertensive mediated.  Continue Toprol and Entresto.  Add Farxiga 10 mg daily and spironolactone 12.5 mg daily.  Titrate  medications to blood pressure.  Repeat echocardiogram in 3 months. If ejection fraction remains decreased would need further evaluation including cardiac MRI.  Doubt ischemia given patient's age.   2 pulmonary embolus-this is felt to be provoked with decreased mobility over the preceding week due to upper respiratory infection, obesity and reduced LV function.  Also on norethindrone at home.  Will discontinue IV heparin and treat with apixaban 10 mg twice daily for 7 days then 5 mg daily thereafter. Duration of anticoagulation to be determined.  If LV function does not improve may need lifelong anticoagulation.  Note her pulmonary embolus was a saddle embolus with RV strain and required half dose thrombolytic therapy.  Also note there has been no recent travel or surgical procedures.   3 microcytic anemia-likely secondary to menstruation.  Will need to have hemoglobin followed closely with addition of anticoagulation.   4 hypertension-blood pressure remains elevated.  Continue Toprol, Entresto and add Farxiga/spironolactone.  Follow blood pressure and advance medications as needed.  5 snoring-patient needs outpatient sleep study.  For questions or updates, please contact [facility] Please consult [email or web address] for contact info under        Signed, Olga Millers, MD  07/10/2023, 8:19 AM

## 2023-07-10 NOTE — Progress Notes (Signed)
Patient is alert, awake, resting, relaxing , HR 101, 104. PT is on heparin drip @ 2mls/hr. PTT will be checking at 0500. Bilateral lower extremity mild edema, elevated on pillow.

## 2023-07-10 NOTE — Discharge Instructions (Signed)
Information on my medicine - ELIQUIS (apixaban)  This medication education was reviewed with me or my healthcare representative as part of my discharge preparation.  Why was Eliquis prescribed for you? Eliquis was prescribed to treat blood clots that may have been found in the veins of your legs (deep vein thrombosis) or in your lungs (pulmonary embolism) and to reduce the risk of them occurring again.  What do You need to know about Eliquis ? The starting dose is 10 mg (two 5 mg tablets) taken TWICE daily for the FIRST SEVEN (7) DAYS, then the dose is reduced to ONE 5 mg tablet taken TWICE daily.  Eliquis may be taken with or without food.   Try to take the dose about the same time in the morning and in the evening. If you have difficulty swallowing the tablet whole please discuss with your pharmacist how to take the medication safely.  Take Eliquis exactly as prescribed and DO NOT stop taking Eliquis without talking to the doctor who prescribed the medication.  Stopping may increase your risk of developing a new blood clot.  Refill your prescription before you run out.  After discharge, you should have regular check-up appointments with your healthcare provider that is prescribing your Eliquis.    What do you do if you miss a dose? If a dose of ELIQUIS is not taken at the scheduled time, take it as soon as possible on the same day and twice-daily administration should be resumed. The dose should not be doubled to make up for a missed dose.  Important Safety Information A possible side effect of Eliquis is bleeding. You should call your healthcare provider right away if you experience any of the following: Bleeding from an injury or your nose that does not stop. Unusual colored urine (red or dark brown) or unusual colored stools (red or black). Unusual bruising for unknown reasons. A serious fall or if you hit your head (even if there is no bleeding).  Some medicines may interact  with Eliquis and might increase your risk of bleeding or clotting while on Eliquis. To help avoid this, consult your healthcare provider or pharmacist prior to using any new prescription or non-prescription medications, including herbals, vitamins, non-steroidal anti-inflammatory drugs (NSAIDs) and supplements.  This website has more information on Eliquis (apixaban): http://www.eliquis.com/eliquis/home       Follow with Primary MD Kathreen Cornfield, PA-C in 7 days   Get CBC, CMP,  -  checked next visit with your primary MD    Activity: As tolerated with Full fall precautions use walker/cane & assistance as needed  Disposition Home   Diet: Heart Healthy    Special Instructions: If you have smoked or chewed Tobacco  in the last 2 yrs please stop smoking, stop any regular Alcohol  and or any Recreational drug use.  On your next visit with your primary care physician please Get Medicines reviewed and adjusted.  Please request your Prim.MD to go over all Hospital Tests and Procedure/Radiological results at the follow up, please get all Hospital records sent to your Prim MD by signing hospital release before you go home.  If you experience worsening of your admission symptoms, develop shortness of breath, life threatening emergency, suicidal or homicidal thoughts you must seek medical attention immediately by calling 911 or calling your MD immediately  if symptoms less severe.  You Must read complete instructions/literature along with all the possible adverse reactions/side effects for all the Medicines you take and that have been  prescribed to you. Take any new Medicines after you have completely understood and accpet all the possible adverse reactions/side effects.   Do not drive when taking Pain medications.  Do not take more than prescribed Pain, Sleep and Anxiety Medications  Wear Seat belts while driving.

## 2023-07-10 NOTE — Progress Notes (Signed)
Mobility Specialist: Progress Note   07/10/23 1559  Mobility  Activity Ambulated with assistance in hallway  Level of Assistance Contact guard assist, steadying assist  Assistive Device Front wheel walker  Distance Ambulated (ft) 160 ft  Activity Response Tolerated well  Mobility Referral Yes  Mobility visit 1 Mobility  Mobility Specialist Start Time (ACUTE ONLY) 1436  Mobility Specialist Stop Time (ACUTE ONLY) 1457  Mobility Specialist Time Calculation (min) (ACUTE ONLY) 21 min    Pre-Mobility: HR 101, SpO2 96-97% 4LO2 During Mobility: HR 111-123, SpO2 85-96% 4LO2 Post-Mobility: HR 101-103, SpO2 96% 4LO2  Pt was agreeable to mobility session - received in bed. Feeling better than this morning, no c/o of leg pain. CG throughout. SpO2 desat a couple of times but pt was able to recover to SpO2 WFL after a standing break. Returned to room without fault. Left in bed with all needs met, call bell in reach.   Maurene Capes Mobility Specialist Please contact via SecureChat or Rehab office at [phone]

## 2023-07-10 NOTE — Progress Notes (Signed)
PHARMACY - ANTICOAGULATION CONSULT NOTE  Pharmacy Consult for heparin to eliquis Indication: pulmonary embolus and DVT LLE  Allergies  Allergen Reactions   Fish-Derived Products Shortness Of Breath, Swelling and Other (See Comments)    Lips swell   Shellfish-Derived Products Shortness Of Breath, Swelling and Other (See Comments)    Lips swell   Tree Extract Other (See Comments)    Patient was told she is allergic to trees    Patient Measurements: Height: 5' 2.5" (158.8 cm) Weight: 122.9 kg (270 lb 15.1 oz) IBW/kg (Calculated) : 51.25 Heparin Dosing Weight: 82 kg  Vital Signs: Temp: 97.9 F (36.6 C) (01/18 0436) Temp Source: Axillary (01/18 0436) BP: 147/99 (01/18 0436) Pulse Rate: 89 (01/18 0436)  Labs: Recent Labs    07/07/23 2330 07/08/23 0312 07/08/23 1050 07/08/23 1947 07/09/23 0624 07/09/23 1800 07/10/23 0532  HGB 9.5*  --   --   --   --   --  8.7*  HCT 27.5*  --   --   --   --   --  28.0*  PLT 305  --   --   --   --   --  331  HEPARINUNFRC 0.42   < >  --    < > 0.41 0.46 0.60  CREATININE  --   --  0.72  --  0.65  --  0.56   < > = values in this interval not displayed.    Estimated Creatinine Clearance: 133.2 mL/min (by C-G formula based on SCr of 0.56 mg/dL).  Assessment: 29 YOF presenting with R leg pain who became SOB. CTA found to be positive for acute saddle PE with evidence of R heart strain. Not on [facility] prior to admission. Pharmacy has been consulted to dose heparin. Prior to heparin gtt initiation decision to start tPA 50mg  over 2h (given 1/14 2042), and s/p infusion and to start heparin gtt with aPTT <80. Duplex showed LLE DVT.   1/18: Heparin level therapeutic (0.60) on 2100 units/hr. No bleeding noted. MD opts to switch to eliquis today 1/18 for anticipated discharge tomorrow 1/19. Will utilize [facility] pharmacy.  Will opt for full Eliquis load given PE with R heart strain and multiple risk factors for hypercoagulable state and LLE DVT.   Goal of Therapy:   Heparin level 0.3-0.7 units/ml Monitor platelets by anticoagulation protocol: Yes   Plan:  Stop heparin infusion and give the first dose of eliquis at the same time heparin is stopped - timed for 10:00am 1/18  Start Eliquis 10mg  BID x 7 days then transition to eliquis 5mg  BID   Blane Ohara, PharmD, BCPS PGY2 Pharmacy Resident

## 2023-07-11 DIAGNOSIS — I2609 Other pulmonary embolism with acute cor pulmonale: Secondary | ICD-10-CM | POA: Diagnosis not present

## 2023-07-11 DIAGNOSIS — I420 Dilated cardiomyopathy: Secondary | ICD-10-CM | POA: Diagnosis not present

## 2023-07-11 LAB — RAPID URINE DRUG SCREEN, HOSP PERFORMED
Amphetamines: NOT DETECTED
Barbiturates: NOT DETECTED
Benzodiazepines: NOT DETECTED
Cocaine: NOT DETECTED
Opiates: POSITIVE — AB
Tetrahydrocannabinol: NOT DETECTED

## 2023-07-11 NOTE — Discharge Summary (Addendum)
Amber Arroyo PIR:[id number] DOB: 1996-03-15 DOA: 07/06/2023  PCP: Kathreen Cornfield, PA-C  Admit date: 07/06/2023  Discharge date: 07/11/2023  Admitted From: Home   Disposition:  Home   Recommendations for Outpatient Follow-up:   Follow up with PCP in 1-2 weeks  PCP Please obtain BMP/CBC, 2 view CXR in 1week,  (see Discharge instructions)   PCP Please follow up on the following pending results:    Home Health: None   Equipment/Devices: o2  Consultations: PCCM, Cards Discharge Condition: Stable    CODE STATUS: Full    Diet Recommendation: Heart Healthy     Chief Complaint  Patient presents with   Leg Swelling          Brief history of present illness from the day of admission and additional interim summary    28 year old woman with past medical history of asthma on as needed albuterol who presents with shortness of breath. Initially was diagnosed with URI at an urgent care, symptoms did not get better came to the ER where she was diagnosed with saddle PE admitted to pulmonary critical care started on heparin drip transferred to my service on 07/09/2023. Echo by the way showed a EF of around 35%.                                                                  Hospital Course   Acute hypoxic respiratory failure due to saddle PE, left lower extremity DVT with evidence of right heart strain on echocardiogram.  Was placed on heparin drip, much improved clinically on 07/10/2023 transitioned to Eliquis.  She is morbidly obese and takes oral contraceptive pills which could be contributing to her hypercoagulable state.  Considering her age will have her still follow-up with pulmonary in 1 to 2 months postdischarge, for now since her clot is life-threatening minimum treatment is 9 months thereafter per pulmonary.  She will  also undergo ambulatory pulse ox eval and if she qualifies she will get home oxygen, she will also benefit from outpatient sleep study.'s This morning she is symptom-free and eager to go home.   Chronic iron deficiency anemia.  Supplement oral iron, outpatient OB and PCP follow-up for age-appropriate anemia workup.   Chronic systolic heart failure EF 35%.  Unclear etiology, etiology on board, treat PE, treat blood pressure repeat echocardiogram in 3 months, discussed with cardiologist placed on appropriate cardiac medications plan is to repeat echocardiogram in 3 months, will follow-up with cardiology postdischarge.   Obesity.  BMI 35.  Follow-up with PCP.  Will benefit from outpatient sleep study.   Hypertension.  Placed on beta-blocker.    Discharge diagnosis     Principal Problem:   Pulmonary embolus [facility])    Discharge instructions    Discharge Instructions     Diet - low sodium  heart healthy   Complete by: As directed    Discharge instructions   Complete by: As directed    Follow with Primary MD Kathreen Cornfield, PA-C in 7 days   Get CBC, CMP,  -  checked next visit with your primary MD    Activity: As tolerated with Full fall precautions use walker/cane & assistance as needed  Disposition Home   Diet: Heart Healthy    Special Instructions: If you have smoked or chewed Tobacco  in the last 2 yrs please stop smoking, stop any regular Alcohol  and or any Recreational drug use.  On your next visit with your primary care physician please Get Medicines reviewed and adjusted.  Please request your Prim.MD to go over all Hospital Tests and Procedure/Radiological results at the follow up, please get all Hospital records sent to your Prim MD by signing hospital release before you go home.  If you experience worsening of your admission symptoms, develop shortness of breath, life threatening emergency, suicidal or homicidal thoughts you must seek medical attention immediately by  calling 911 or calling your MD immediately  if symptoms less severe.  You Must read complete instructions/literature along with all the possible adverse reactions/side effects for all the Medicines you take and that have been prescribed to you. Take any new Medicines after you have completely understood and accpet all the possible adverse reactions/side effects.   Do not drive when taking Pain medications.  Do not take more than prescribed Pain, Sleep and Anxiety Medications  Wear Seat belts while driving.   Increase activity slowly   Complete by: As directed        Discharge Medications   Allergies as of 07/11/2023       Reactions   Fish-derived Products Shortness Of Breath, Swelling, Other (See Comments)   Lips swell   Shellfish-derived Products Shortness Of Breath, Swelling, Other (See Comments)   Lips swell   Tree Extract Other (See Comments)   Patient was told she is allergic to trees        Medication List     STOP taking these medications    amoxicillin 875 MG tablet Commonly known as: AMOXIL   hydrochlorothiazide 25 MG tablet Commonly known as: HYDRODIURIL   norethindrone 5 MG tablet Commonly known as: AYGESTIN   predniSONE 20 MG tablet Commonly known as: DELTASONE   promethazine-dextromethorphan 6.25-15 MG/5ML syrup Commonly known as: PROMETHAZINE-DM       TAKE these medications    acetaminophen 500 MG tablet Commonly known as: TYLENOL Take 1,000 mg by mouth every 6 (six) hours as needed for mild pain (pain score 1-3) or moderate pain (pain score 4-6).   albuterol 108 (90 Base) MCG/ACT inhaler Commonly known as: VENTOLIN HFA Inhale 2 puffs into the lungs every 4 (four) hours as needed for wheezing or shortness of breath.   apixaban 5 MG Tabs tablet Commonly known as: ELIQUIS Take 2 tablets (10 mg total) by mouth 2 (two) times daily for 6 days, THEN 1 tablet (5 mg total) 2 (two) times daily. Start taking on: July 10, 2023   empagliflozin 10  MG Tabs tablet Commonly known as: Jardiance Take 1 tablet (10 mg total) by mouth daily.   EPINEPHrine 0.3 mg/0.3 mL Soaj injection Commonly known as: EPI-PEN Inject 0.3 mg into the muscle See admin instructions. What changed:  when to take this reasons to take this   FeroSul 325 (65 Fe) MG tablet Generic drug: ferrous sulfate Take 325 mg by mouth  daily.   folic acid 1 MG tablet Commonly known as: FOLVITE Take 1 tablet (1 mg total) by mouth daily.   hydrOXYzine 25 MG tablet Commonly known as: ATARAX Take 25 mg by mouth as needed for anxiety.   metoprolol succinate 50 MG 24 hr tablet Commonly known as: TOPROL-XL Take 1 tablet (50 mg total) by mouth daily. Take with or immediately following a meal.   ondansetron 4 MG disintegrating tablet Commonly known as: ZOFRAN-ODT Take 1 tablet (4 mg total) by mouth every 8 (eight) hours as needed.   sacubitril-valsartan 24-26 MG Commonly known as: ENTRESTO Take 1 tablet by mouth 2 (two) times daily.   spironolactone 25 MG tablet Commonly known as: ALDACTONE Take 0.5 tablets (12.5 mg total) by mouth daily.   triamcinolone cream 0.5 % Commonly known as: KENALOG Apply 1 Application topically 2 (two) times daily.               Durable Medical Equipment  (From admission, onward)           Start     Ordered   07/11/23 0954  For home use only DME oxygen  Once       Question Answer Comment  Length of Need 6 Months   Mode or (Route) Nasal cannula   Liters per Minute 3   Frequency Continuous (stationary and portable oxygen unit needed)   Oxygen conserving device Yes   Oxygen delivery system Gas      07/11/23 0953             Follow-up Information     [facility]. Go in 7 day(s).   Specialty: Cardiology Why: Hospital follow up 07/19/23 @ 10 am PLEASE bring a current medication list to appointment FREE valet parking, Entrance C, off [employer]  information: [address] [place] [address] [phone]        Kathreen Cornfield, [place]. Schedule an appointment as soon as possible for a visit in 1 week(s).   Specialty: Physician Assistant Contact information: [address] [place] [address] [phone]         Hunsucker, Lesia Sago, MD. Schedule an appointment as soon as possible for a visit in 1 month(s).   Specialty: Pulmonary Disease Why: PE Contact information: [address] [place] [address] [phone]                 Major procedures and Radiology Reports - PLEASE review detailed and final reports thoroughly  -        DG Chest [facility] 1 View Result Date: 07/09/2023 CLINICAL DATA:  Short of breath.  Follow-up exam. EXAM: PORTABLE CHEST 1 VIEW COMPARISON:  07/07/2023.  CT, 07/06/2023. FINDINGS: Right greater than left lung base opacities are without significant change from the prior chest radiograph allowing for differences in patient positioning and lung volumes. No new lung abnormalities. Cardiac silhouette normal in size. No pneumothorax. IMPRESSION: 1. No significant change from the most recent prior exam. Right greater than left lung base opacities consistent with pneumonia and/or atelectasis. Electronically Signed   By: Amie Portland M.D.   On: 07/09/2023 07:50   ECHOCARDIOGRAM COMPLETE Result Date: 07/07/2023    ECHOCARDIOGRAM REPORT   Patient Name:   Amber Arroyo  Date of Exam: 07/07/2023 Medical Rec #:  [id number]  Height:       62.5 in Accession #:    [id number] Weight:  270.1 lb Date of Birth:  [birth year]  BSA:          2.185 m Patient Age:    27 years   BP:           175/54 mmHg Patient Gender: F          HR:           111 bpm. Exam Location:  Inpatient Procedure: 2D Echo, Cardiac Doppler, Color Doppler and Intracardiac            Opacification Agent Indications:    Pulmonary Embolus  History:        Patient has no prior history of Echocardiogram  examinations.                 Risk Factors:Hypertension.  Sonographer:    Karma Ganja Referring Phys: [id number] Henrene Hawking  Sonographer Comments: Technically difficult study due to poor echo windows and patient is obese. IMPRESSIONS  1. Left ventricular ejection fraction, by estimation, is 35 to 40%. The left ventricle has moderately decreased function. The left ventricle demonstrates global hypokinesis. There is mild left ventricular hypertrophy. Indeterminate diastolic filling due  to E-A fusion.  2. Poor RV visualization, but suspect moderate RV systolic dysfunction. Right ventricular systolic function was not well visualized. The right ventricular size is not well visualized. Tricuspid regurgitation signal is inadequate for assessing PA pressure.  3. The mitral valve is normal in structure. Trivial mitral valve regurgitation.  4. The aortic valve was not well visualized. Aortic valve regurgitation is not visualized. No aortic stenosis is present.  5. The inferior vena cava is dilated in size with <50% respiratory variability, suggesting right atrial pressure of 15 mmHg. FINDINGS  Left Ventricle: Left ventricular ejection fraction, by estimation, is 35 to 40%. The left ventricle has moderately decreased function. The left ventricle demonstrates global hypokinesis. Definity contrast agent was given IV to delineate the left ventricular endocardial borders. The left ventricular internal cavity size was normal in size. There is mild left ventricular hypertrophy. Indeterminate diastolic filling due to E-A fusion. Right Ventricle: The right ventricular size is not well visualized. Right vetricular wall thickness was not well visualized. Right ventricular systolic function was not well visualized. Tricuspid regurgitation signal is inadequate for assessing PA pressure. Left Atrium: Left atrial size was normal in size. Right Atrium: Right atrial size was normal in size. Pericardium: Trivial pericardial effusion is  present. Mitral Valve: The mitral valve is normal in structure. Trivial mitral valve regurgitation. Tricuspid Valve: The tricuspid valve is normal in structure. Tricuspid valve regurgitation is trivial. Aortic Valve: The aortic valve was not well visualized. Aortic valve regurgitation is not visualized. No aortic stenosis is present. Aortic valve mean gradient measures 3.0 mmHg. Aortic valve peak gradient measures 6.1 mmHg. Aortic valve area, by VTI measures 2.24 cm. Pulmonic Valve: The pulmonic valve was not well visualized. Pulmonic valve regurgitation is trivial. Aorta: The aortic root and ascending aorta are structurally normal, with no evidence of dilitation. Venous: The inferior vena cava is dilated in size with less than 50% respiratory variability, suggesting right atrial pressure of 15 mmHg. IAS/Shunts: The interatrial septum was not well visualized.  LEFT VENTRICLE PLAX 2D LVIDd:         4.90 cm      Diastology LVIDs:         4.00 cm      LV e' medial:    9.46 cm/s LV PW:  1.30 cm      LV E/e' medial:  6.1 LV IVS:        1.20 cm      LV e' lateral:   7.62 cm/s LVOT diam:     2.20 cm      LV E/e' lateral: 7.5 LV SV:         40 LV SV Index:   18 LVOT Area:     3.80 cm  LV Volumes (MOD) LV vol d, MOD A2C: 112.0 ml LV vol d, MOD A4C: 84.6 ml LV vol s, MOD A2C: 68.7 ml LV vol s, MOD A4C: 52.6 ml LV SV MOD A2C:     43.3 ml LV SV MOD A4C:     84.6 ml LV SV MOD BP:      36.2 ml RIGHT VENTRICLE            IVC RV Basal diam:  3.80 cm    IVC diam: 2.10 cm RV S prime:     6.64 cm/s TAPSE (M-mode): 1.8 cm LEFT ATRIUM           Index        RIGHT ATRIUM           Index LA diam:      4.00 cm 1.83 cm/m   RA Area:     17.50 cm LA Vol (A2C): 41.0 ml 18.76 ml/m  RA Volume:   50.20 ml  22.98 ml/m LA Vol (A4C): 22.2 ml 10.16 ml/m  AORTIC VALVE AV Area (Vmax):    2.10 cm AV Area (Vmean):   2.11 cm AV Area (VTI):     2.24 cm AV Vmax:           123.00 cm/s AV Vmean:          86.800 cm/s AV VTI:            0.178 m  AV Peak Grad:      6.1 mmHg AV Mean Grad:      3.0 mmHg LVOT Vmax:         68.00 cm/s LVOT Vmean:        48.100 cm/s LVOT VTI:          0.105 m LVOT/AV VTI ratio: 0.59  AORTA Ao Root diam: 3.20 cm Ao Asc diam:  2.70 cm MITRAL VALVE MV Area (PHT): 8.43 cm    SHUNTS MV Decel Time: 90 msec     Systemic VTI:  0.10 m MV E velocity: 57.50 cm/s  Systemic Diam: 2.20 cm MV A velocity: 81.20 cm/s MV E/A ratio:  0.71 Epifanio Lesches MD Electronically signed by Epifanio Lesches MD Signature Date/Time: 07/07/2023/10:37:06 AM    Final    DG Chest Port 1 View Result Date: 07/07/2023 CLINICAL DATA:  28 year old female with history of respiratory failure. EXAM: PORTABLE CHEST 1 VIEW COMPARISON:  Chest x-ray 07/06/2023. FINDINGS: Low lung volumes with bibasilar opacities which may reflect areas of atelectasis and/or consolidation, with superimposed small bilateral pleural effusions. No pneumothorax. No evidence of pulmonary edema. Heart size is borderline enlarged, likely accentuated by low lung volumes and portable AP technique. Upper mediastinal contours are within normal limits. IMPRESSION: 1. Low lung volumes with persistent bibasilar areas of atelectasis and/or consolidation and superimposed small bilateral pleural effusions. Electronically Signed   By: Trudie Reed M.D.   On: 07/07/2023 06:52   VAS [place] LOWER EXTREMITY VENOUS (DVT) (ONLY [facility] & [facility]) Result Date: 07/06/2023  Lower Venous DVT Study Patient Name:  Amber Arroyo  Date of Exam:   07/06/2023 Medical Rec #: [id number]  Accession #:    [id number] Date of Birth: 1996/05/22  Patient Gender: F Patient Age:   59 years Exam Location:  [facility] Procedure:      VAS [place] LOWER EXTREMITY VENOUS (DVT) Referring Phys: Jomarie Longs STEVENS --------------------------------------------------------------------------------  Indications: SOB, and Pain.  Risk Factors: Obesity. Limitations: Poor patient position (lying on right side). Comparison Study: Previous exams all negative for DVT  (04/07/23, 11/19/20,                   07/03/17, 12/22/15, 12/26/15) Performing Technologist: Ernestene Mention RVT, RDMS  Examination Guidelines: A complete evaluation includes B-mode imaging, spectral Doppler, color Doppler, and power Doppler as needed of all accessible portions of each vessel. Bilateral testing is considered an integral part of a complete examination. Limited examinations for reoccurring indications may be performed as noted. The reflux portion of the exam is performed with the patient in reverse Trendelenburg.  +---------+---------------+---------+-----------+----------+-------------------+ RIGHT    CompressibilityPhasicitySpontaneityPropertiesThrombus Aging      +---------+---------------+---------+-----------+----------+-------------------+ CFV      Full           Yes      Yes                                      +---------+---------------+---------+-----------+----------+-------------------+ SFJ      Full                                                             +---------+---------------+---------+-----------+----------+-------------------+ FV Prox  Full           Yes      Yes                                      +---------+---------------+---------+-----------+----------+-------------------+ FV Mid   Full           Yes      Yes                                      +---------+---------------+---------+-----------+----------+-------------------+ FV DistalFull           Yes      Yes                                      +---------+---------------+---------+-----------+----------+-------------------+ PFV      Full                                                             +---------+---------------+---------+-----------+----------+-------------------+ POP      Full           Yes      Yes                                      +---------+---------------+---------+-----------+----------+-------------------+  PTV                                                    Not well visualized +---------+---------------+---------+-----------+----------+-------------------+ PERO                                                  Not well visualized +---------+---------------+---------+-----------+----------+-------------------+   +---------+---------------+---------+-----------+----------+------------------+ LEFT     CompressibilityPhasicitySpontaneityPropertiesThrombus Aging     +---------+---------------+---------+-----------+----------+------------------+ CFV      Full           Yes      Yes                                     +---------+---------------+---------+-----------+----------+------------------+ SFJ      Full                                                            +---------+---------------+---------+-----------+----------+------------------+ FV Prox  Full           Yes      Yes                                     +---------+---------------+---------+-----------+----------+------------------+ FV Mid   Full           Yes      Yes                                     +---------+---------------+---------+-----------+----------+------------------+ FV DistalNone           No       No                   Acute              +---------+---------------+---------+-----------+----------+------------------+ PFV                     Yes      Yes                  patent by                                                                color/doppler      +---------+---------------+---------+-----------+----------+------------------+ POP      None           No       No                   Acute              +---------+---------------+---------+-----------+----------+------------------+ PTV  Not visualized     +---------+---------------+---------+-----------+----------+------------------+ PERO                                                  Not  visualized     +---------+---------------+---------+-----------+----------+------------------+ Gastroc  None           No       No                   Acute              +---------+---------------+---------+-----------+----------+------------------+     Summary: BILATERAL: -No evidence of popliteal cyst, bilaterally. RIGHT: - There is no evidence of deep vein thrombosis in the lower extremity.  LEFT: - Findings consistent with acute deep vein thrombosis involving the left femoral vein, and left popliteal vein. Findings consistent with acute intramuscular thrombosis involving the left gastrocnemius veins.  *See table(s) above for measurements and observations. Electronically signed by Carolynn Sayers on 07/06/2023 at 9:26:52 PM.    Final    CT Angio Chest PE W and/or Wo Contrast Result Date: 07/06/2023 CLINICAL DATA:  Cough and shortness of breath. Concern for pulmonary embolism. EXAM: CT ANGIOGRAPHY CHEST WITH CONTRAST TECHNIQUE: Multidetector CT imaging of the chest was performed using the standard protocol during bolus administration of intravenous contrast. Multiplanar CT image reconstructions and MIPs were obtained to evaluate the vascular anatomy. RADIATION DOSE REDUCTION: This exam was performed according to the departmental dose-optimization program which includes automated exposure control, adjustment of the mA and/or kV according to patient size and/or use of iterative reconstruction technique. CONTRAST:  60mL OMNIPAQUE IOHEXOL 350 MG/ML SOLN COMPARISON:  Chest radiograph dated 07/06/2023. FINDINGS: Cardiovascular: There is no cardiomegaly or pericardial effusion. The thoracic aorta is unremarkable. The origins of the great vessels of the aortic arch appear patent. There is a saddle embolus straddling the bifurcation of the main pulmonary trunk. Large bilateral central pulmonary artery emboli extending into the lobar and segmental branches. There is dilatation of the right heart chambers with RV/LV  ratio of approximately 1.2 in keeping with right heart straining. Mediastinum/Nodes: Mildly enlarged right hilar lymph node. Subcarinal lymph node measures approximately 2 cm in short axis. The esophagus and the thyroid gland are grossly unremarkable. No mediastinal fluid collection. Lungs/Pleura: Large area of consolidation in the right lower lobe as well as smaller consolidation in the right middle lobe. Scattered nodular densities in the right lower lobe, right middle lobe, and left lower lobe. Findings most consistent with multi lobar pneumonia. Underlying pulmonary infarct in the right lower lobe is not excluded considering extensive pulmonary emboli. No pleural effusion or pneumothorax. The central airways are patent. Upper Abdomen: No acute abnormality. Musculoskeletal: No acute osseous pathology. Review of the MIP images confirms the above findings. IMPRESSION: 1. Positive for acute saddle PE with CT evidence of right heart strain (RV/LV Ratio = 1.2) consistent with at least submassive (intermediate risk) PE. The presence of right heart strain has been associated with an increased risk of morbidity and mortality. Please refer to the "Code PE Focused" order set in EPIC. 2. Multi lobar pneumonia. Underlying pulmonary infarct in the right lower lobe is not excluded considering extensive pulmonary emboli. These results were called by telephone at the time of interpretation on 07/06/2023 at 5:25 pm to Dr. Maris Berger, who verbally acknowledged these results. Electronically Signed   By:  Elgie Collard M.D.   On: 07/06/2023 17:29   DG Chest 2 View Result Date: 07/06/2023 CLINICAL DATA:  28 year old female with history of cough and shortness of breath. EXAM: CHEST - 2 VIEW COMPARISON:  Chest x-ray 09/18/2020. FINDINGS: Lung volumes are normal. Bibasilar opacities which may reflect areas of atelectasis and/or consolidation (right-greater-than-left). Small right pleural effusion. No definite left pleural effusion. No  pneumothorax. No evidence of pulmonary edema. Heart size is upper limits of normal. Upper mediastinal contours are within normal limits. IMPRESSION: 1. Bibasilar opacities, most evident in the right lower lobe, which may reflect areas of atelectasis and/or consolidation. Small right pleural effusion. Followup PA and lateral chest X-ray is recommended in 3-4 weeks following trial of antibiotic therapy to ensure resolution. Electronically Signed   By: Trudie Reed M.D.   On: 07/06/2023 06:15    Micro Results    Recent Results (from the past 240 hours)  Resp panel by RT-PCR (RSV, Flu A&B, Covid) Anterior Nasal Swab     Status: None   Collection Time: 07/06/23  1:17 AM   Specimen: Anterior Nasal Swab  Result Value Ref Range Status   SARS Coronavirus 2 by RT PCR NEGATIVE NEGATIVE Final   Influenza A by PCR NEGATIVE NEGATIVE Final   Influenza B by PCR NEGATIVE NEGATIVE Final    Comment: (NOTE) The Xpert Xpress SARS-CoV-2/FLU/RSV plus assay is intended as an aid in the diagnosis of influenza from Nasopharyngeal swab specimens and should not be used as a sole basis for treatment. Nasal washings and aspirates are unacceptable for Xpert Xpress SARS-CoV-2/FLU/RSV testing.  Fact Sheet for Patients: BloggerCourse.com  Fact Sheet for Healthcare Providers: SeriousBroker.it  This test is not yet approved or cleared by the [place] FDA and has been authorized for detection and/or diagnosis of SARS-CoV-2 by FDA under an Emergency Use Authorization (EUA). This EUA will remain in effect (meaning this test can be used) for the duration of the COVID-19 declaration under Section 564(b)(1) of the Act, 21 U.S.C. section 360bbb-3(b)(1), unless the authorization is terminated or revoked.     Resp Syncytial Virus by PCR NEGATIVE NEGATIVE Final    Comment: (NOTE) Fact Sheet for Patients: BloggerCourse.com  Fact Sheet for  Healthcare Providers: SeriousBroker.it  This test is not yet approved or cleared by the [place] FDA and has been authorized for detection and/or diagnosis of SARS-CoV-2 by FDA under an Emergency Use Authorization (EUA). This EUA will remain in effect (meaning this test can be used) for the duration of the COVID-19 declaration under Section 564(b)(1) of the Act, 21 U.S.C. section 360bbb-3(b)(1), unless the authorization is terminated or revoked.  Performed at [facility], [address], [place], [place] [address]   MRSA Next Gen by PCR, Nasal     Status: None   Collection Time: 07/06/23  6:29 PM  Result Value Ref Range Status   MRSA by PCR Next Gen NOT DETECTED NOT DETECTED Final    Comment: (NOTE) The GeneXpert MRSA Assay (FDA approved for NASAL specimens only), is one component of a comprehensive MRSA colonization surveillance program. It is not intended to diagnose MRSA infection nor to guide or monitor treatment for MRSA infections. Test performance is not FDA approved in patients less than 27 years old. Performed at [facility], [address], [place], [place] [address]     Today   Subjective    Amber Arroyo today has no headache,no chest abdominal pain,no new weakness tingling or numbness, feels  much better wants to go home today.    Objective   Blood pressure (!) 114/96, pulse 90, temperature 98 F (36.7 C), temperature source Oral, resp. rate 19, height 5' 2.5" (1.588 m), weight 121 kg, last menstrual period 06/23/2023, SpO2 94%.   Intake/Output Summary (Last 24 hours) at 07/11/2023 0953 Last data filed at 07/11/2023 0350 Gross per 24 hour  Intake --  Output 1200 ml  Net -1200 ml    Exam  Awake Alert, No new F.N deficits,    [place].AT,PERRAL Supple Neck,   Symmetrical Chest wall movement, Good air movement bilaterally, CTAB RRR,No Gallops,   +ve B.Sounds, Abd Soft, Non tender,  No Cyanosis, Clubbing or edema     Data Review   Recent Labs  Lab 07/06/23 0530 07/07/23 0234 07/07/23 2330 07/10/23 0532  WBC 8.9 10.4 7.9 6.1  HGB 9.2* 8.6* 9.5* 8.7*  HCT 29.9* 28.5* 27.5* 28.0*  PLT 313 274 305 331  MCV 71.2* 69.0* 75.1* 72.7*  MCH 21.9* 20.8* 26.0 22.6*  MCHC 30.8 30.2 34.5 31.1  RDW 22.5* 21.3* 23.5* 22.7*  LYMPHSABS 1.3  --   --   --   MONOABS 0.5  --   --   --   EOSABS 0.2  --   --   --   BASOSABS 0.0  --   --   --     Recent Labs  Lab 07/06/23 0530 07/06/23 0535 07/06/23 0825 07/06/23 2219 07/06/23 2353 07/07/23 0234 07/08/23 1050 07/09/23 0624 07/10/23 0532  NA 142  --   --   --   --  142 140 139 137  K 3.5  --   --   --   --  3.6 3.7 4.5 4.2  CL 103  --   --   --   --  105 104 104 102  CO2 28  --   --   --   --  28 27 25 28   ANIONGAP 11  --   --   --   --  9 9 10 7   GLUCOSE 100*  --   --   --   --  103* 93 86 89  BUN 9  --   --   --   --  9 7 8 8   CREATININE 0.74  --   --   --   --  0.63 0.72 0.65 0.56  CRP  --   --   --   --   --   --   --   --  5.4*  PROCALCITON  --   --   --  0.18  --   --   --  <0.10 <0.10  LATICACIDVEN  --  0.8 0.8  --  0.6  --   --   --   --   TSH  --   --   --   --   --   --   --  5.990*  --   BNP 56.7  --   --   --   --   --   --   --   --   MG  --   --   --   --   --  2.4  --  2.3 2.2  PHOS  --   --   --   --   --  5.2*  --  4.1  --   CALCIUM 9.1  --   --   --   --  8.5* 8.5* 8.5* 8.7*    Total Time in preparing paper work, data evaluation and todays exam - 35 minutes  Signature  -    Susa Raring M.D on 07/11/2023 at 9:53 AM   -  To page go to [email or web address]

## 2023-07-11 NOTE — Progress Notes (Signed)
Patient ambulated on room air and dropped to 78%. Patient placed on 4L [place] and recovered to 94% while ambulating.

## 2023-07-11 NOTE — Plan of Care (Signed)
  Problem: Health Behavior/Discharge Planning: Goal: Ability to manage health-related needs will improve Outcome: Progressing   Problem: Clinical Measurements: Goal: Ability to maintain clinical measurements within normal limits will improve Outcome: Progressing Goal: Will remain free from infection Outcome: Progressing Goal: Respiratory complications will improve Outcome: Progressing   Problem: Activity: Goal: Risk for activity intolerance will decrease Outcome: Progressing   Problem: Safety: Goal: Ability to remain free from injury will improve Outcome: Progressing

## 2023-07-11 NOTE — Plan of Care (Signed)
                                      [facility]                            [address] [place], [place] [address]      Tre Hunker was admitted to the Hospital on 07/06/2023 and Discharged  07/11/2023 and should be excused from work/school   for 7 days starting from date -  07/06/2023 , may return to work/school without any restrictions.  Call Susa Raring MD, [facility]  [phone] with questions.  Susa Raring M.D on 07/11/2023,at 10:33 AM  [facility]   Office  [phone]

## 2023-07-11 NOTE — Progress Notes (Signed)
Rounding Note    Patient Name: Amber Arroyo Date of Encounter: 07/11/2023  [facility] Cardiologist: New  Subjective   No CP; denies dyspnea  Inpatient Medications    Scheduled Meds:  apixaban  10 mg Oral BID   Followed by   Melene Muller ON 07/17/2023] apixaban  5 mg Oral BID   Chlorhexidine Gluconate Cloth  6 each Topical Daily   dapagliflozin propanediol  10 mg Oral Daily   doxycycline  100 mg Oral Q12H   ferrous sulfate  325 mg Oral Q breakfast   folic acid  1 mg Oral Daily   metoprolol succinate  50 mg Oral Daily   sacubitril-valsartan  1 tablet Oral BID   spironolactone  12.5 mg Oral Daily   Continuous Infusions:   PRN Meds: acetaminophen, guaiFENesin-codeine, ipratropium-albuterol, ondansetron (ZOFRAN) IV, mouth rinse   Vital Signs    Vitals:   07/11/23 0347 07/11/23 0400 07/11/23 0500 07/11/23 0800  BP: (!) 137/115 (!) 133/98  (!) 114/96  Pulse: 92 93  (!) 105  Resp: 16 16  19   Temp: 98.2 F (36.8 C)   98 F (36.7 C)  TempSrc: Oral   Oral  SpO2: 100% 99%  94%  Weight:   121 kg   Height:        Intake/Output Summary (Last 24 hours) at 07/11/2023 0902 Last data filed at 07/11/2023 0350 Gross per 24 hour  Intake --  Output 1200 ml  Net -1200 ml      07/11/2023    5:00 AM 07/10/2023    5:00 AM 07/06/2023    5:00 PM  Last 3 Weights  Weight (lbs) 266 lb 12.1 oz 270 lb 15.1 oz 270 lb 1 oz  Weight (kg) 121 kg 122.9 kg 122.5 kg      Telemetry    Sinus to sinus tachycardi- Personally Reviewed   Physical Exam   GEN: NAD.  Obese Neck: supple Cardiac: RRR, no gallop Respiratory: CTA GI: Soft, NT/ND MS: No edema Neuro:  Grossly intact Psych: Normal affect   Labs    High Sensitivity Troponin:   Recent Labs  Lab 07/06/23 1500 07/06/23 1620  TROPONINIHS 75* 243*     Chemistry Recent Labs  Lab 07/07/23 0234 07/08/23 1050 07/09/23 0624 07/10/23 0532  NA 142 140 139 137  K 3.6 3.7 4.5 4.2  CL 105 104 104 102  CO2 28 27 25 28    GLUCOSE 103* 93 86 89  BUN 9 7 8 8   CREATININE 0.63 0.72 0.65 0.56  CALCIUM 8.5* 8.5* 8.5* 8.7*  MG 2.4  --  2.3 2.2  GFRNONAA >60 >60 >60 >60  ANIONGAP 9 9 10 7     Hematology Recent Labs  Lab 07/07/23 0234 07/07/23 2330 07/10/23 0532  WBC 10.4 7.9 6.1  RBC 4.13 3.66* 3.85*  HGB 8.6* 9.5* 8.7*  HCT 28.5* 27.5* 28.0*  MCV 69.0* 75.1* 72.7*  MCH 20.8* 26.0 22.6*  MCHC 30.2 34.5 31.1  RDW 21.3* 23.5* 22.7*  PLT 274 305 331   Thyroid  Recent Labs  Lab 07/09/23 0624 07/09/23 1139  TSH 5.990*  --   FREET4  --  0.89    BNP Recent Labs  Lab 07/06/23 0530  BNP 56.7       Patient Profile     28 year old female with past medical history of asthma, obesity, hypertension admitted with pulmonary embolus for evaluation of cardiomyopathy and pulmonary embolus.   Lower extremity venous Dopplers showed acute DVT in the  left femoral vein and left gastrocnemius vein.  Echocardiogram showed ejection fraction 35 to 40%, mild left ventricular hypertrophy, probable moderate RV dysfunction.  CTA showed acute saddle pulmonary embolus with CT evidence of right heart strain and possible multi lobar pneumonia.    Assessment & Plan    1 cardiomyopathy-etiology unclear.  No history of alcohol use.  Question hypertensive mediated.  Continue Toprol, entresto, farxiga and spironolactone.  Titrate medications as an outpatient.  Repeat echocardiogram in 3 months. If ejection fraction remains decreased would need further evaluation including cardiac MRI.  Doubt ischemia given patient's age.   2 pulmonary embolus-this is felt to be provoked with decreased mobility over the preceding week due to upper respiratory infection, obesity and reduced LV function.  Also on norethindrone at home.  Continue apixaban 10 mg twice daily for 7 days then 5 mg daily thereafter. Duration of anticoagulation to be determined.  If LV function does not improve may need lifelong anticoagulation.  Note her pulmonary embolus  was a saddle embolus with RV strain and required half dose thrombolytic therapy.  Also note there has been no recent travel or surgical procedures.   3 microcytic anemia-likely secondary to menstruation.  Will need to have hemoglobin followed closely with addition of anticoagulation.   4 hypertension- folllow blood pressure is an outpatient and advance regimen if needed.  5 snoring-patient needs outpatient sleep study.  Pt to be discharged; continue present meds; check bmet one week; fu with APP 4 weeks and me in 4 months.  For questions or updates, please contact [facility] Please consult [email or web address] for contact info under        Signed, Olga Millers, MD  07/11/2023, 9:02 AM

## 2023-07-11 NOTE — TOC Transition Note (Signed)
Transition of Care [facility]) - Discharge Note   Patient Details  Name: Amber Arroyo MRN: [id number] Date of Birth: Nov 17, 1995  [facility] CM/SW Contact:  Lawerance Sabal, RN Phone Number: 07/11/2023, 10:36 AM   Clinical Narrative:     Eliquis 30 day and copay reductions card given to patient.  Home oxygen set up through [employer]. Tank will be delivered to the room for transport home   Final next level of care: Home/Self Care Barriers to Discharge: No Barriers Identified   Patient Goals and CMS Choice            Discharge Placement                       Discharge Plan and Services Additional resources added to the After Visit Summary for                  DME Arranged: Oxygen DME Agency: [employer] Date DME Agency Contacted: 07/11/23 Time DME Agency Contacted: [phone] Representative spoke with at DME Agency: Vaughan Basta            Social Drivers of Health (SDOH) Interventions SDOH Screenings   Food Insecurity: No Food Insecurity (07/09/2023)  Housing: Unknown (07/08/2023)  Transportation Needs: No Transportation Needs (07/08/2023)  Utilities: Not At Risk (07/09/2023)  Alcohol Screen: Low Risk  (07/08/2023)  Depression (PHQ2-9): Low Risk  (07/21/2021)  Financial Resource Strain: Low Risk  (07/08/2023)  Tobacco Use: Low Risk  (07/06/2023)     Readmission Risk Interventions     No data to display

## 2023-07-13 ENCOUNTER — Other Ambulatory Visit (HOSPITAL_COMMUNITY): Payer: Self-pay

## 2023-07-13 ENCOUNTER — Telehealth: Payer: Self-pay | Admitting: Cardiology

## 2023-07-13 NOTE — Telephone Encounter (Signed)
  Patient would like to know if she is okay to go back to work since her hospital stay. Dr Jens Som saw her in the hospital.

## 2023-07-13 NOTE — Telephone Encounter (Signed)
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 She reports that she is feeling fine. Her follow up appt is 1/27 with heart failure. Aware will forward for dr Ludwig Clarks review

## 2023-07-13 NOTE — Telephone Encounter (Signed)
Left detailed message for patient to check with pulmonary before going back to work.

## 2023-07-14 ENCOUNTER — Ambulatory Visit (HOSPITAL_COMMUNITY): Payer: Managed Care, Other (non HMO) | Attending: Internal Medicine

## 2023-07-14 DIAGNOSIS — I2609 Other pulmonary embolism with acute cor pulmonale: Secondary | ICD-10-CM | POA: Diagnosis present

## 2023-07-14 LAB — ECHOCARDIOGRAM COMPLETE
AR max vel: 2.35 cm2
AV Area VTI: 2.25 cm2
AV Area mean vel: 2.34 cm2
AV Mean grad: 3.5 mm[Hg]
AV Peak grad: 6.7 mm[Hg]
Ao pk vel: 1.29 m/s
Area-P 1/2: 4.1 cm2
Est EF: 40
S' Lateral: 4.06 cm

## 2023-07-19 ENCOUNTER — Encounter (HOSPITAL_COMMUNITY): Payer: BC Managed Care – PPO

## 2023-07-20 ENCOUNTER — Ambulatory Visit (HOSPITAL_COMMUNITY)
Admission: RE | Admit: 2023-07-20 | Discharge: 2023-07-20 | Disposition: A | Discharge status: Home / Self Care | Payer: Managed Care, Other (non HMO) | Source: Ambulatory Visit | Attending: Adult Health | Admitting: Adult Health

## 2023-07-20 VITALS — BP 132/82 | HR 95 | Wt 269.0 lb

## 2023-07-20 DIAGNOSIS — I2692 Saddle embolus of pulmonary artery without acute cor pulmonale: Secondary | ICD-10-CM | POA: Diagnosis not present

## 2023-07-20 DIAGNOSIS — I2782 Chronic pulmonary embolism: Secondary | ICD-10-CM

## 2023-07-20 DIAGNOSIS — I82402 Acute embolism and thrombosis of unspecified deep veins of left lower extremity: Secondary | ICD-10-CM | POA: Insufficient documentation

## 2023-07-20 DIAGNOSIS — Z7984 Long term (current) use of oral hypoglycemic drugs: Secondary | ICD-10-CM | POA: Diagnosis not present

## 2023-07-20 DIAGNOSIS — I10 Essential (primary) hypertension: Secondary | ICD-10-CM

## 2023-07-20 DIAGNOSIS — I110 Hypertensive heart disease with heart failure: Secondary | ICD-10-CM | POA: Insufficient documentation

## 2023-07-20 DIAGNOSIS — Z79899 Other long term (current) drug therapy: Secondary | ICD-10-CM | POA: Diagnosis not present

## 2023-07-20 DIAGNOSIS — Z7901 Long term (current) use of anticoagulants: Secondary | ICD-10-CM | POA: Diagnosis not present

## 2023-07-20 DIAGNOSIS — Z86718 Personal history of other venous thrombosis and embolism: Secondary | ICD-10-CM | POA: Insufficient documentation

## 2023-07-20 DIAGNOSIS — J45909 Unspecified asthma, uncomplicated: Secondary | ICD-10-CM | POA: Insufficient documentation

## 2023-07-20 DIAGNOSIS — I5022 Chronic systolic (congestive) heart failure: Secondary | ICD-10-CM | POA: Diagnosis present

## 2023-07-20 LAB — BASIC METABOLIC PANEL
Anion gap: 7 (ref 5–15)
BUN: 12 mg/dL (ref 6–20)
CO2: 24 mmol/L (ref 22–32)
Calcium: 9.3 mg/dL (ref 8.9–10.3)
Chloride: 108 mmol/L (ref 98–111)
Creatinine, Ser: 0.62 mg/dL (ref 0.44–1.00)
GFR, Estimated: >60 mL/min (ref >60)
Glucose, Bld: 86 mg/dL (ref 70–99)
Potassium: 4 mmol/L (ref 3.5–5.1)
Sodium: 139 mmol/L (ref 135–145)

## 2023-07-20 MED ORDER — FUROSEMIDE 20 MG PO TABS: 20.0000 mg | ORAL_TABLET | ORAL | 3 refills | Status: DC | PRN

## 2023-07-20 MED ORDER — SPIRONOLACTONE 25 MG PO TABS: 25.0000 mg | ORAL_TABLET | Freq: Every day | ORAL | 3 refills | Status: AC

## 2023-07-20 NOTE — Patient Instructions (Signed)
Great to see you today!!!  Medication Changes:  TAKE Eliquis 10 mg (2 tabs) Twice daily today, Wednesday, and Thursday **Starting Friday 1/31 Decrease to 5 mg (1 tab) Twice daily   Increase spironolactone to 25 mg (1 tab) Daily  Take Furosemide 20 mg AS NEEDED for weight gain of 3-4 lbs over night  Lab Work:  Labs done today, your results will be available in MyChart, we will contact you for abnormal readings.   Special Instructions // Education:  Do the following things EVERYDAY: Weigh yourself in the morning before breakfast. Write it down and keep it in a log. Take your medicines as prescribed Eat low salt foods--Limit salt (sodium) to 2000 mg per day.  Stay as active as you can everyday Limit all fluids for the day to less than 2 liters   Follow-Up in: 2 weeks   If you have any questions, issues, or concerns before your next appointment please call our office at [phone], opt. 2 and leave a message for the triage nurse.

## 2023-07-20 NOTE — Progress Notes (Addendum)
HEART & VASCULAR TRANSITION OF CARE CONSULT NOTE     Referring Physician: Primary Care: Leroy Kennedy PA-C  Primary Cardiologist: Dr Jens Som  Chief Complaint: Heart Failure   HPI: Referred to clinic by Dr Jens Som for heart failure consultation.   Amber Arroyo is a 28 year old with a history of asthma, LLE DVT, IDA, HTN, and obesity. No family history blood clots. No history of oral contraceptives.   Had URI January 6th. Developed cough and progressive shortness breath. Sleeping on her knees because she couldn't catch her breath.   Presented to [facility] ED 07/06/23 with increased shortness of breath. Admitted with acute saddled PE. CTA + PE with evidence of R heart strain and multilobar pneumonia. Started on Heparin drip. Acute Left Femoral Vein + DVT. Echo showed LVEF 35-40% RV ok.  Started on GDMT with 4 pillars of HF. Discharged on eliquis for PE with a taper.   Overall feeling fine. Breathing is getting better. SOB with steps. Denies PND/Orthopnea. Appetite ok. No fever or chills. Weight at home 264 pounds. Taking all medications but has been taking eliquis 5 mg twice a day. She is sexually active. She uses condoms to prevent pregnancy.  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  Catches an UBER to get to work.    Cardiac Testing  07/14/23 Echo Ef 40%  RV ok.  07/07/23 Echo EF 35-40% RV not well visualized.    Past Medical History:  Diagnosis Date   Allergy    eczema, seasonal allergies   Asthma    Eczema    Essential hypertension    Obesity, Class III, BMI 40-49.9 (morbid obesity) (HCC) 11/18/2020    Current Outpatient Medications  Medication Sig Dispense Refill   acetaminophen (TYLENOL) 500 MG tablet Take 1,000 mg by mouth every 6 (six) hours as needed for mild pain (pain score 1-3) or moderate pain (pain score 4-6).     albuterol (VENTOLIN HFA) 108 (90 Base) MCG/ACT inhaler Inhale 2 puffs into the lungs every 4 (four) hours as needed for wheezing or shortness of breath.      apixaban (ELIQUIS) 5 MG TABS tablet Take 2 tablets (10 mg total) by mouth 2 (two) times daily for 6 days, THEN 1 tablet (5 mg total) 2 (two) times daily. 84 tablet 0   empagliflozin (JARDIANCE) 10 MG TABS tablet Take 1 tablet (10 mg total) by mouth daily. 30 tablet 0   EPINEPHrine 0.3 mg/0.3 mL IJ SOAJ injection Inject 0.3 mg into the muscle See admin instructions. (Patient taking differently: Inject 0.3 mg into the muscle as needed for anaphylaxis.) 1 each 2   folic acid (FOLVITE) 1 MG tablet Take 1 tablet (1 mg total) by mouth daily. 30 tablet 0   hydrOXYzine (ATARAX) 25 MG tablet Take 25 mg by mouth as needed for anxiety.     metoprolol succinate (TOPROL-XL) 50 MG 24 hr tablet Take 1 tablet (50 mg total) by mouth daily. Take with or immediately following a meal. 30 tablet 0   sacubitril-valsartan (ENTRESTO) 24-26 MG Take 1 tablet by mouth 2 (two) times daily. 60 tablet 0   spironolactone (ALDACTONE) 25 MG tablet Take 0.5 tablets (12.5 mg total) by mouth daily. 15 tablet 0   triamcinolone cream (KENALOG) 0.5 % Apply 1 Application topically 2 (two) times daily.     FEROSUL 325 (65 Fe) MG tablet Take 325 mg by mouth daily.     No current facility-administered medications for this encounter.  Allergies  Allergen Reactions   Fish-Derived Products Shortness Of Breath, Swelling and Other (See Comments)    Lips swell   Shellfish-Derived Products Shortness Of Breath, Swelling and Other (See Comments)    Lips swell   Tree Extract Other (See Comments)    Patient was told she is allergic to trees      Social History   Socioeconomic History   Marital status: Single    Spouse name: Not on file   Number of children: 0   Years of education: Not on file   Highest education level: High school graduate  Occupational History   Not on file  Tobacco Use   Smoking status: Never   Smokeless tobacco: Never  Vaping Use   Vaping status: Never Used  Substance and Sexual Activity   Alcohol use: No    Drug use: No   Sexual activity: Yes    Birth control/protection: Condom, None  Other Topics Concern   Not on file  Social History Narrative   Lives with boyfriend.  No pets.   Social Drivers of Corporate investment banker Strain: Low Risk  (07/08/2023)   Overall Financial Resource Strain (CARDIA)    Difficulty of Paying Living Expenses: Not very hard  Food Insecurity: No Food Insecurity (07/09/2023)   Hunger Vital Sign    Worried About Running Out of Food in the Last Year: Never true    Ran Out of Food in the Last Year: Never true  Transportation Needs: No Transportation Needs (07/08/2023)   PRAPARE - Administrator, Civil Service (Medical): No    Lack of Transportation (Non-Medical): No  Physical Activity: Not on file  Stress: Not on file  Social Connections: Not on file  Intimate Partner Violence: Not At Risk (07/09/2023)   Humiliation, Afraid, Rape, and Kick questionnaire    Fear of Current or Ex-Partner: No    Emotionally Abused: No    Physically Abused: No    Sexually Abused: No      Family History  Problem Relation Age of Onset   Hypertension Mother    Asthma Mother    Eczema Mother    Hypertension Father     Vitals:   07/20/23 1428  BP: 132/82  Pulse: 95  SpO2: 98%  Weight: 122 kg (269 lb)   Wt Readings from Last 3 Encounters:  07/20/23 122 kg (269 lb)  07/11/23 121 kg (266 lb 12.1 oz)  06/28/23 122.5 kg (270 lb)    PHYSICAL EXAM: General: Walked in the clinic.  No respiratory difficulty HEENT: normal Neck: supple. no JVD. Carotids 2+ bilat; no bruits. No lymphadenopathy or thryomegaly appreciated. Cor: PMI nondisplaced. Regular rate & rhythm. No rubs, gallops or murmurs. Lungs: clear Abdomen: soft, nontender, nondistended. No hepatosplenomegaly. No bruits or masses. Good bowel sounds. Extremities: no cyanosis, clubbing, rash, R and LLE trace edema Neuro: alert & oriented x 3, cranial nerves grossly intact. moves all 4 extremities w/o  difficulty. Affect pleasant.    ASSESSMENT & PLAN: 1. Chronic HFrEF Echo EF ~ 40%  RV normal. Had PE diagnosed  NYHA II. Appear euvolemic.  GDMT  Diuretic- Add lasix 20 mg daily as needed for 3-4 pound weight gain in 24 hours.  BB-Continue Toprol XL 50 mg daily  Ace/ARB/ARNI- Continue Entresto 24-26 mg twice a day.  MRA- Increase spiro 25 mg daily.   SGLT2i- Continue jardiance 10 mg daily  Follow ECHO in April.  Check BMET   2. PE  -  07/06/23 CTA + saddle PE. Unprovoked.  - She is on eliquis and has been taking 5 mg twice a day. Instructed to take 10 mg (2 tablets twice a day until 1/31 then switch to 5 mg (1 tab) twice a day.  - No bleeding issues.  - Repeat ECHO April.  - Would benefit from genetic testing.    3. DVT LLE On eliquis.   4. HTN  Continue current regimen.   Provided work note.   Referred to HFSW (PCP, Medications, Transportation, ETOH Abuse, Drug Abuse, Insurance, Financial ):  No Refer to Pharmacy:  No Refer to Home Health: No Refer to Advanced Heart Failure Clinic:  no  Refer to General Cardiology: Established with Dr Jens Som  Follow up 3-4 weeks for med titration.   Oluwademilade Mckiver NP-C   3:10 PM

## 2023-07-21 ENCOUNTER — Other Ambulatory Visit (HOSPITAL_COMMUNITY): Payer: Self-pay

## 2023-08-04 ENCOUNTER — Ambulatory Visit (HOSPITAL_COMMUNITY): Payer: BC Managed Care – PPO

## 2023-08-04 NOTE — Progress Notes (Signed)
HEART IMPACT TRANSITIONS OF CARE    PCP: Kathreen Cornfield, PA-C  Primary Cardiologist: Dr. Jens Som  Chief Complaint: Heart failure follow up  HPI: Amber Arroyo is a 28 year old with a history of asthma, LLE DVT, IDA, HTN, and obesity. No family history blood clots. No history of oral contraceptives.    Had URI January 6th. Developed cough and progressive shortness breath. Sleeping on her knees because she couldn't catch her breath.    Presented to [facility] ED 07/06/23 with increased shortness of breath. Admitted with acute saddled PE. CTA + PE with evidence of R heart strain and multilobar pneumonia. Started on Heparin drip. Acute Left Femoral Vein + DVT. Echo showed LVEF 35-40% RV ok.  Started on GDMT with 4 pillars of HF. Discharged on eliquis for PE with a taper.    Today she returns for AHF TOC follow up. Overall feeling ok just worried about some swelling in her legs/feet. Denies palpitations, CP, dizziness, or PND/Orthopnea. Denies SOB. Appetite ok, tries to watch what she eats. No fever or chills. Weight at home 273 pounds. Taking all medications. Takes lasix once a week. 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 Drinks a lot of fluid daily.    Cardiac Testing  07/14/23 Echo EF 40%  RV ok.  07/07/23 Echo EF 35-40% RV not well visualized.   ROS: All systems negative except as listed in HPI, PMH and Problem List.  SH:  Social History   Socioeconomic History   Marital status: Single    Spouse name: Not on file   Number of children: 0   Years of education: Not on file   Highest education level: High school graduate  Occupational History   Not on file  Tobacco Use   Smoking status: Never   Smokeless tobacco: Never  Vaping Use   Vaping status: Never Used  Substance and Sexual Activity   Alcohol use: No   Drug use: No   Sexual activity: Yes    Birth control/protection: Condom, None  Other Topics Concern   Not on file  Social History Narrative   Lives with boyfriend.  No  pets.   Social Drivers of Corporate investment banker Strain: Low Risk  (07/08/2023)   Overall Financial Resource Strain (CARDIA)    Difficulty of Paying Living Expenses: Not very hard  Food Insecurity: No Food Insecurity (07/09/2023)   Hunger Vital Sign    Worried About Running Out of Food in the Last Year: Never true    Ran Out of Food in the Last Year: Never true  Transportation Needs: No Transportation Needs (07/08/2023)   PRAPARE - Administrator, Civil Service (Medical): No    Lack of Transportation (Non-Medical): No  Physical Activity: Not on file  Stress: Not on file  Social Connections: Not on file  Intimate Partner Violence: Not At Risk (07/09/2023)   Humiliation, Afraid, Rape, and Kick questionnaire    Fear of Current or Ex-Partner: No    Emotionally Abused: No    Physically Abused: No    Sexually Abused: No    FH:  Family History  Problem Relation Age of Onset   Hypertension Mother    Asthma Mother    Eczema Mother    Hypertension Father     Past Medical History:  Diagnosis Date   Allergy    eczema, seasonal allergies   Asthma    Eczema    Essential hypertension    Obesity,  Class III, BMI 40-49.9 (morbid obesity) (HCC) 11/18/2020    Current Outpatient Medications  Medication Sig Dispense Refill   acetaminophen (TYLENOL) 500 MG tablet Take 1,000 mg by mouth every 6 (six) hours as needed for mild pain (pain score 1-3) or moderate pain (pain score 4-6).     albuterol (VENTOLIN HFA) 108 (90 Base) MCG/ACT inhaler Inhale 2 puffs into the lungs every 4 (four) hours as needed for wheezing or shortness of breath.     EPINEPHrine 0.3 mg/0.3 mL IJ SOAJ injection Inject 0.3 mg into the muscle See admin instructions. (Patient taking differently: Inject 0.3 mg into the muscle as needed for anaphylaxis.) 1 each 2   FEROSUL 325 (65 Fe) MG tablet Take 325 mg by mouth daily.     folic acid (FOLVITE) 1 MG tablet Take 1 tablet (1 mg total) by mouth daily. 30 tablet 0    hydrOXYzine (ATARAX) 25 MG tablet Take 25 mg by mouth as needed for anxiety.     sacubitril-valsartan (ENTRESTO) 49-51 MG Take 1 tablet by mouth 2 (two) times daily. 60 tablet 2   spironolactone (ALDACTONE) 25 MG tablet Take 1 tablet (25 mg total) by mouth daily. 30 tablet 3   triamcinolone cream (KENALOG) 0.5 % Apply 1 Application topically 2 (two) times daily.     apixaban (ELIQUIS) 5 MG TABS tablet Take 1 tablet (5 mg total) by mouth 2 (two) times daily. 60 tablet 2   empagliflozin (JARDIANCE) 10 MG TABS tablet Take 1 tablet (10 mg total) by mouth daily. 30 tablet 2   furosemide (LASIX) 20 MG tablet Take 1 tablet (20 mg total) by mouth daily. 30 tablet 3   metoprolol succinate (TOPROL-XL) 50 MG 24 hr tablet Take 1 tablet (50 mg total) by mouth daily. Take with or immediately following a meal. 30 tablet 2   No current facility-administered medications for this encounter.    Vitals:   08/09/23 1533  BP: 138/80  Pulse: 82  SpO2: 98%  Weight: 126.1 kg (278 lb)   PHYSICAL EXAM: General:  well appearing.  No respiratory difficulty. Walked into clinic HEENT: normal Neck: supple. JVD ~10 cm. Carotids 2+ bilat; no bruits. No lymphadenopathy or thyromegaly appreciated. Cor: PMI nondisplaced. Regular rate & rhythm. No rubs, gallops or murmurs. Lungs: clear, diminished bases Abdomen: obese, soft, nontender, nondistended. No hepatosplenomegaly. No bruits or masses. Good bowel sounds. Extremities: no cyanosis, clubbing, rash, +1 BLE edema  Neuro: alert & oriented x 3, cranial nerves grossly intact. moves all 4 extremities w/o difficulty. Affect pleasant.   Wt Readings from Last 3 Encounters:  08/09/23 126.1 kg (278 lb)  07/20/23 122 kg (269 lb)  07/11/23 121 kg (266 lb 12.1 oz)    ECG: None today  ASSESSMENT & PLAN: 1. Chronic HFrEF Echo EF ~ 40%  RV normal. Had PE diagnosed  NYHA II. Volume appears mildly elevated.  GDMT  Diuretic- Increase lasix 20 mg PRN to daily with high volume  intake BB-Continue Toprol XL 50 mg daily  Ace/ARB/ARNI- Increase Entresto 24-26>49/51 mg twice a day. BMET/BNP today. Repeat BMET ~7 days.  MRA- Continue spiro 25 mg daily.   SGLT2i- Continue jardiance 10 mg daily  Follow ECHO in April.    2. PE  - 07/06/23 CTA + saddle PE. Unprovoked.  - She is on eliquis and has been taking 5 mg twice a day - No bleeding issues.  - Repeat echo April.  - Would benefit from genetic testing.     3.  DVT LLE On eliquis.    4. HTN  Continue current regimen.     Referred to HFSW (PCP, Medications, Transportation, ETOH Abuse, Drug Abuse, Insurance, Financial ):  No Refer to Pharmacy:  No Refer to Home Health: No Refer to Advanced Heart Failure Clinic:  No  Refer to General Cardiology: Established with Dr Jens Som   Follow up with Dr. Jens Som

## 2023-08-09 ENCOUNTER — Encounter (HOSPITAL_COMMUNITY): Payer: Self-pay

## 2023-08-09 ENCOUNTER — Ambulatory Visit (HOSPITAL_COMMUNITY)
Admission: RE | Admit: 2023-08-09 | Discharge: 2023-08-09 | Disposition: A | Discharge status: Home / Self Care | Payer: Managed Care, Other (non HMO) | Source: Ambulatory Visit | Attending: Adult Health | Admitting: Adult Health

## 2023-08-09 ENCOUNTER — Other Ambulatory Visit (HOSPITAL_COMMUNITY): Payer: Self-pay

## 2023-08-09 VITALS — BP 138/80 | HR 82 | Wt 278.0 lb

## 2023-08-09 DIAGNOSIS — Z7901 Long term (current) use of anticoagulants: Secondary | ICD-10-CM | POA: Diagnosis not present

## 2023-08-09 DIAGNOSIS — Z79899 Other long term (current) drug therapy: Secondary | ICD-10-CM | POA: Diagnosis not present

## 2023-08-09 DIAGNOSIS — I82402 Acute embolism and thrombosis of unspecified deep veins of left lower extremity: Secondary | ICD-10-CM | POA: Diagnosis not present

## 2023-08-09 DIAGNOSIS — Z86711 Personal history of pulmonary embolism: Secondary | ICD-10-CM | POA: Insufficient documentation

## 2023-08-09 DIAGNOSIS — J45909 Unspecified asthma, uncomplicated: Secondary | ICD-10-CM | POA: Insufficient documentation

## 2023-08-09 DIAGNOSIS — I2782 Chronic pulmonary embolism: Secondary | ICD-10-CM

## 2023-08-09 DIAGNOSIS — I5022 Chronic systolic (congestive) heart failure: Secondary | ICD-10-CM | POA: Diagnosis not present

## 2023-08-09 DIAGNOSIS — E669 Obesity, unspecified: Secondary | ICD-10-CM | POA: Diagnosis not present

## 2023-08-09 DIAGNOSIS — I110 Hypertensive heart disease with heart failure: Secondary | ICD-10-CM | POA: Diagnosis present

## 2023-08-09 DIAGNOSIS — Z7984 Long term (current) use of oral hypoglycemic drugs: Secondary | ICD-10-CM | POA: Diagnosis not present

## 2023-08-09 DIAGNOSIS — Z86718 Personal history of other venous thrombosis and embolism: Secondary | ICD-10-CM | POA: Diagnosis present

## 2023-08-09 DIAGNOSIS — I2692 Saddle embolus of pulmonary artery without acute cor pulmonale: Secondary | ICD-10-CM | POA: Diagnosis not present

## 2023-08-09 DIAGNOSIS — I10 Essential (primary) hypertension: Secondary | ICD-10-CM

## 2023-08-09 LAB — BASIC METABOLIC PANEL
Anion gap: 6 (ref 5–15)
BUN: 10 mg/dL (ref 6–20)
CO2: 24 mmol/L (ref 22–32)
Calcium: 9.1 mg/dL (ref 8.9–10.3)
Chloride: 109 mmol/L (ref 98–111)
Creatinine, Ser: 0.7 mg/dL (ref 0.44–1.00)
GFR, Estimated: >60 mL/min (ref >60)
Glucose, Bld: 81 mg/dL (ref 70–99)
Potassium: 3.7 mmol/L (ref 3.5–5.1)
Sodium: 139 mmol/L (ref 135–145)

## 2023-08-09 LAB — BRAIN NATRIURETIC PEPTIDE: B Natriuretic Peptide: 15.9 pg/mL (ref 0.0–100.0)

## 2023-08-09 MED ORDER — FUROSEMIDE 20 MG PO TABS
20.0000 mg | ORAL_TABLET | Freq: Every day | ORAL | 3 refills | Status: AC
  Filled 2023-08-09: qty 30, 30d supply, fill #0

## 2023-08-09 MED ORDER — EMPAGLIFLOZIN 10 MG PO TABS
10.0000 mg | ORAL_TABLET | Freq: Every day | ORAL | 2 refills | Status: AC
Start: 2023-08-09 — End: ?
  Filled 2023-08-09 – 2023-09-17 (×2): qty 30, 30d supply, fill #0

## 2023-08-09 MED ORDER — SACUBITRIL-VALSARTAN 49-51 MG PO TABS
1.0000 | ORAL_TABLET | Freq: Two times a day (BID) | ORAL | 2 refills | Status: AC
  Filled 2023-08-09 – 2023-09-17 (×2): qty 60, 30d supply, fill #0

## 2023-08-09 MED ORDER — METOPROLOL SUCCINATE ER 50 MG PO TB24
50.0000 mg | ORAL_TABLET | Freq: Every day | ORAL | 2 refills | Status: AC
  Filled 2023-08-09 – 2023-09-17 (×2): qty 30, 30d supply, fill #0

## 2023-08-09 MED ORDER — APIXABAN 5 MG PO TABS
5.0000 mg | ORAL_TABLET | Freq: Two times a day (BID) | ORAL | 2 refills | Status: AC
  Filled 2023-08-09 – 2023-09-29 (×3): qty 60, 30d supply, fill #0

## 2023-08-09 NOTE — Patient Instructions (Signed)
Great to see you today!!!  Medication Changes:  INCREASE Entresto to 49/51 mg Twice daily   INCREASE Furosemide to 20 mg Daily  Lab Work:  Labs done today, your results will be available in MyChart, we will contact you for abnormal readings.  Your physician recommends that you return for lab work in: 1-2 weeks  Special Instructions // Education:  Do the following things EVERYDAY: Weigh yourself in the morning before breakfast. Write it down and keep it in a log. Take your medicines as prescribed Eat low salt foods--Limit salt (sodium) to 2000 mg per day.  Stay as active as you can everyday Limit all fluids for the day to less than 2 liters   Follow-Up in: Thank you for allowing [place] to provider your heart failure care after your recent hospitalization. Please follow-up with Dr Jens Som at [facility] as scheduled   If you have any questions, issues, or concerns before your next appointment please call our office at [phone], opt. 2 and leave a message for the triage nurse.

## 2023-08-10 ENCOUNTER — Inpatient Hospital Stay: Payer: BC Managed Care – PPO | Admitting: Primary Care

## 2023-08-16 ENCOUNTER — Other Ambulatory Visit (HOSPITAL_COMMUNITY): Payer: Managed Care, Other (non HMO)

## 2023-08-24 ENCOUNTER — Other Ambulatory Visit (HOSPITAL_COMMUNITY): Payer: Managed Care, Other (non HMO)

## 2023-08-27 ENCOUNTER — Ambulatory Visit (HOSPITAL_COMMUNITY)
Admission: RE | Admit: 2023-08-27 | Discharge: 2023-08-27 | Disposition: A | Discharge status: Home / Self Care | Source: Ambulatory Visit | Attending: Internal Medicine | Admitting: Internal Medicine

## 2023-08-27 DIAGNOSIS — I5022 Chronic systolic (congestive) heart failure: Secondary | ICD-10-CM | POA: Diagnosis present

## 2023-08-27 LAB — BASIC METABOLIC PANEL
Anion gap: 8 (ref 5–15)
BUN: 15 mg/dL (ref 6–20)
CO2: 24 mmol/L (ref 22–32)
Calcium: 9.4 mg/dL (ref 8.9–10.3)
Chloride: 109 mmol/L (ref 98–111)
Creatinine, Ser: 0.61 mg/dL (ref 0.44–1.00)
GFR, Estimated: >60 mL/min (ref >60)
Glucose, Bld: 95 mg/dL (ref 70–99)
Potassium: 3.6 mmol/L (ref 3.5–5.1)
Sodium: 141 mmol/L (ref 135–145)

## 2023-09-13 ENCOUNTER — Inpatient Hospital Stay: Payer: BC Managed Care – PPO | Admitting: Primary Care

## 2023-09-17 ENCOUNTER — Other Ambulatory Visit (HOSPITAL_COMMUNITY): Payer: Self-pay

## 2023-09-17 ENCOUNTER — Other Ambulatory Visit: Payer: Self-pay

## 2023-09-17 ENCOUNTER — Encounter (HOSPITAL_COMMUNITY): Payer: Self-pay | Admitting: Pharmacist

## 2023-09-23 ENCOUNTER — Other Ambulatory Visit (HOSPITAL_COMMUNITY): Payer: Self-pay

## 2023-09-24 ENCOUNTER — Telehealth (HOSPITAL_COMMUNITY): Payer: Self-pay

## 2023-09-24 ENCOUNTER — Other Ambulatory Visit (HOSPITAL_COMMUNITY): Payer: Self-pay

## 2023-09-24 NOTE — Telephone Encounter (Signed)
 Advanced Heart Failure Patient Advocate Encounter  Prior authorization for [place] has been submitted and approved. Test billing returns $75 for 90 day supply. This patient is eligible to use a copay savings card.   KeyLowella Grip Effective: 09/24/2023 to 06/21/2098  Burnell Blanks, CPhT Rx Patient Advocate Phone: [phone]

## 2023-09-29 ENCOUNTER — Other Ambulatory Visit (HOSPITAL_COMMUNITY): Payer: Self-pay

## 2023-09-30 ENCOUNTER — Other Ambulatory Visit: Payer: Self-pay

## 2023-10-05 ENCOUNTER — Other Ambulatory Visit: Payer: Self-pay

## 2023-10-18 ENCOUNTER — Encounter: Payer: Self-pay | Admitting: Pulmonary Disease

## 2023-10-18 ENCOUNTER — Ambulatory Visit: Payer: BC Managed Care – PPO | Admitting: Pulmonary Disease

## 2023-10-18 VITALS — BP 118/77 | HR 70 | Temp 98.1°F | Resp 18 | Ht 63.0 in | Wt 278.8 lb

## 2023-10-18 DIAGNOSIS — I2602 Saddle embolus of pulmonary artery with acute cor pulmonale: Secondary | ICD-10-CM

## 2023-10-18 NOTE — Patient Instructions (Addendum)
 Nice to see you again  I am glad you are doing better  Your ultrasound normalized after 1 week which showed great signs of the blood clot has gotten better  I recommend stopping the Eliquis , we had a good reason why you have the blood clot and that when things are better I think it is safe to stop it now that you been treated for 3 months  You saw Dr. Sari Cunning for your irregular menses 4.  In the past.  I recommend you contact him again to discuss if there are other contraceptives you could use to help with this to avoid estrogen in the future given your history of blood clots.  Return to clinic as needed

## 2023-10-18 NOTE — Progress Notes (Signed)
 @Patient  ID: Amber Arroyo, female    DOB: [birth year], 28 y.o.   MRN: [id number]  Chief Complaint  Patient presents with   Follow-up    Referring provider: Isabelle Maple, PA-C  HPI:   28 y.o. woman whom we are seeing in hospital follow-up for provoked submassive pulmonary embolism.  Multiple hospital notes including discharge summary reviewed.  Presented to the hospital for 28 days of chest congestion cough shortness of breath.  Previous seen in urgent care negative for COVID and flu.  Symptoms worsened despite negative testing largely bedbound.  Feeling ill.  About 48 hours prior to presenting to the ED developed leg swelling.  She reported presyncope symptoms with standing up which prompted call to EMS.  She was brought to the emergency room.  CT scan demonstrated large pulmonary embolism as well as evidence of possible pneumonia.  Started on antibiotics.  Given her presyncope she was given systemic dose tPA.  She was placed on heparin .  Simply transition to Eliquis .  Symptoms improved.  Initial TTE demonstrated RV dysfunction elevated pulmonary pressures.  Biomarkers were elevated.  Repeat TTE 1 week later showed resolutions of right-sided findings which is reassuring.    She is doing well.  No dyspnea.  Back to working regularly.  She reported irregular these back in the summer 2024 which prompted placement on OCPs after visiting with gynecologist.  She has stopped taking OCP since diagnosis of PE.  We discussed reengaging with her gynecologist to consider other oral contraceptive measures either now or in the future if irregular menses were to redevelop in effort to avoid estrogen therapy.  Questionaires / Pulmonary Flowsheets:   ACT:      No data to display          MMRC:     No data to display          Epworth:      No data to display          Tests:   FENO:  No results found for: "NITRICOXIDE"  PFT:     No data to display          WALK:      No data  to display          Imaging: Personally reviewed and as per EMR and discussion in this note No results found.  Lab Results: Personally reviewed CBC    Component Value Date/Time   WBC 6.1 07/10/2023 0532   RBC 3.85 (L) 07/10/2023 0532   HGB 8.7 (L) 07/10/2023 0532   HGB 11.1 01/03/2021 1454   HCT 28.0 (L) 07/10/2023 0532   HCT 34.3 01/03/2021 1454   PLT 331 07/10/2023 0532   PLT 291 01/03/2021 1454   MCV 72.7 (L) 07/10/2023 0532   MCV 77 (L) 01/03/2021 1454   MCH 22.6 (L) 07/10/2023 0532   MCHC 31.1 07/10/2023 0532   RDW 22.7 (H) 07/10/2023 0532   RDW 15.7 (H) 01/03/2021 1454   LYMPHSABS 1.3 07/06/2023 0530   LYMPHSABS 1.5 01/03/2021 1454   MONOABS 0.5 07/06/2023 0530   EOSABS 0.2 07/06/2023 0530   EOSABS 0.1 01/03/2021 1454   BASOSABS 0.0 07/06/2023 0530   BASOSABS 0.0 01/03/2021 1454    BMET    Component Value Date/Time   NA 141 08/27/2023 1601   NA 140 01/03/2021 1454   K 3.6 08/27/2023 1601   CL 109 08/27/2023 1601   CO2 24 08/27/2023 1601   GLUCOSE 95 08/27/2023 1601   BUN 15  08/27/2023 1601   BUN 16 01/03/2021 1454   CREATININE 0.61 08/27/2023 1601   CALCIUM 9.4 08/27/2023 1601   GFRNONAA >60 08/27/2023 1601   GFRAA 143 07/16/2020 1517    BNP    Component Value Date/Time   BNP 15.9 08/09/2023 1612    ProBNP No results found for: "PROBNP"  Specialty Problems       Pulmonary Problems   Asthma    Allergies  Allergen Reactions   Fish-Derived Products Shortness Of Breath, Swelling and Other (See Comments)    Lips swell   Shellfish-Derived Products Shortness Of Breath, Swelling and Other (See Comments)    Lips swell   Tree Extract Other (See Comments)    Patient was told she is allergic to trees     There is no immunization history on file for this patient.  Past Medical History:  Diagnosis Date   Allergy    eczema, seasonal allergies   Asthma    Eczema    Essential hypertension    Obesity, Class III, BMI 40-49.9 (morbid  obesity) (HCC) 11/18/2020    Tobacco History: Social History   Tobacco Use  Smoking Status Never  Smokeless Tobacco Never   Counseling given: Not Answered   Continue to not smoke  Outpatient Encounter Medications as of 10/18/2023  Medication Sig   acetaminophen  (TYLENOL ) 500 MG tablet Take 1,000 mg by mouth every 6 (six) hours as needed for mild pain (pain score 1-3) or moderate pain (pain score 4-6).   albuterol  (VENTOLIN  HFA) 108 (90 Base) MCG/ACT inhaler Inhale 2 puffs into the lungs every 4 (four) hours as needed for wheezing or shortness of breath.   apixaban  (ELIQUIS ) 5 MG TABS tablet Take 1 tablet (5 mg total) by mouth 2 (two) times daily.   empagliflozin  (JARDIANCE ) 10 MG TABS tablet Take 1 tablet (10 mg total) by mouth daily.   EPINEPHrine  0.3 mg/0.3 mL IJ SOAJ injection Inject 0.3 mg into the muscle See admin instructions. (Patient taking differently: Inject 0.3 mg into the muscle as needed for anaphylaxis.)   FEROSUL 325 (65 Fe) MG tablet Take 325 mg by mouth daily.   folic acid  (FOLVITE ) 1 MG tablet Take 1 tablet (1 mg total) by mouth daily.   furosemide  (LASIX ) 20 MG tablet Take 1 tablet (20 mg total) by mouth daily.   hydrOXYzine (ATARAX) 25 MG tablet Take 25 mg by mouth as needed for anxiety.   metoprolol  succinate (TOPROL -XL) 50 MG 24 hr tablet Take 1 tablet (50 mg total) by mouth daily. Take with or immediately following a meal.   sacubitril -valsartan  (ENTRESTO ) 49-51 MG Take 1 tablet by mouth 2 (two) times daily.   spironolactone  (ALDACTONE ) 25 MG tablet Take 1 tablet (25 mg total) by mouth daily.   triamcinolone  cream (KENALOG ) 0.5 % Apply 1 Application topically 2 (two) times daily.   No facility-administered encounter medications on file as of 10/18/2023.     Review of Systems  Review of Systems   Physical Exam  BP 118/77 (BP Location: Left Arm, Patient Position: Sitting, Cuff Size: Large)   Pulse 70   Temp 98.1 F (36.7 C) (Oral)   Resp 18   Ht 5\' 3"   (1.6 m)   Wt 278 lb 12.8 oz (126.5 kg)   SpO2 98%   BMI 49.39 kg/m   Wt Readings from Last 5 Encounters:  10/18/23 278 lb 12.8 oz (126.5 kg)  08/09/23 278 lb (126.1 kg)  07/20/23 269 lb (122 kg)  07/11/23 266 lb  12.1 oz (121 kg)  06/28/23 270 lb (122.5 kg)    BMI Readings from Last 5 Encounters:  10/18/23 49.39 kg/m  08/09/23 50.04 kg/m  07/20/23 48.42 kg/m  07/11/23 48.01 kg/m  06/28/23 48.60 kg/m     Physical Exam    Assessment & Plan:   Submassive pulmonary embolism: Felt provoked, related to OCP and relative immobility.  Received tPA systemically.  Elevated biomarkers as well as elevated pulmonary pressures on initial echocardiogram.  Has completed 3 months of anticoagulation.  Discussed lifelong use given submassive nature versus stopping given likely provoked PE.  After shared decision making, we agreed to stop anticoagulation given improvement in symptoms and echocardiogram and 3 months of treatment.  I recommend she contact her gynecologist to discuss contraceptive methods to avoid estrogen in the future.  Right ventricular dysfunction and elevated pulmonary artery pressures: In setting of acute submassive PE.  Had ordered repeat echocardiogram at 28-month interval/2025 but this was performed 1 week after initial echocardiogram.  Fortunately, RV dysfunction has normalized and elevated pulmonary pressures had resolved.  Normalization of echocardiogram reassuring.  No further workup at this time.   Return if symptoms worsen or fail to improve, for f/u Dr. Marygrace Snellen.   Guerry Leek, MD 10/18/2023   This appointment required 42 minutes of patient care (this includes precharting, chart review, review of results, face-to-face care, etc.).

## 2023-10-28 NOTE — Progress Notes (Deleted)
 HPI: Follow-up cardiomyopathy and pulmonary embolus.  Patient was admitted January 2025 with saddle pulmonary embolus.  Patient was treated with tPA.  Lower extremity venous Doppler showed acute DVT in the left femoral vein and left gastrocnemius vein.  Echocardiogram showed ejection fraction 35 to 40%, mild left ventricular hypertrophy and probable moderate RV dysfunction.  Follow-up limited study suggestive of small PFO.  CTA showed acute saddle pulmonary embolus with CT evidence of right heart strain.  Patient was seen in follow-up on October 18, 2023 by pulmonary and anticoagulation discontinued.  Since last seen  Current Outpatient Medications  Medication Sig Dispense Refill   acetaminophen  (TYLENOL ) 500 MG tablet Take 1,000 mg by mouth every 6 (six) hours as needed for mild pain (pain score 1-3) or moderate pain (pain score 4-6).     albuterol  (VENTOLIN  HFA) 108 (90 Base) MCG/ACT inhaler Inhale 2 puffs into the lungs every 4 (four) hours as needed for wheezing or shortness of breath.     apixaban  (ELIQUIS ) 5 MG TABS tablet Take 1 tablet (5 mg total) by mouth 2 (two) times daily. 60 tablet 2   empagliflozin  (JARDIANCE ) 10 MG TABS tablet Take 1 tablet (10 mg total) by mouth daily. 30 tablet 2   EPINEPHrine  0.3 mg/0.3 mL IJ SOAJ injection Inject 0.3 mg into the muscle See admin instructions. (Patient taking differently: Inject 0.3 mg into the muscle as needed for anaphylaxis.) 1 each 2   FEROSUL 325 (65 Fe) MG tablet Take 325 mg by mouth daily.     folic acid  (FOLVITE ) 1 MG tablet Take 1 tablet (1 mg total) by mouth daily. 30 tablet 0   furosemide  (LASIX ) 20 MG tablet Take 1 tablet (20 mg total) by mouth daily. 30 tablet 3   hydrOXYzine (ATARAX) 25 MG tablet Take 25 mg by mouth as needed for anxiety.     metoprolol  succinate (TOPROL -XL) 50 MG 24 hr tablet Take 1 tablet (50 mg total) by mouth daily. Take with or immediately following a meal. 30 tablet 2   sacubitril -valsartan  (ENTRESTO ) 49-51  MG Take 1 tablet by mouth 2 (two) times daily. 60 tablet 2   spironolactone  (ALDACTONE ) 25 MG tablet Take 1 tablet (25 mg total) by mouth daily. 30 tablet 3   triamcinolone  cream (KENALOG ) 0.5 % Apply 1 Application topically 2 (two) times daily.     No current facility-administered medications for this visit.     Past Medical History:  Diagnosis Date   Allergy    eczema, seasonal allergies   Asthma    Eczema    Essential hypertension    Obesity, Class III, BMI 40-49.9 (morbid obesity) 11/18/2020    Past Surgical History:  Procedure Laterality Date   INCISION AND DRAINAGE ABSCESS Left 02/04/2013   Procedure: INCISION AND DRAINAGE POSTERIOR NECK ABSCESS;  Surgeon: Lenton Rail, MD;  Location: [facility] OR;  Service: ENT;  Laterality: Left;   MOUTH SURGERY     Wisdom Teeth Extraction    Social History   Socioeconomic History   Marital status: Single    Spouse name: Not on file   Number of children: 0   Years of education: Not on file   Highest education level: High school graduate  Occupational History   Not on file  Tobacco Use   Smoking status: Never   Smokeless tobacco: Never  Vaping Use   Vaping status: Never Used  Substance and Sexual Activity   Alcohol use: No   Drug use: No  Sexual activity: Yes    Birth control/protection: Condom, None  Other Topics Concern   Not on file  Social History Narrative   Lives with boyfriend.  No pets.   Social Drivers of Corporate investment banker Strain: Low Risk  (07/08/2023)   Overall Financial Resource Strain (CARDIA)    Difficulty of Paying Living Expenses: Not very hard  Food Insecurity: No Food Insecurity (07/09/2023)   Hunger Vital Sign    Worried About Running Out of Food in the Last Year: Never true    Ran Out of Food in the Last Year: Never true  Transportation Needs: No Transportation Needs (07/08/2023)   PRAPARE - Administrator, Civil Service (Medical): No    Lack of Transportation (Non-Medical): No   Physical Activity: Not on file  Stress: Not on file  Social Connections: Not on file  Intimate Partner Violence: Not At Risk (07/09/2023)   Humiliation, Afraid, Rape, and Kick questionnaire    Fear of Current or Ex-Partner: No    Emotionally Abused: No    Physically Abused: No    Sexually Abused: No    Family History  Problem Relation Age of Onset   Hypertension Mother    Asthma Mother    Eczema Mother    Hypertension Father     ROS: no fevers or chills, productive cough, hemoptysis, dysphasia, odynophagia, melena, hematochezia, dysuria, hematuria, rash, seizure activity, orthopnea, PND, pedal edema, claudication. Remaining systems are negative.  Physical Exam: Well-developed well-nourished in no acute distress.  Skin is warm and dry.  HEENT is normal.  Neck is supple.  Chest is clear to auscultation with normal expansion.  Cardiovascular exam is regular rate and rhythm.  Abdominal exam nontender or distended. No masses palpated. Extremities show no edema. neuro grossly intact  ECG- personally reviewed  A/P  1 cardiomyopathy-etiology of LV dysfunction unclear.  During recent admission she was placed on Toprol , Entresto , Farxiga  and spironolactone .  Will arrange cardiac MRI to reassess LV function and rule out infiltrative process.  If LV dysfunction remains we will need CTA to rule out coronary disease.  2 pulmonary embolus-previously felt to be provoked by decreased mobility secondary to upper respiratory infection/obesity/reduced LV function.  She was also on norethindrone at home.  She was seen by pulmonary in follow-up on April 28 and anticoagulation was discontinued.  3 history of microcytic anemia-felt likely secondary to heavy menstruation.    4 hypertension-patient's blood pressure is controlled.  Continue present medications.  5 history of snoring-needs evaluation for sleep apnea.  Follow-up pulmonary.  Alexandria Angel, MD

## 2023-11-11 ENCOUNTER — Ambulatory Visit: Payer: BC Managed Care – PPO | Attending: Cardiology | Admitting: Cardiology

## 2024-04-13 ENCOUNTER — Ambulatory Visit: Admission: EM | Admit: 2024-04-13 | Discharge: 2024-04-13 | Disposition: A | Discharge status: Home / Self Care

## 2024-04-13 ENCOUNTER — Encounter: Payer: Self-pay | Admitting: Emergency Medicine

## 2024-04-13 DIAGNOSIS — B349 Viral infection, unspecified: Secondary | ICD-10-CM

## 2024-04-13 LAB — POC COVID19/FLU A&B COMBO
Covid Antigen, POC: NEGATIVE
Influenza A Antigen, POC: NEGATIVE
Influenza B Antigen, POC: NEGATIVE

## 2024-04-13 MED ORDER — CYCLOBENZAPRINE HCL 10 MG PO TABS: 10.0000 mg | ORAL_TABLET | Freq: Two times a day (BID) | ORAL | 0 refills | Status: AC | PRN

## 2024-04-13 MED ORDER — IBUPROFEN 400 MG PO TABS: 400.0000 mg | ORAL_TABLET | Freq: Three times a day (TID) | ORAL | 0 refills | Status: AC | PRN

## 2024-04-13 MED ORDER — ACETAMINOPHEN 325 MG PO TABS
650.0000 mg | ORAL_TABLET | Freq: Once | ORAL | Status: AC
  Administered 2024-04-13: 650 mg via ORAL

## 2024-04-13 MED ORDER — PROMETHAZINE-DM 6.25-15 MG/5ML PO SYRP: 5.0000 mL | ORAL_SOLUTION | Freq: Four times a day (QID) | ORAL | 0 refills | Status: AC | PRN

## 2024-04-13 MED ORDER — GUAIFENESIN ER 600 MG PO TB12: 1200.0000 mg | ORAL_TABLET | Freq: Two times a day (BID) | ORAL | 0 refills | Status: AC

## 2024-04-13 NOTE — ED Provider Notes (Signed)
 [name]    CSN: [id number] Arrival date & time: 04/13/24  1646      History   Chief Complaint Chief Complaint  Patient presents with   Chills   Headache   Generalized Body Aches    HPI Amber Arroyo is a 28 y.o. female.  2-3 day history of headache, body aches, fever and chills Some runny nose. A slight cough every now and then. Denies shortness of breath, wheezing, chest pain No sore throat, abd pain, NVD, rash Sick contacts -- works at [employer] tylenol  this morning, no meds last 6 hours   Asthma history   Had PE in January. On eliquis    Past Medical History:  Diagnosis Date   Allergy    eczema, seasonal allergies   Asthma    Eczema    Essential hypertension    Obesity, Class III, BMI 40-49.9 (morbid obesity) (HCC) 11/18/2020    Patient Active Problem List   Diagnosis Date Noted   Pulmonary embolus (HCC) 07/06/2023   Anemia 04/06/2023   Essential hypertension 04/04/2023   Allergy 04/04/2023   Change in vision 01/03/2021   Cellulitis of right leg 11/18/2020   Sepsis (HCC) 11/18/2020   Obesity, Class III, BMI 40-49.9 (morbid obesity) (HCC) 11/18/2020   Asthma    Cellulitis 07/03/2017   Seasonal allergies 02/10/2016   Eczema 01/07/2016   Obesity 01/07/2016   Benign essential hypertension 07/17/2013   Abscess of neck 02/04/2013    Past Surgical History:  Procedure Laterality Date   INCISION AND DRAINAGE ABSCESS Left 02/04/2013   Procedure: INCISION AND DRAINAGE POSTERIOR NECK ABSCESS;  Surgeon: Marlyce Finer, MD;  Location: [facility] OR;  Service: ENT;  Laterality: Left;   MOUTH SURGERY     Wisdom Teeth Extraction    OB History   No obstetric history on file.      Home Medications    Prior to Admission medications   Medication Sig Start Date End Date Taking? Authorizing Provider  acetaminophen  (TYLENOL ) 500 MG tablet Take 1,000 mg by mouth every 6 (six) hours as needed for mild pain (pain score 1-3) or moderate pain (pain score 4-6).    Yes [provider]  cabergoline (DOSTINEX) 0.5 MG tablet 1 tablet Orally Once a week; Duration: 30 day(s) 09/26/20  Yes [provider]  cyclobenzaprine (FLEXERIL) 10 MG tablet Take 1 tablet (10 mg total) by mouth 2 (two) times daily as needed for muscle spasms. 04/13/24  Yes Senan Urey, [place], PA-C  guaiFENesin  (MUCINEX ) 600 MG 12 hr tablet Take 2 tablets (1,200 mg total) by mouth 2 (two) times daily. 04/13/24  Yes Missie Gehrig, [place], PA-C  ibuprofen  (ADVIL ) 400 MG tablet Take 1 tablet (400 mg total) by mouth every 8 (eight) hours as needed. 04/13/24  Yes Lineth Thielke, [place], PA-C  promethazine -dextromethorphan  (PROMETHAZINE -DM) 6.25-15 MG/5ML syrup Take 5 mLs by mouth 4 (four) times daily as needed for cough. 04/13/24  Yes Victor Langenbach, [place], PA-C  albuterol  (VENTOLIN  HFA) 108 (90 Base) MCG/ACT inhaler Inhale 2 puffs into the lungs every 4 (four) hours as needed for wheezing or shortness of breath. 06/29/23   Arloa Suzen RAMAN, NP  apixaban  (ELIQUIS ) 5 MG TABS tablet Take 1 tablet (5 mg total) by mouth 2 (two) times daily. 08/09/23   Hayes Beckey CROME, NP  azelastine (OPTIVAR) 0.05 % ophthalmic solution 1 drop.    [provider]  betamethasone, augmented, (DIPROLENE) 0.05 % lotion APPLY A SMALL AMOUNT TO THE SCALP ONCE A DAY    [provider]  cetirizine  (ZYRTEC ) 10 MG tablet     [provider]  empagliflozin  (JARDIANCE ) 10 MG TABS tablet Take 1 tablet (10 mg total) by mouth daily. 08/09/23   Hayes Beckey CROME, NP  EPINEPHrine  0.3 mg/0.3 mL IJ SOAJ injection Inject 0.3 mg into the muscle See admin instructions. Patient taking differently: Inject 0.3 mg into the muscle as needed for anaphylaxis. 04/08/23   Ghimire, Kuber, MD  FEROSUL 325 (65 Fe) MG tablet Take 325 mg by mouth daily. 12/14/22   [provider]  folic acid  (FOLVITE ) 1 MG tablet Take 1 tablet (1 mg total) by mouth daily. 07/10/23   Dennise Lavada POUR, MD  furosemide  (LASIX ) 20 MG tablet Take 1 tablet (20  mg total) by mouth daily. 08/09/23   Hayes Beckey CROME, NP  hydrOXYzine (ATARAX) 25 MG tablet Take 25 mg by mouth as needed for anxiety. 03/31/23   [provider]  metoprolol  succinate (TOPROL -XL) 50 MG 24 hr tablet Take 1 tablet (50 mg total) by mouth daily. Take with or immediately following a meal. 08/09/23   Hayes Beckey CROME, NP  sacubitril -valsartan  (ENTRESTO ) 49-51 MG Take 1 tablet by mouth 2 (two) times daily. 08/09/23   Hayes Beckey CROME, NP  spironolactone  (ALDACTONE ) 25 MG tablet Take 1 tablet (25 mg total) by mouth daily. 07/20/23   Clegg, Amy D, NP  triamcinolone  cream (KENALOG ) 0.5 % Apply 1 Application topically 2 (two) times daily. 04/15/23   [provider]  Vitamin D , Ergocalciferol , (DRISDOL ) 1.25 MG (50000 UNIT) CAPS capsule Take 1 capsule by mouth once a week.    [provider]    Family History Family History  Problem Relation Age of Onset   Hypertension Mother    Asthma Mother    Eczema Mother    Hypertension Father     Social History Social History   Tobacco Use   Smoking status: Never    Passive exposure: Never   Smokeless tobacco: Never  Vaping Use   Vaping status: Never Used  Substance Use Topics   Alcohol use: No   Drug use: No     Allergies   Fish protein-containing drug products, Shellfish protein-containing drug products, and Tree extract   Review of Systems Review of Systems As per HPI  Physical Exam Triage Vital Signs ED Triage Vitals  Encounter Vitals Group     BP 04/13/24 1756 138/86     Girls Systolic BP Percentile --      Girls Diastolic BP Percentile --      Boys Systolic BP Percentile --      Boys Diastolic BP Percentile --      Pulse Rate 04/13/24 1756 (!) 110     Resp 04/13/24 1756 20     Temp 04/13/24 1756 (!) 100.4 F (38 C)     Temp Source 04/13/24 1756 Oral     SpO2 04/13/24 1756 98 %     Weight 04/13/24 1756 278 lb 14.1 oz (126.5 kg)     Height --      Head Circumference --      Peak Flow --      Pain  Score 04/13/24 1754 8     Pain Loc --      Pain Education --      Exclude from Growth Chart --    No data found.  Updated Vital Signs BP 138/86 (BP Location: Left Arm)   Pulse (!) 102   Temp 98.8 F (37.1 C)   Resp  20   Wt 278 lb 14.1 oz (126.5 kg)   LMP  (LMP Unknown)   SpO2 98%   BMI 49.40 kg/m    Physical Exam Vitals and nursing note reviewed.  Constitutional:      General: She is not in acute distress. HENT:     Right Ear: Tympanic membrane and ear canal normal.     Left Ear: Tympanic membrane and ear canal normal.     Nose: Congestion and rhinorrhea present.     Mouth/Throat:     Mouth: Mucous membranes are moist.     Pharynx: Uvula midline. No oropharyngeal exudate or posterior oropharyngeal erythema.     Tonsils: No tonsillar exudate or tonsillar abscesses.  Eyes:     Conjunctiva/sclera: Conjunctivae normal.     Pupils: Pupils are equal, round, and reactive to light.  Cardiovascular:     Rate and Rhythm: Normal rate and regular rhythm.     Heart sounds: Normal heart sounds.  Pulmonary:     Effort: Pulmonary effort is normal. No respiratory distress.     Breath sounds: Normal breath sounds. No wheezing.  Abdominal:     General: Bowel sounds are normal.     Palpations: Abdomen is soft.     Tenderness: There is no abdominal tenderness.  Musculoskeletal:     Cervical back: Normal range of motion. No rigidity or tenderness.  Lymphadenopathy:     Cervical: No cervical adenopathy.  Neurological:     Mental Status: She is alert and oriented to person, place, and time.     UC Treatments / Results  Labs (all labs ordered are listed, but only abnormal results are displayed) Labs Reviewed  POC COVID19/FLU A&B COMBO - Normal    EKG   Radiology No results found.  Procedures Procedures (including critical care time)  Medications Ordered in UC Medications  acetaminophen  (TYLENOL ) tablet 650 mg (650 mg Oral Given 04/13/24 1802)    Initial Impression /  Assessment and Plan / UC Course  I have reviewed the triage vital signs and the nursing notes.  Pertinent labs & imaging results that were available during my care of the patient were reviewed by me and considered in my medical decision making (see chart for details).  100.4 temp on arrival and mildly tachycardic, improved after tylenol  and oral fluids in clinic. Patient feeling improved.  Rapid covid and flu negative Discussed viral etiology, supportive care If tylenol  alone does not reduce fever or pain, can use occasional ibuprofen  dose. Discussed this would be safe with eliquis  in low dose, infrequent use. Also try muscle relaxer - drowsy precautions. Return and ED precautions are verbalized. Agrees to plan, no questions  Final Clinical Impressions(s) / UC Diagnoses   Final diagnoses:  Viral illness     Discharge Instructions      You likely have a viral respiratory illness.  This is treated with supportive care. I recommend plain guaifenesin  (Mucinex )f or congestion. Drink lots of water and fluids!  Continue tylenol  for fever and pain For breakthrough pain or high fever you can safely use occasional ibuprofen .   You can take the muscle relaxer Flexeril twice daily. If the medication makes you drowsy, take only at bed time.  The promethazine  DM cough syrup can be used up to 4 times daily. If this medication makes you drowsy, take only once before bed.  Allow 4-5 days for improvement   You can return to work only if 24 hours without fever!    ED  Prescriptions     Medication Sig Dispense Auth. Provider   ibuprofen  (ADVIL ) 400 MG tablet Take 1 tablet (400 mg total) by mouth every 8 (eight) hours as needed. 10 tablet Syvanna Ciolino, PA-C   cyclobenzaprine (FLEXERIL) 10 MG tablet Take 1 tablet (10 mg total) by mouth 2 (two) times daily as needed for muscle spasms. 20 tablet Damare Serano, PA-C   promethazine -dextromethorphan  (PROMETHAZINE -DM) 6.25-15 MG/5ML syrup Take 5 mLs  by mouth 4 (four) times daily as needed for cough. 240 mL Zlata Alcaide, PA-C   guaiFENesin  (MUCINEX ) 600 MG 12 hr tablet Take 2 tablets (1,200 mg total) by mouth 2 (two) times daily. 30 tablet Daviana Haymaker, [place], PA-C      PDMP not reviewed this encounter.   Quincee Gittens, PA-C 04/13/24 2121

## 2024-04-13 NOTE — ED Triage Notes (Signed)
 Pt presents c/o URI x 3 days. Pt states,  The lasyt 2 or 3 days I been having bad migraines from my head to the back of my neck. My nose been running and I have had the chills and body aches.  Pt denies emesis and diarrhea.

## 2024-04-13 NOTE — Discharge Instructions (Addendum)
 You likely have a viral respiratory illness.  This is treated with supportive care. I recommend plain guaifenesin  (Mucinex )f or congestion. Drink lots of water and fluids!  Continue tylenol  for fever and pain For breakthrough pain or high fever you can safely use occasional ibuprofen .   You can take the muscle relaxer Flexeril twice daily. If the medication makes you drowsy, take only at bed time.  The promethazine  DM cough syrup can be used up to 4 times daily. If this medication makes you drowsy, take only once before bed.  Allow 4-5 days for improvement   You can return to work only if 24 hours without fever!
